# Patient Record
Sex: Female | Born: 1945 | Race: White | Hispanic: No | Smoking: Current every day smoker
Health system: Southern US, Community
[De-identification: ages and names within clinical notes are randomized; demographics above are authoritative.]

## PROBLEM LIST (undated history)

## (undated) DIAGNOSIS — M1710 Unilateral primary osteoarthritis, unspecified knee: Secondary | ICD-10-CM

## (undated) DIAGNOSIS — R51 Headache: Secondary | ICD-10-CM

## (undated) DIAGNOSIS — M179 Osteoarthritis of knee, unspecified: Secondary | ICD-10-CM

## (undated) DIAGNOSIS — C50919 Malignant neoplasm of unspecified site of unspecified female breast: Secondary | ICD-10-CM

## (undated) DIAGNOSIS — J189 Pneumonia, unspecified organism: Secondary | ICD-10-CM

## (undated) DIAGNOSIS — J449 Chronic obstructive pulmonary disease, unspecified: Secondary | ICD-10-CM

## (undated) DIAGNOSIS — L259 Unspecified contact dermatitis, unspecified cause: Secondary | ICD-10-CM

## (undated) DIAGNOSIS — R519 Headache, unspecified: Secondary | ICD-10-CM

## (undated) DIAGNOSIS — J439 Emphysema, unspecified: Secondary | ICD-10-CM

## (undated) DIAGNOSIS — Z9981 Dependence on supplemental oxygen: Secondary | ICD-10-CM

## (undated) HISTORY — PX: KNEE SURGERY: SHX244

## (undated) HISTORY — DX: Unspecified contact dermatitis, unspecified cause: L25.9

## (undated) HISTORY — PX: BREAST SURGERY: SHX581

## (undated) HISTORY — PX: COLONOSCOPY: SHX174

## (undated) HISTORY — DX: Malignant neoplasm of unspecified site of unspecified female breast: C50.919

## (undated) HISTORY — DX: Emphysema, unspecified: J43.9

## (undated) HISTORY — PX: EYE SURGERY: SHX253

## (undated) HISTORY — DX: Chronic obstructive pulmonary disease, unspecified: J44.9

## (undated) HISTORY — PX: TONSILLECTOMY: SUR1361

---

## 2016-09-27 DIAGNOSIS — J449 Chronic obstructive pulmonary disease, unspecified: Secondary | ICD-10-CM | POA: Diagnosis not present

## 2016-10-11 DIAGNOSIS — G4700 Insomnia, unspecified: Secondary | ICD-10-CM | POA: Diagnosis not present

## 2016-10-11 DIAGNOSIS — R600 Localized edema: Secondary | ICD-10-CM | POA: Diagnosis not present

## 2016-10-11 DIAGNOSIS — R202 Paresthesia of skin: Secondary | ICD-10-CM | POA: Diagnosis not present

## 2016-10-11 DIAGNOSIS — J449 Chronic obstructive pulmonary disease, unspecified: Secondary | ICD-10-CM | POA: Diagnosis not present

## 2016-10-11 DIAGNOSIS — L989 Disorder of the skin and subcutaneous tissue, unspecified: Secondary | ICD-10-CM | POA: Diagnosis not present

## 2016-10-13 ENCOUNTER — Other Ambulatory Visit (HOSPITAL_COMMUNITY): Payer: Self-pay | Admitting: Family Medicine

## 2016-10-13 DIAGNOSIS — IMO0002 Reserved for concepts with insufficient information to code with codable children: Secondary | ICD-10-CM

## 2016-10-13 DIAGNOSIS — R229 Localized swelling, mass and lump, unspecified: Principal | ICD-10-CM

## 2016-10-25 ENCOUNTER — Encounter (HOSPITAL_COMMUNITY): Payer: Self-pay

## 2016-10-25 ENCOUNTER — Ambulatory Visit (HOSPITAL_COMMUNITY)
Admission: RE | Admit: 2016-10-25 | Discharge: 2016-10-25 | Disposition: A | Discharge status: Home / Self Care | Payer: Medicare Other | Source: Ambulatory Visit | Attending: Family Medicine | Admitting: Family Medicine

## 2016-10-25 ENCOUNTER — Ambulatory Visit: Payer: Self-pay | Admitting: Family Medicine

## 2016-10-25 DIAGNOSIS — N6323 Unspecified lump in the left breast, lower outer quadrant: Secondary | ICD-10-CM | POA: Diagnosis not present

## 2016-10-25 DIAGNOSIS — R229 Localized swelling, mass and lump, unspecified: Secondary | ICD-10-CM

## 2016-10-25 DIAGNOSIS — N6310 Unspecified lump in the right breast, unspecified quadrant: Secondary | ICD-10-CM | POA: Diagnosis present

## 2016-10-25 DIAGNOSIS — R921 Mammographic calcification found on diagnostic imaging of breast: Secondary | ICD-10-CM | POA: Insufficient documentation

## 2016-10-25 DIAGNOSIS — R922 Inconclusive mammogram: Secondary | ICD-10-CM | POA: Diagnosis not present

## 2016-10-25 DIAGNOSIS — R928 Other abnormal and inconclusive findings on diagnostic imaging of breast: Secondary | ICD-10-CM | POA: Diagnosis present

## 2016-10-25 DIAGNOSIS — IMO0002 Reserved for concepts with insufficient information to code with codable children: Secondary | ICD-10-CM

## 2016-10-25 DIAGNOSIS — N6320 Unspecified lump in the left breast, unspecified quadrant: Secondary | ICD-10-CM | POA: Diagnosis present

## 2016-10-28 DIAGNOSIS — J449 Chronic obstructive pulmonary disease, unspecified: Secondary | ICD-10-CM | POA: Diagnosis not present

## 2016-11-01 ENCOUNTER — Encounter (HOSPITAL_COMMUNITY): Payer: Self-pay

## 2016-11-04 ENCOUNTER — Encounter: Payer: Self-pay | Admitting: Physician Assistant

## 2016-11-04 ENCOUNTER — Ambulatory Visit (INDEPENDENT_AMBULATORY_CARE_PROVIDER_SITE_OTHER): Payer: Medicare Other | Admitting: Physician Assistant

## 2016-11-04 VITALS — BP 133/83 | HR 76 | Temp 97.2°F | Ht 65.5 in | Wt 165.0 lb

## 2016-11-04 DIAGNOSIS — E7800 Pure hypercholesterolemia, unspecified: Secondary | ICD-10-CM | POA: Diagnosis not present

## 2016-11-04 DIAGNOSIS — Z720 Tobacco use: Secondary | ICD-10-CM

## 2016-11-04 DIAGNOSIS — R0902 Hypoxemia: Secondary | ICD-10-CM

## 2016-11-04 DIAGNOSIS — L2084 Intrinsic (allergic) eczema: Secondary | ICD-10-CM | POA: Diagnosis not present

## 2016-11-04 DIAGNOSIS — J449 Chronic obstructive pulmonary disease, unspecified: Secondary | ICD-10-CM | POA: Diagnosis not present

## 2016-11-04 NOTE — Patient Instructions (Signed)
In a few days you may receive a survey in the mail or online from [employer] regarding your visit with us today. Please take a moment to fill this out. Your feedback is very important to our whole office. It can help us better understand your needs as well as improve your experience and satisfaction. Thank you for taking your time to complete it. We care about you.  Elexius Minar, PA-C  

## 2016-11-08 ENCOUNTER — Encounter: Payer: Self-pay | Admitting: Physician Assistant

## 2016-11-08 DIAGNOSIS — J449 Chronic obstructive pulmonary disease, unspecified: Secondary | ICD-10-CM | POA: Insufficient documentation

## 2016-11-08 DIAGNOSIS — R0902 Hypoxemia: Secondary | ICD-10-CM | POA: Insufficient documentation

## 2016-11-08 DIAGNOSIS — E7800 Pure hypercholesterolemia, unspecified: Secondary | ICD-10-CM | POA: Insufficient documentation

## 2016-11-08 DIAGNOSIS — Z720 Tobacco use: Secondary | ICD-10-CM | POA: Insufficient documentation

## 2016-11-08 NOTE — Progress Notes (Signed)
BP 133/83   Pulse 76   Temp (!) 97.2 F (36.2 C) (Oral)   Ht 5' 5.5" (1.664 m)   Wt 165 lb (74.8 kg)   SpO2 93%   BMI 27.04 kg/m    Subjective:    Patient ID: Joanne Kramer, female    DOB: 1945/05/21, 71 y.o.   MRN: [id number]  HPI: Joanne Kramer is a 71 y.o. female presenting on 11/04/2016 for New Patient (Initial Visit)   this patient comes in to be established as a new patient. She is permanently moved to the area. Her past medical history is positive for COPD, hyperlipidemia, hypoxia with oxygen use at home, tobacco abuse,eczema. All of her medications are reviewed today. She reports that she had a colo at age 63. At this time she still smokes 1 pack per day. She is uncertain of her last pneumonia vaccine,  We will request records to be sent. She had cataract removal in 2018. She receives oxygen through [facility]. 6 months ago she had to have a right breast diagnostic mammo that was found to be normal. She was supposed to have it repeated in 6 months.  Relevant past medical, surgical, family and social history reviewed and updated as indicated. Allergies and medications reviewed and updated.  Past Medical History:  Diagnosis Date  . Cataract   . COPD (chronic obstructive pulmonary disease) ([place])   . Emphysema of lung [facility])     Past Surgical History:  Procedure Laterality Date  . CESAREAN SECTION    . EYE SURGERY    . KNEE SURGERY Right     Review of Systems  Constitutional: Positive for fatigue. Negative for activity change and fever.  HENT: Negative.   Eyes: Negative.   Respiratory: Positive for cough, shortness of breath and wheezing.   Cardiovascular: Negative.  Negative for chest pain, palpitations and leg swelling.  Gastrointestinal: Negative.  Negative for abdominal pain.  Endocrine: Negative.   Genitourinary: Negative.  Negative for dysuria.  Musculoskeletal: Negative.   Skin: Negative.   Neurological: Negative.     Allergies as of 11/04/2016   No Known  Allergies     Medication List       Accurate as of 11/04/16 11:59 PM. Always use your most recent med list.          ADVAIR DISKUS 250-50 MCG/DOSE Aepb Generic drug:  Fluticasone-Salmeterol Inhale 1 puff into the lungs 2 (two) times daily.   calcium carbonate 750 MG chewable tablet Commonly known as:  TUMS EX Chew 1 tablet by mouth daily.   COMBIVENT RESPIMAT 20-100 MCG/ACT Aers respimat Generic drug:  Ipratropium-Albuterol Inhale 1 puff into the lungs every 6 (six) hours.   Melatonin 5 MG Tabs Take by mouth.   multivitamin tablet Take 1 tablet by mouth daily.   naproxen sodium 220 MG tablet Commonly known as:  ANAPROX Take 220 mg by mouth 2 (two) times daily with a meal.   OVER THE COUNTER MEDICATION Calcium 1200 mg and Vitamin D3 1000 units daily   OXYGEN Inhale 2 L into the lungs at bedtime.   simvastatin 20 MG tablet Commonly known as:  ZOCOR Take 20 mg by mouth daily.   triamcinolone cream 0.1 % Commonly known as:  KENALOG Apply 1 application topically 2 (two) times daily.          Objective:    BP 133/83   Pulse 76   Temp (!) 97.2 F (36.2 C) (Oral)   Ht 5' 5.5" (1.664 m)  Wt 165 lb (74.8 kg)   SpO2 93%   BMI 27.04 kg/m   No Known Allergies  Physical Exam  Constitutional: She is oriented to person, place, and time. She appears well-developed and well-nourished.  HENT:  Head: Normocephalic and atraumatic.  Right Ear: Tympanic membrane, external ear and ear canal normal.  Left Ear: Tympanic membrane, external ear and ear canal normal.  Nose: Nose normal. No rhinorrhea.  Mouth/Throat: Oropharynx is clear and moist and mucous membranes are normal. No oropharyngeal exudate or posterior oropharyngeal erythema.  Eyes: Pupils are equal, round, and reactive to light. Conjunctivae and EOM are normal.  Neck: Normal range of motion. Neck supple.  Cardiovascular: Normal rate, regular rhythm, normal heart sounds and intact distal pulses.     Pulmonary/Chest: Effort normal and breath sounds normal.  Abdominal: Soft. Bowel sounds are normal.  Neurological: She is alert and oriented to person, place, and time. She has normal reflexes.  Skin: Skin is warm and dry. No rash noted.  Psychiatric: She has a normal mood and affect. Her behavior is normal. Judgment and thought content normal.  Nursing note and vitals reviewed.   No results found for this or any previous visit.    Assessment & Plan:   1. Chronic obstructive pulmonary disease, unspecified COPD type (HCC) - Fluticasone-Salmeterol (ADVAIR DISKUS) 250-50 MCG/DOSE AEPB; Inhale 1 puff into the lungs 2 (two) times daily. - Ipratropium-Albuterol (COMBIVENT RESPIMAT) 20-100 MCG/ACT AERS respimat; Inhale 1 puff into the lungs every 6 (six) hours. - OXYGEN; Inhale 2 L into the lungs at bedtime.  2. Pure hypercholesterolemia - simvastatin (ZOCOR) 20 MG tablet; Take 20 mg by mouth daily.  3. Hypoxia  4. Tobacco abuse  5. Intrinsic eczema - triamcinolone cream (KENALOG) 0.1 %; Apply 1 application topically 2 (two) times daily.    Current Outpatient Prescriptions:  .  calcium carbonate (TUMS EX) 750 MG chewable tablet, Chew 1 tablet by mouth daily., Disp: , Rfl:  .  Fluticasone-Salmeterol (ADVAIR DISKUS) 250-50 MCG/DOSE AEPB, Inhale 1 puff into the lungs 2 (two) times daily., Disp: , Rfl:  .  Ipratropium-Albuterol (COMBIVENT RESPIMAT) 20-100 MCG/ACT AERS respimat, Inhale 1 puff into the lungs every 6 (six) hours., Disp: , Rfl:  .  Melatonin 5 MG TABS, Take by mouth., Disp: , Rfl:  .  Multiple Vitamin (MULTIVITAMIN) tablet, Take 1 tablet by mouth daily., Disp: , Rfl:  .  naproxen sodium (ANAPROX) 220 MG tablet, Take 220 mg by mouth 2 (two) times daily with a meal., Disp: , Rfl:  .  OVER THE COUNTER MEDICATION, Calcium 1200 mg and Vitamin D3 1000 units daily, Disp: , Rfl:  .  OXYGEN, Inhale 2 L into the lungs at bedtime., Disp: , Rfl:  .  simvastatin (ZOCOR) 20 MG tablet,  Take 20 mg by mouth daily., Disp: , Rfl:  .  triamcinolone cream (KENALOG) 0.1 %, Apply 1 application topically 2 (two) times daily., Disp: , Rfl:  Continue all other maintenance medications as listed above.  Follow up plan: Return in about 3 months (around 02/03/2017) for recheck.  Educational handout given for [place] PA-C [facility] [address]  [place], [place] [address] [phone]   11/08/2016, 1:47 PM

## 2016-11-23 ENCOUNTER — Encounter: Payer: Self-pay | Admitting: *Deleted

## 2016-11-23 ENCOUNTER — Ambulatory Visit (INDEPENDENT_AMBULATORY_CARE_PROVIDER_SITE_OTHER): Payer: Medicare Other | Admitting: *Deleted

## 2016-11-23 DIAGNOSIS — Z23 Encounter for immunization: Secondary | ICD-10-CM | POA: Diagnosis not present

## 2016-11-23 DIAGNOSIS — Z0000 Encounter for general adult medical examination without abnormal findings: Secondary | ICD-10-CM

## 2016-11-23 NOTE — Progress Notes (Signed)
Subjective:   Joanne Kramer is a 71 y.o. female who presents for an Initial Medicare Annual Wellness Visit.  Joanne Kramer moved to [place] with her significant other from [place] 3 months ago to avoid the harsh winters.  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  She enjoys reading and working in her yard.  She has COPD and has not been able to do any exercise since moving here due to the heat and humidity.  She lives at home with her significant other. She has a daughter who lives in [place] and another daughter who lives in [place].   Review of Systems    All systems negative today        Objective:    Today's Vitals   11/23/16 1734  BP: 120/82  Pulse: 72  Weight: 165 lb (74.8 kg)  Height: 5' 5.5" (1.664 m)  PainSc: 0-No pain   Body mass index is 27.04 kg/m.   Current Medications (verified) Outpatient Encounter Prescriptions as of 11/23/2016  Medication Sig  . calcium carbonate (TUMS EX) 750 MG chewable tablet Chew 1 tablet by mouth daily.  . Fluticasone-Salmeterol (ADVAIR DISKUS) 250-50 MCG/DOSE AEPB Inhale 1 puff into the lungs 2 (two) times daily.  . Ipratropium-Albuterol (COMBIVENT RESPIMAT) 20-100 MCG/ACT AERS respimat Inhale 1 puff into the lungs every 6 (six) hours.  . Melatonin 5 MG TABS Take by mouth.  . Multiple Vitamin (MULTIVITAMIN) tablet Take 1 tablet by mouth daily.  . naproxen sodium (ANAPROX) 220 MG tablet Take 220 mg by mouth 2 (two) times daily with a meal.  . OVER THE COUNTER MEDICATION Calcium 1200 mg and Vitamin D3 1000 units daily  . OXYGEN Inhale 2 L into the lungs at bedtime.  . simvastatin (ZOCOR) 20 MG tablet Take 20 mg by mouth daily.  Marland Kitchen triamcinolone cream (KENALOG) 0.1 % Apply 1 application topically 2 (two) times daily.   No facility-administered encounter medications on file as of 11/23/2016.     Allergies (verified) Patient has no known allergies.   History: Past Medical History:  Diagnosis Date  .  Cataract   . COPD (chronic obstructive pulmonary disease) ([place])   . Emphysema of lung [facility])    Past Surgical History:  Procedure Laterality Date  . CESAREAN SECTION    . EYE SURGERY    . KNEE SURGERY Right    Family History  Problem Relation Age of Onset  . Adopted: Yes   Social History   Occupational History  . Not on file.   Social History Main Topics  . Smoking status: Current Some Day Smoker    Types: Cigarettes  . Smokeless tobacco: Never Used  . Alcohol use No  . Drug use: No  . Sexual activity: Not Currently      Activities of Daily Living In your present state of health, do you have any difficulty performing the following activities: 11/23/2016  Hearing? N  Vision? N  Difficulty concentrating or making decisions? N  Walking or climbing stairs? Y  Comment climbing stairs causes knee pain  Dressing or bathing? N  Doing errands, shopping? N    Immunizations and Health Maintenance Immunization History  Administered Date(s) Administered  . Influenza, High Dose Seasonal PF 11/23/2016   Health Maintenance Due  Topic Date Due  . Hepatitis C Screening  10/29/1945  . TETANUS/TDAP  05/08/1966  . COLONOSCOPY  05/07/1997  . DEXA SCAN  05/07/2012  . PNA vac Low Risk Adult (1 of  2 - PCV13) 05/07/2012  . INFLUENZA VACCINE  09/28/2016    Patient Care Team: Theodoro Clock as PCP - General (Physician Assistant)       Assessment:   This is a routine wellness examination for Joanne Kramer.   Hearing/Vision screen  Had bilateral cataracts removed this year plans to go to [facility] when eye exam is due No hearing deficits noted  Dietary issues and exercise activities discussed: Current Exercise Habits: The patient does not participate in regular exercise at present  Goals    . Exercise 3x per week (30 min per time)          Join [employer] or [employer] recreation department and work out at least 3 days per week for 30 minutes        Depression Screen PHQ 2/9 Scores 11/23/2016 11/04/2016  PHQ - 2 Score 0 0    Fall Risk Fall Risk  11/23/2016 11/04/2016  Falls in the past year? No No    Cognitive Function: MMSE - Mini Mental State Exam 11/23/2016  Orientation to time 5  Orientation to Place 5  Registration 3  Attention/ Calculation 5  Recall 3  Language- name 2 objects 2  Language- repeat 1  Language- follow 3 step command 3  Language- read & follow direction 1  Write a sentence 1  Copy design 1  Total score 30        Screening Tests Health Maintenance  Topic Date Due  . Hepatitis C Screening  04/16/1945  . TETANUS/TDAP  05/08/1966  . COLONOSCOPY  05/07/1997  . DEXA SCAN  05/07/2012  . PNA vac Low Risk Adult (1 of 2 - PCV13) 05/07/2012  . INFLUENZA VACCINE  09/28/2016  . MAMMOGRAM  10/26/2018   High Dose Influenza vaccine given today  Patient states she has requested for all of her medical records from [place] to be sent to our office.  Advised that once they are received they will be reviewed by Particia Nearing PA and sent to be scanning into her electronic medical record.    Plan:      Visit [place] [employer] or [employer] to find exercise groups - [employer] has a group called [place]   Work on increasing exercise to at least 3 times per week to 30 minutes per session - walking, riding stationary bike or recumbent stepper, or group exercise classes are good options  Eat a diet consisting mostly of lean proteins, vegetables, fruits, and whole grains.     I have personally reviewed and noted the following in the patient's chart:   . Medical and social history . Use of alcohol, tobacco or illicit drugs  . Current medications and supplements . Functional ability and status . Nutritional status . Physical activity . Advanced directives . List of other physicians . Hospitalizations, surgeries, and ER visits in previous 12 months . Vitals . Screenings to include cognitive,  depression, and falls . Referrals and appointments  In addition, I have reviewed and discussed with patient certain preventive protocols, quality metrics, and best practice recommendations. A written personalized care plan for preventive services as well as general preventive health recommendations were provided to patient.     WYATT, AMY M, RN   11/23/2016    I have reviewed and agree with the above AWV documentation.   Terald Sleeper PA-C [facility] [address]  [place], [place] [address] [phone]

## 2016-11-23 NOTE — Patient Instructions (Signed)
  Ms. Hulsey , Thank you for taking time to come for your Medicare Wellness Visit. I appreciate your ongoing commitment to your health goals. Please review the following plan we discussed and let me know if I can assist you in the future.   These are the goals we discussed:   Visit [place] [employer] or [employer] to find exercise groups - [employer] has a group called [employer]   Work on increasing exercise to at least 3 times per week to 30 minutes per session - walking, riding stationary bike or recumbent stepper are good options  I encourage you to eat a diet consisting mostly of lean proteins, vegetables, fruits, and whole grains.    Thank you for coming in for your annual wellness visit today!  Nolberto Hanlon, RN

## 2016-11-27 DIAGNOSIS — J449 Chronic obstructive pulmonary disease, unspecified: Secondary | ICD-10-CM | POA: Diagnosis not present

## 2016-12-28 DIAGNOSIS — J449 Chronic obstructive pulmonary disease, unspecified: Secondary | ICD-10-CM | POA: Diagnosis not present

## 2017-01-27 DIAGNOSIS — J449 Chronic obstructive pulmonary disease, unspecified: Secondary | ICD-10-CM | POA: Diagnosis not present

## 2017-02-06 ENCOUNTER — Ambulatory Visit: Payer: Medicare Other | Admitting: Physician Assistant

## 2017-02-15 ENCOUNTER — Ambulatory Visit (INDEPENDENT_AMBULATORY_CARE_PROVIDER_SITE_OTHER): Payer: Medicare Other | Admitting: Physician Assistant

## 2017-02-15 ENCOUNTER — Encounter: Payer: Self-pay | Admitting: Physician Assistant

## 2017-02-15 VITALS — BP 125/77 | HR 86 | Temp 97.4°F | Ht 65.5 in | Wt 166.2 lb

## 2017-02-15 DIAGNOSIS — R519 Headache, unspecified: Secondary | ICD-10-CM

## 2017-02-15 DIAGNOSIS — G5732 Lesion of lateral popliteal nerve, left lower limb: Secondary | ICD-10-CM | POA: Diagnosis not present

## 2017-02-15 DIAGNOSIS — J449 Chronic obstructive pulmonary disease, unspecified: Secondary | ICD-10-CM

## 2017-02-15 DIAGNOSIS — E7800 Pure hypercholesterolemia, unspecified: Secondary | ICD-10-CM | POA: Diagnosis not present

## 2017-02-15 DIAGNOSIS — M1711 Unilateral primary osteoarthritis, right knee: Secondary | ICD-10-CM | POA: Diagnosis not present

## 2017-02-15 DIAGNOSIS — M1712 Unilateral primary osteoarthritis, left knee: Secondary | ICD-10-CM

## 2017-02-15 DIAGNOSIS — R51 Headache: Secondary | ICD-10-CM

## 2017-02-15 DIAGNOSIS — Z0000 Encounter for general adult medical examination without abnormal findings: Secondary | ICD-10-CM

## 2017-02-15 DIAGNOSIS — M24541 Contracture, right hand: Secondary | ICD-10-CM

## 2017-02-15 NOTE — Patient Instructions (Signed)
Peroneal Tendinopathy Peroneal tendinopathy is irritation of the tendons that pass behind your ankle (peroneal tendons). These tendons attach muscles in your foot to a bone on the side of your foot and underneath the arch of your foot. This condition can cause your peroneal tendons to get bigger and swell. What are the causes? This condition may be caused by:  Putting stress on your ankle over and over again (overuse injury).  A sudden injury that puts stress on your tendons, such as an ankle sprain.  What increases the risk? This condition is more likely to develop in:  People who have high arches.  Athletes who play sports that involve putting stress on the ankle over and over again. These sports include: ? Running. ? Dancing. ? Soccer. ? Basketball.  What are the signs or symptoms? Symptoms of this condition can start suddenly or develop gradually. Symptoms include:  Pain in the back of the ankle, on the side of the foot, or in the arch of the foot.  Pain that gets worse with activity and better with rest.  Swelling.  Warmth.  Weakness in your foot or ankle.  How is this diagnosed? This condition may be diagnosed based on:  Your symptoms.  Your medical history.  A physical exam.  Imaging tests, such as: ? An X-ray or CT scan to check for bone injury. ? MRI or ultrasound to check for muscle or tendon injury.  During your physical exam, your health care provider may move your foot and ankle and test the strength of your leg muscles. How is this treated? This condition may be treated by:  Keeping your body weight off your ankle for several days.  Returning gradually to full activity gradually.  Putting ice on your ankle to reduce swelling.  Taking an anti-inflammatory pain medicine (NSAID).  Having medicine injected into your tendon to reduce swelling.  Wearing a removable boot or brace for ankle support.  Doing range-of-motion exercises and strengthening  exercises (physical therapy) when pain and swelling improve.  If the condition does not improve with treatment, or if a tendon or muscle is damaged, surgery may be needed. Follow these instructions at home: If you have a boot or brace:  Wear it as told by your health care provider. Remove it only as told by your health care provider.  Loosen it if your toes tingle, become numb, or turn cold and blue.  Do not let it get wet if it is not waterproof.  Keep it clean. Managing pain, stiffness, and swelling  If directed, apply ice to the injured area: ? Put ice in a plastic bag. ? Place a towel between your skin and the bag. ? Leave the ice on for 20 minutes, 2-3 times a day.  Take over-the-counter and prescription medicines only as told by your health care provider.  Raise (elevate) your ankle above the level of your heart when resting if you have swelling. Activity  Do not use your ankle to support (bear) your full body weight until your health care provider says that you can.  Do not do activities that make pain or swelling worse.  Return to your normal activities as told by your health care provider. General instructions  Keep all follow-up visits as told by your health care provider. This is important. How is this prevented?  Wear supportive footwear that is appropriate for your athletic activity.  Avoid athletic activities that cause swelling or pain in your ankle or foot.  See   your health care provider if you have pain or swelling that does not improve after a few days of rest.  Stop training if you develop pain or swelling.  If you start a new athletic activity, start gradually to build up your strength, endurance, and flexibility. Contact a health care provider if:  Your symptoms get worse.  Your symptoms do not improve in 2-4 weeks.  You develop new, unexplained symptoms. This information is not intended to replace advice given to you by your health care  provider. Make sure you discuss any questions you have with your health care provider. Document Released: 02/14/2005 Document Revised: 10/20/2015 Document Reviewed: 01/03/2015 Elsevier Interactive Patient Education  2018 Elsevier Inc.  

## 2017-02-15 NOTE — Progress Notes (Signed)
 BP 125/77   Pulse 86   Temp (!) 97.4 F (36.3 C) (Oral)   Ht 5' 5.5" (1.664 m)   Wt 166 lb 3.2 oz (75.4 kg)   BMI 27.24 kg/m    Subjective:    Patient ID: Joanne Kramer, female    DOB: 03/24/1945, 71 y.o.   MRN: [id number]  HPI: Joanne Kramer is a 71 y.o. female presenting on 02/15/2017 for Follow-up (3 month )  Comes in for a 3 month check.  She has complaints of her bilateral knees with known DJD.  She was told she needed replacement. Right is worse than left.  This document lab she also has more pain in the lateral portions of both lower legs.  She is hurting the most on the lateral portion of the left leg.  She does have a modified gait due to her knees being so painful.  The right knee does curve in significantly.  At times she even has pain in the IT band on the left side.  We have discussed orthopedic referral for her.  She also has COPD and has not had pulmonology set up since she has moved to our state.  We will plan to set this up.  She may be needing surgical clearance in the near future.  Relevant past medical, surgical, family and social history reviewed and updated as indicated. Allergies and medications reviewed and updated.  Past Medical History:  Diagnosis Date  . Cataract   . COPD (chronic obstructive pulmonary disease) (HCC)   . Emphysema of lung (HCC)     Past Surgical History:  Procedure Laterality Date  . CESAREAN SECTION    . EYE SURGERY    . KNEE SURGERY Right     Review of Systems  Constitutional: Negative.  Negative for activity change, fatigue and fever.  HENT: Negative.   Eyes: Negative.   Respiratory: Positive for cough, shortness of breath and wheezing.   Cardiovascular: Negative.  Negative for chest pain.  Gastrointestinal: Negative.  Negative for abdominal pain.  Endocrine: Negative.   Genitourinary: Negative.  Negative for dysuria.  Musculoskeletal: Positive for arthralgias, gait problem, joint swelling and myalgias.  Skin: Negative.      Allergies as of 02/15/2017   No Known Allergies     Medication List        Accurate as of 02/15/17  3:35 PM. Always use your most recent med list.          ADVAIR DISKUS 250-50 MCG/DOSE Aepb Generic drug:  Fluticasone-Salmeterol Inhale 1 puff into the lungs 2 (two) times daily.   calcium carbonate 750 MG chewable tablet Commonly known as:  TUMS EX Chew 1 tablet by mouth daily.   COMBIVENT RESPIMAT 20-100 MCG/ACT Aers respimat Generic drug:  Ipratropium-Albuterol Inhale 1 puff into the lungs every 6 (six) hours.   Melatonin 5 MG Tabs Take by mouth.   multivitamin tablet Take 1 tablet by mouth daily.   naproxen sodium 220 MG tablet Commonly known as:  ALEVE Take 220 mg by mouth 2 (two) times daily with a meal.   OVER THE COUNTER MEDICATION Calcium 1200 mg and Vitamin D3 1000 units daily   OXYGEN Inhale 2 L into the lungs at bedtime.   simvastatin 20 MG tablet Commonly known as:  ZOCOR Take 20 mg by mouth daily.   triamcinolone cream 0.1 % Commonly known as:  KENALOG Apply 1 application topically 2 (two) times daily.          Objective:      BP 125/77   Pulse 86   Temp (!) 97.4 F (36.3 C) (Oral)   Ht 5' 5.5" (1.664 m)   Wt 166 lb 3.2 oz (75.4 kg)   BMI 27.24 kg/m   No Known Allergies  Physical Exam  Constitutional: She is oriented to person, place, and time. She appears well-developed and well-nourished.  HENT:  Head: Normocephalic and atraumatic.  Right Ear: Tympanic membrane, external ear and ear canal normal.  Left Ear: Tympanic membrane, external ear and ear canal normal.  Nose: Nose normal. No rhinorrhea.  Mouth/Throat: Oropharynx is clear and moist and mucous membranes are normal. No oropharyngeal exudate or posterior oropharyngeal erythema.  Eyes: Conjunctivae and EOM are normal. Pupils are equal, round, and reactive to light.  Neck: Normal range of motion. Neck supple.  Cardiovascular: Normal rate, regular rhythm, normal heart sounds  and intact distal pulses.  Pulmonary/Chest: Effort normal and breath sounds normal.  Abdominal: Soft. Bowel sounds are normal.  Musculoskeletal:       Right knee: She exhibits decreased range of motion, swelling and deformity. Tenderness found.       Left knee: She exhibits decreased range of motion and deformity. Tenderness found.       Legs: Neurological: She is alert and oriented to person, place, and time. She has normal reflexes.  Skin: Skin is warm and dry. No rash noted.  Psychiatric: She has a normal mood and affect. Her behavior is normal. Judgment and thought content normal.    No results found for this or any previous visit.    Assessment & Plan:   1. Primary osteoarthritis of right knee  2. 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 Nonintractable episodic headache, unspecified headache type  5. Contracture of joint of finger of right hand  6. Chronic obstructive pulmonary disease, unspecified COPD type (HCC)  7. Well adult exam - CBC with Differential/Platelet - CMP14+EGFR - Lipid panel - TSH    Current Outpatient Medications:  .  calcium carbonate (TUMS EX) 750 MG chewable tablet, Chew 1 tablet by mouth daily., Disp: , Rfl:  .  Fluticasone-Salmeterol (ADVAIR DISKUS) 250-50 MCG/DOSE AEPB, Inhale 1 puff into the lungs 2 (two) times daily., Disp: , Rfl:  .  Ipratropium-Albuterol (COMBIVENT RESPIMAT) 20-100 MCG/ACT AERS respimat, Inhale 1 puff into the lungs every 6 (six) hours., Disp: , Rfl:  .  Melatonin 5 MG TABS, Take by mouth., Disp: , Rfl:  .  Multiple Vitamin (MULTIVITAMIN) tablet, Take 1 tablet by mouth daily., Disp: , Rfl:  .  naproxen sodium (ANAPROX) 220 MG tablet, Take 220 mg by mouth 2 (two) times daily with a meal., Disp: , Rfl:  .  OVER THE COUNTER MEDICATION, Calcium 1200 mg and Vitamin D3 1000 units daily, Disp: , Rfl:  .  OXYGEN, Inhale 2 L into the lungs at bedtime., Disp: , Rfl:  .  simvastatin (ZOCOR)  20 MG tablet, Take 20 mg by mouth daily., Disp: , Rfl:  .  triamcinolone cream (KENALOG) 0.1 %, Apply 1 application topically 2 (two) times daily., Disp: , Rfl:  Continue all other maintenance medications as listed above.  Follow up plan: Return in about 6 months (around 08/16/2017) for recheck.  Educational handout given for survey  Angel S. Jones PA-C [facility] [address]  [place], [place] [address] [phone]   02/15/2017, 3:35 PM   

## 2017-02-16 LAB — CMP14+EGFR
ALT: 13 IU/L (ref 0–32)
AST: 16 IU/L (ref 0–40)
Albumin/Globulin Ratio: 1.7 (ref 1.2–2.2)
Albumin: 4.3 g/dL (ref 3.6–4.8)
Alkaline Phosphatase: 76 IU/L (ref 39–117)
BUN/Creatinine Ratio: 18 (ref 12–28)
BUN: 13 mg/dL (ref 8–27)
Bilirubin Total: 0.3 mg/dL (ref 0.0–1.2)
CALCIUM: 9.3 mg/dL (ref 8.7–10.3)
CO2: 26 mmol/L (ref 20–29)
Chloride: 100 mmol/L (ref 96–106)
Creatinine, Ser: 0.71 mg/dL (ref 0.57–1.00)
GFR calc Af Amer: 100 mL/min/{1.73_m2} (ref >59)
GFR, EST NON AFRICAN AMERICAN: 87 mL/min/{1.73_m2} (ref >59)
GLUCOSE: 81 mg/dL (ref 65–99)
Globulin, Total: 2.5 g/dL (ref 1.5–4.5)
Potassium: 4.3 mmol/L (ref 3.5–5.2)
Sodium: 142 mmol/L (ref 134–144)
Total Protein: 6.8 g/dL (ref 6.0–8.5)

## 2017-02-16 LAB — CBC WITH DIFFERENTIAL/PLATELET
Basophils Absolute: 0 10*3/uL (ref 0.0–0.2)
Basos: 1 %
EOS (ABSOLUTE): 0.2 10*3/uL (ref 0.0–0.4)
EOS: 5 %
HEMATOCRIT: 43.2 % (ref 34.0–46.6)
Hemoglobin: 13.5 g/dL (ref 11.1–15.9)
IMMATURE GRANULOCYTES: 0 %
Immature Grans (Abs): 0 10*3/uL (ref 0.0–0.1)
Lymphocytes Absolute: 1.9 10*3/uL (ref 0.7–3.1)
Lymphs: 44 %
MCH: 30 pg (ref 26.6–33.0)
MCHC: 31.3 g/dL — ABNORMAL LOW (ref 31.5–35.7)
MCV: 96 fL (ref 79–97)
MONOS ABS: 0.4 10*3/uL (ref 0.1–0.9)
Monocytes: 9 %
NEUTROS PCT: 41 %
Neutrophils Absolute: 1.8 10*3/uL (ref 1.4–7.0)
Platelets: 175 10*3/uL (ref 150–379)
RBC: 4.5 x10E6/uL (ref 3.77–5.28)
RDW: 15.1 % (ref 12.3–15.4)
WBC: 4.4 10*3/uL (ref 3.4–10.8)

## 2017-02-16 LAB — TSH: TSH: 0.686 u[IU]/mL (ref 0.450–4.500)

## 2017-02-16 LAB — LIPID PANEL
CHOL/HDL RATIO: 2 ratio (ref 0.0–4.4)
Cholesterol, Total: 130 mg/dL (ref 100–199)
HDL: 65 mg/dL (ref >39)
LDL CALC: 48 mg/dL (ref 0–99)
TRIGLYCERIDES: 85 mg/dL (ref 0–149)
VLDL Cholesterol Cal: 17 mg/dL (ref 5–40)

## 2017-02-27 DIAGNOSIS — J449 Chronic obstructive pulmonary disease, unspecified: Secondary | ICD-10-CM | POA: Diagnosis not present

## 2017-03-10 ENCOUNTER — Other Ambulatory Visit: Payer: Self-pay | Admitting: Physician Assistant

## 2017-03-10 ENCOUNTER — Encounter (INDEPENDENT_AMBULATORY_CARE_PROVIDER_SITE_OTHER): Payer: Self-pay | Admitting: Orthopaedic Surgery

## 2017-03-10 ENCOUNTER — Ambulatory Visit (INDEPENDENT_AMBULATORY_CARE_PROVIDER_SITE_OTHER): Payer: Self-pay

## 2017-03-10 ENCOUNTER — Ambulatory Visit (INDEPENDENT_AMBULATORY_CARE_PROVIDER_SITE_OTHER): Payer: Medicare Other | Admitting: Orthopaedic Surgery

## 2017-03-10 VITALS — BP 124/80 | HR 76 | Ht 65.0 in | Wt 165.0 lb

## 2017-03-10 DIAGNOSIS — M1712 Unilateral primary osteoarthritis, left knee: Secondary | ICD-10-CM

## 2017-03-10 DIAGNOSIS — M1711 Unilateral primary osteoarthritis, right knee: Secondary | ICD-10-CM | POA: Diagnosis not present

## 2017-03-10 DIAGNOSIS — J449 Chronic obstructive pulmonary disease, unspecified: Secondary | ICD-10-CM

## 2017-03-10 NOTE — Progress Notes (Unsigned)
Left message for pt to contact office

## 2017-03-10 NOTE — Progress Notes (Signed)
Office Visit Note   Patient: Joanne Kramer           Date of Birth: 1945-06-17           MRN: [id number] Visit Date: 03/10/2017              Requested by: Terald Sleeper, PA-C [address] [place], [place] [address] PCP: Terald Sleeper, PA-C   Assessment & Plan: Visit Diagnoses:  1. Unilateral primary osteoarthritis, right knee   2. Unilateral primary osteoarthritis, left knee     Plan:  #1: Plan would be to proceed with a right total knee arthroplasty once clearances are obtained. #2: She will need clearance is not only by Particia Nearing PA-C as well as a pulmonologist. Once these are obtained and certainly proceed with scheduling her for total knee arthroplasty.  Follow-Up Instructions: Return if symptoms worsen or fail to improve.   Face-to-face time spent with patient was greater than 40 minutes.  Greater than 50% of the time was spent in counseling and coordination of care.  Orders:  Orders Placed This Encounter  Procedures  . XR Knee Complete 4 Views Left  . XR Knee Complete 4 Views Right   No orders of the defined types were placed in this encounter.     Procedures: No procedures performed   Clinical Data: No additional findings.   Subjective: Chief Complaint  Patient presents with  . New Patient (Initial Visit)    BIL LAT KNEE PAIN, R KNEE PAIN IS WORSE, OPERATED ON IN 1967    HPI  Joanne Kramer is a very pleasant 72 year old white female who presents today with bilateral knee pain right greater than left. She states in the 1966 she had had open procedure on her right knee. She has been quite active in her past and actually had been skiing and doing other activities. However she has not skied in the past 10-20 years.  She tries to be active but unfortunately she is now noting marked valgus deformity of the right knee with weightbearing. She has continued swelling in the knees right much greater than left. It is now getting to the point where she is having pain  with every step as well as nighttime pain. She has had Dosepak's for her pulmonary problems and states that there is really no benefit to her knee when she takes those. Seen today for evaluation.  Review of Systems  Constitutional: Positive for fever.  HENT: Negative.   Eyes: Negative.   Respiratory: Positive for cough and shortness of breath.   Cardiovascular: Negative.   Gastrointestinal: Negative.   Genitourinary: Negative.   Musculoskeletal: Positive for back pain, myalgias and neck pain.  Skin: Negative.   Neurological: Positive for weakness.  Psychiatric/Behavioral: Negative.      Objective: Vital Signs: BP 124/80 (BP Location: Left Arm, Patient Position: Sitting, Cuff Size: Normal)   Pulse 76   Ht 5\' 5"  (1.651 m)   Wt 165 lb (74.8 kg)   BMI 27.46 kg/m   Physical Exam  Constitutional: She is oriented to person, place, and time. She appears well-developed and well-nourished.  HENT:  Head: Normocephalic and atraumatic.  Eyes: EOM are normal. Pupils are equal, round, and reactive to light.  Pulmonary/Chest: Effort normal.  Neurological: She is alert and oriented to person, place, and time.  Skin: Skin is warm and dry.  Psychiatric: She has a normal mood and affect. Her behavior is normal. Judgment and thought content normal.    Ortho  Exam  Her right knee today reveals a about the 12 of valgus deformity. She does have a moderate effusion. No warmth. I am able to correct her to a near neutral. She does a crepitance with range of motion. She lacks fusion at degrees shy of full extension and I can get her to about 95.  The left knee reveals a mild effusion. No warmth. She has about 75 of valgus positioning. Crepitance with range of motion. Range of motion near full extension to 105.   Specialty Comments:  No specialty comments available.  Imaging: Xr Knee Complete 4 Views Left  Result Date: 03/10/2017 4 view x-ray of the left knee reveals still maintained joint  spaces. There is some periarticular spurring more medially than laterally. Sclerosing of the medial tibial plateau noted.  Xr Knee Complete 4 Views Right  Result Date: 03/10/2017 4 view x-ray of the right knee reveals marked valgus deformity. She essentially bone-on-bone with some erosion and deterioration of the lateral femoral condyle. March sclerosing the lateral femoral condyle and lateral tibial plateau. Trigger spurring more on the lateral aspect of the femur and tibia. Medially there is some erosion of the medial femoral condyle left. There is some translation of the distal femur medially on the tibial plateau.    PMFS History: Patient Active Problem List   Diagnosis Date Noted  . Primary osteoarthritis of right knee 02/15/2017  . Primary osteoarthritis of left knee 02/15/2017  . 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 Nonintractable episodic headache 02/15/2017  . Chronic obstructive pulmonary disease ([place]) 11/08/2016  . Pure hypercholesterolemia 11/08/2016  . Hypoxia 11/08/2016  . Tobacco abuse 11/08/2016   Past Medical History:  Diagnosis Date  . Cataract   . COPD (chronic obstructive pulmonary disease) ([place])   . Emphysema of lung ([place])     Family History  Adopted: Yes    Past Surgical History:  Procedure Laterality Date  . CESAREAN SECTION    . EYE SURGERY    . KNEE SURGERY Right    Social History   Occupational History  . Not on file  Tobacco Use  . Smoking status: Current Some Day Smoker    Types: Cigarettes  . Smokeless tobacco: Never Used  Substance and Sexual Activity  . Alcohol use: No  . Drug use: No  . Sexual activity: Not Currently

## 2017-03-22 DIAGNOSIS — J9611 Chronic respiratory failure with hypoxia: Secondary | ICD-10-CM | POA: Diagnosis not present

## 2017-03-22 DIAGNOSIS — M25561 Pain in right knee: Secondary | ICD-10-CM | POA: Diagnosis not present

## 2017-03-22 DIAGNOSIS — J449 Chronic obstructive pulmonary disease, unspecified: Secondary | ICD-10-CM | POA: Diagnosis not present

## 2017-03-23 ENCOUNTER — Ambulatory Visit: Payer: Medicare Other | Admitting: Internal Medicine

## 2017-03-30 DIAGNOSIS — J449 Chronic obstructive pulmonary disease, unspecified: Secondary | ICD-10-CM | POA: Diagnosis not present

## 2017-04-27 DIAGNOSIS — J449 Chronic obstructive pulmonary disease, unspecified: Secondary | ICD-10-CM | POA: Diagnosis not present

## 2017-05-19 DIAGNOSIS — J449 Chronic obstructive pulmonary disease, unspecified: Secondary | ICD-10-CM | POA: Diagnosis not present

## 2017-05-19 DIAGNOSIS — M13869 Other specified arthritis, unspecified knee: Secondary | ICD-10-CM | POA: Diagnosis not present

## 2017-05-22 ENCOUNTER — Ambulatory Visit (INDEPENDENT_AMBULATORY_CARE_PROVIDER_SITE_OTHER): Payer: Medicare Other

## 2017-05-22 ENCOUNTER — Ambulatory Visit (INDEPENDENT_AMBULATORY_CARE_PROVIDER_SITE_OTHER): Payer: Medicare Other | Admitting: Physician Assistant

## 2017-05-22 ENCOUNTER — Encounter: Payer: Self-pay | Admitting: Physician Assistant

## 2017-05-22 VITALS — BP 123/75 | HR 77 | Temp 97.3°F | Ht 65.0 in | Wt 180.8 lb

## 2017-05-22 DIAGNOSIS — J449 Chronic obstructive pulmonary disease, unspecified: Secondary | ICD-10-CM | POA: Diagnosis not present

## 2017-05-22 DIAGNOSIS — Z01818 Encounter for other preprocedural examination: Secondary | ICD-10-CM

## 2017-05-22 DIAGNOSIS — J984 Other disorders of lung: Secondary | ICD-10-CM | POA: Diagnosis not present

## 2017-05-22 NOTE — Patient Instructions (Signed)
In a few days you may receive a survey in the mail or online from [employer] regarding your visit with us today. Please take a moment to fill this out. Your feedback is very important to our whole office. It can help us better understand your needs as well as improve your experience and satisfaction. Thank you for taking your time to complete it. We care about you.  Lida Berkery, PA-C  

## 2017-05-22 NOTE — Progress Notes (Signed)
BP 123/75   Pulse 77   Temp (!) 97.3 F (36.3 C) (Oral)   Ht '5\' 5"'  (1.651 m)   Wt 180 lb 12.8 oz (82 kg)   SpO2 96%   BMI 30.09 kg/m    Subjective:    Patient ID: Joanne Kramer, female    DOB: December 31, 1945, 72 y.o.   MRN: [id number]  HPI: Joanne Kramer is a 72 y.o. female presenting on 05/22/2017 for Surgical Clearance (Seen Pulmonologist on Friday )  Patient comes in today for a preop evaluation for surgery.  She is looking at a total knee replacement with Dr. Durward Fortes.  She was seen by pulmonology last week.  This was a follow-up.  She reports that she is doing better.  She was has been able to quit smoking.  She is still using her inhaled medications with good control.  She denies any chest pain.  She is able to climb a flight of stairs without problem.  She is able to walk 10 minutes without stopping.  Past Medical History:  Diagnosis Date  . Cataract   . COPD (chronic obstructive pulmonary disease) ([place])   . Emphysema of lung ([place])    Relevant past medical, surgical, family and social history reviewed and updated as indicated. Interim medical history since our last visit reviewed. Allergies and medications reviewed and updated. DATA REVIEWED: CHART IN EPIC  Family History reviewed for pertinent findings.  Review of Systems  Constitutional: Negative.  Negative for activity change, fatigue and fever.  HENT: Negative.   Eyes: Negative.   Respiratory: Positive for cough and wheezing. Negative for shortness of breath.   Cardiovascular: Negative.  Negative for chest pain.  Gastrointestinal: Negative.  Negative for abdominal pain.  Endocrine: Negative.   Genitourinary: Negative.  Negative for dysuria.  Musculoskeletal: Negative.   Skin: Negative.   Neurological: Negative.     Allergies as of 05/22/2017   No Known Allergies     Medication List        Accurate as of 05/22/17  3:32 PM. Always use your most recent med list.          ADVAIR DISKUS 250-50 MCG/DOSE  Aepb Generic drug:  Fluticasone-Salmeterol Inhale 1 puff into the lungs 2 (two) times daily.   calcium carbonate 750 MG chewable tablet Commonly known as:  TUMS EX Chew 1 tablet by mouth daily.   COMBIVENT RESPIMAT 20-100 MCG/ACT Aers respimat Generic drug:  Ipratropium-Albuterol Inhale 1 puff into the lungs every 6 (six) hours.   Melatonin 5 MG Tabs Take by mouth.   multivitamin tablet Take 1 tablet by mouth daily.   naproxen sodium 220 MG tablet Commonly known as:  ALEVE Take 220 mg by mouth 2 (two) times daily with a meal.   OVER THE COUNTER MEDICATION Calcium 1200 mg and Vitamin D3 1000 units daily   OXYGEN Inhale 2 L into the lungs at bedtime.   simvastatin 20 MG tablet Commonly known as:  ZOCOR Take 20 mg by mouth daily.   triamcinolone cream 0.1 % Commonly known as:  KENALOG Apply 1 application topically 2 (two) times daily.      Recent Results (from the past 2160 hour(s))  CBC with Differential/Platelet     Status: Abnormal   Collection Time: 05/22/17  4:03 PM  Result Value Ref Range   WBC 5.7 3.4 - 10.8 x10E3/uL   RBC 4.35 3.77 - 5.28 x10E6/uL   Hemoglobin 12.9 11.1 - 15.9 g/dL   Hematocrit 41.6 34.0 - 46.6 %  MCV 96 79 - 97 fL   MCH 29.7 26.6 - 33.0 pg   MCHC 31.0 (L) 31.5 - 35.7 g/dL   RDW 14.8 12.3 - 15.4 %   Platelets 207 150 - 379 x10E3/uL   Neutrophils 38 Not Estab. %   Lymphs 49 Not Estab. %   Monocytes 8 Not Estab. %   Eos 4 Not Estab. %   Basos 1 Not Estab. %   Neutrophils Absolute 2.2 1.4 - 7.0 x10E3/uL   Lymphocytes Absolute 2.8 0.7 - 3.1 x10E3/uL   Monocytes Absolute 0.5 0.1 - 0.9 x10E3/uL   EOS (ABSOLUTE) 0.2 0.0 - 0.4 x10E3/uL   Basophils Absolute 0.1 0.0 - 0.2 x10E3/uL   Immature Granulocytes 0 Not Estab. %   Immature Grans (Abs) 0.0 0.0 - 0.1 x10E3/uL  CMP14+EGFR     Status: None   Collection Time: 05/22/17  4:03 PM  Result Value Ref Range   Glucose 75 65 - 99 mg/dL   BUN 16 8 - 27 mg/dL   Creatinine, Ser 0.67 0.57 - 1.00  mg/dL   GFR calc non Af Amer 89 >59 mL/min/1.73   GFR calc Af Amer 103 >59 mL/min/1.73   BUN/Creatinine Ratio 24 12 - 28   Sodium 143 134 - 144 mmol/L   Potassium 4.3 3.5 - 5.2 mmol/L   Chloride 102 96 - 106 mmol/L   CO2 28 20 - 29 mmol/L   Calcium 9.2 8.7 - 10.3 mg/dL   Total Protein 6.6 6.0 - 8.5 g/dL   Albumin 4.1 3.5 - 4.8 g/dL   Globulin, Total 2.5 1.5 - 4.5 g/dL   Albumin/Globulin Ratio 1.6 1.2 - 2.2   Bilirubin Total 0.2 0.0 - 1.2 mg/dL   Alkaline Phosphatase 82 39 - 117 IU/L   AST 12 0 - 40 IU/L   ALT 8 0 - 32 IU/L  TSH     Status: None   Collection Time: 05/22/17  4:03 PM  Result Value Ref Range   TSH 0.738 0.450 - 4.500 uIU/mL       Objective:    BP 123/75   Pulse 77   Temp (!) 97.3 F (36.3 C) (Oral)   Ht '5\' 5"'  (1.651 m)   Wt 180 lb 12.8 oz (82 kg)   SpO2 96%   BMI 30.09 kg/m   No Known Allergies  Wt Readings from Last 3 Encounters:  05/22/17 180 lb 12.8 oz (82 kg)  03/10/17 165 lb (74.8 kg)  02/15/17 166 lb 3.2 oz (75.4 kg)    Physical Exam  Constitutional: She is oriented to person, place, and time. She appears well-developed and well-nourished.  HENT:  Head: Normocephalic and atraumatic.  Right Ear: Tympanic membrane, external ear and ear canal normal.  Left Ear: Tympanic membrane, external ear and ear canal normal.  Nose: Nose normal. No rhinorrhea.  Mouth/Throat: Oropharynx is clear and moist and mucous membranes are normal. No oropharyngeal exudate or posterior oropharyngeal erythema.  Eyes: Pupils are equal, round, and reactive to light. Conjunctivae and EOM are normal.  Neck: Normal range of motion. Neck supple.  Cardiovascular: Normal rate, regular rhythm, normal heart sounds and intact distal pulses.  Pulmonary/Chest: Effort normal and breath sounds normal.  Abdominal: Soft. Bowel sounds are normal.  Neurological: She is alert and oriented to person, place, and time. She has normal reflexes.  Skin: Skin is warm and dry. No rash noted.    Psychiatric: She has a normal mood and affect. Her behavior is normal. Judgment and  thought content normal.        Assessment & Plan:   1. Chronic obstructive pulmonary disease, unspecified COPD type ([place]) Cleared by Dr. Georgiann Cocker, pulmonology,last week  2. Pre-op evaluation - DG Chest 2 View; Future - EKG 12-Lead    RSR changes, plan cardio eval for final okay. - CBC with Differential/Platelet - CMP14+EGFR - TSH   Continue all other maintenance medications as listed above.  Follow up plan: No follow-ups on file.  Educational handout given for [place] PA-C [facility] [address]  [place], [place] [address] [phone]   05/22/2017, 3:32 PM

## 2017-05-23 LAB — CBC WITH DIFFERENTIAL/PLATELET
Basophils Absolute: 0.1 10*3/uL (ref 0.0–0.2)
Basos: 1 %
EOS (ABSOLUTE): 0.2 10*3/uL (ref 0.0–0.4)
EOS: 4 %
Hematocrit: 41.6 % (ref 34.0–46.6)
Hemoglobin: 12.9 g/dL (ref 11.1–15.9)
IMMATURE GRANS (ABS): 0 10*3/uL (ref 0.0–0.1)
IMMATURE GRANULOCYTES: 0 %
LYMPHS: 49 %
Lymphocytes Absolute: 2.8 10*3/uL (ref 0.7–3.1)
MCH: 29.7 pg (ref 26.6–33.0)
MCHC: 31 g/dL — ABNORMAL LOW (ref 31.5–35.7)
MCV: 96 fL (ref 79–97)
MONOS ABS: 0.5 10*3/uL (ref 0.1–0.9)
Monocytes: 8 %
NEUTROS PCT: 38 %
Neutrophils Absolute: 2.2 10*3/uL (ref 1.4–7.0)
PLATELETS: 207 10*3/uL (ref 150–379)
RBC: 4.35 x10E6/uL (ref 3.77–5.28)
RDW: 14.8 % (ref 12.3–15.4)
WBC: 5.7 10*3/uL (ref 3.4–10.8)

## 2017-05-23 LAB — CMP14+EGFR
ALT: 8 IU/L (ref 0–32)
AST: 12 IU/L (ref 0–40)
Albumin/Globulin Ratio: 1.6 (ref 1.2–2.2)
Albumin: 4.1 g/dL (ref 3.5–4.8)
Alkaline Phosphatase: 82 IU/L (ref 39–117)
BUN/Creatinine Ratio: 24 (ref 12–28)
BUN: 16 mg/dL (ref 8–27)
Bilirubin Total: 0.2 mg/dL (ref 0.0–1.2)
CALCIUM: 9.2 mg/dL (ref 8.7–10.3)
CO2: 28 mmol/L (ref 20–29)
Chloride: 102 mmol/L (ref 96–106)
Creatinine, Ser: 0.67 mg/dL (ref 0.57–1.00)
GFR calc Af Amer: 103 mL/min/{1.73_m2} (ref >59)
GFR, EST NON AFRICAN AMERICAN: 89 mL/min/{1.73_m2} (ref >59)
Globulin, Total: 2.5 g/dL (ref 1.5–4.5)
Glucose: 75 mg/dL (ref 65–99)
Potassium: 4.3 mmol/L (ref 3.5–5.2)
Sodium: 143 mmol/L (ref 134–144)
Total Protein: 6.6 g/dL (ref 6.0–8.5)

## 2017-05-23 LAB — TSH: TSH: 0.738 u[IU]/mL (ref 0.450–4.500)

## 2017-05-28 DIAGNOSIS — J449 Chronic obstructive pulmonary disease, unspecified: Secondary | ICD-10-CM | POA: Diagnosis not present

## 2017-06-13 ENCOUNTER — Other Ambulatory Visit: Payer: Self-pay

## 2017-06-13 ENCOUNTER — Telehealth: Payer: Self-pay | Admitting: Cardiovascular Disease

## 2017-06-13 ENCOUNTER — Ambulatory Visit: Payer: Medicare Other | Admitting: Cardiovascular Disease

## 2017-06-13 ENCOUNTER — Encounter: Payer: Self-pay | Admitting: Cardiovascular Disease

## 2017-06-13 ENCOUNTER — Encounter: Payer: Self-pay | Admitting: *Deleted

## 2017-06-13 VITALS — BP 123/75 | HR 99 | Ht 66.0 in | Wt 186.0 lb

## 2017-06-13 DIAGNOSIS — R9431 Abnormal electrocardiogram [ECG] [EKG]: Secondary | ICD-10-CM | POA: Diagnosis not present

## 2017-06-13 DIAGNOSIS — R0609 Other forms of dyspnea: Secondary | ICD-10-CM | POA: Diagnosis not present

## 2017-06-13 DIAGNOSIS — Z01818 Encounter for other preprocedural examination: Secondary | ICD-10-CM

## 2017-06-13 DIAGNOSIS — J449 Chronic obstructive pulmonary disease, unspecified: Secondary | ICD-10-CM | POA: Diagnosis not present

## 2017-06-13 NOTE — Telephone Encounter (Signed)
Pre-cert Verification for the following procedure   Lexiscan scheduled for 06/16/2017 at [facility]

## 2017-06-13 NOTE — Progress Notes (Signed)
CARDIOLOGY CONSULT NOTE  Patient ID: Joanne Kramer MRN: [id number] DOB/AGE: 05-17-1945 72 y.o.  Admit date: (Not on file) Primary Physician: Joanne Sleeper, PA-C Referring Physician: Terald Sleeper, PA-C  Reason for Consultation: Preoperative risk stratification  HPI: Joanne Kramer is a 72 y.o. female who is being seen today for the evaluation of preoperative risk stratification at the request of Joanne Sleeper, PA-C.   She saw her PCP on 05/22/17.  I reviewed documentation in the electronic medical record.  It appears she is being scheduled to undergo right total knee replacement on 06/27/17.  I reviewed a chest x-ray performed on 05/23/17 which demonstrated atherosclerotic calcifications in the aortic arch, hyperexpanded lungs suggestive of COPD, and mild degenerative changes in the thoracic spine.  I personally reviewed the ECG performed on 05/22/17 which demonstrated sinus rhythm with low voltage, right axis deviation, and an incomplete right bundle branch block.  She has chronic exertional dyspnea due to COPD.  She denies exertional chest pain.  Her breathing becomes worse when there is more humidity.  She has tried to quit smoking altogether and did so on 05/05/17.  Prior to that she smoked anywhere from 1-2 packs/day for at least 71 years.  She denies a history of myocardial infarction.  I reviewed lipids performed on 02/15/17: Total cholesterol 130, triglycerides 85, HDL 65, LDL 48.  Fam Hx: She is adopted and thus it is unclear what her family history is.  Social history: She is recently from [place].  She moved to [place] from [place].  She has a daughter in [place] and another daughter in [place].  She has a boyfriend.   No Known Allergies  Current Outpatient Medications  Medication Sig Dispense Refill  . Calcium Carb-Cholecalciferol (CALCIUM 600+D3 PO) Take 1 tablet by mouth daily.    . calcium carbonate (TUMS EX) 750 MG chewable tablet Chew  1-2 tablets by mouth 3 (three) times daily as needed (for heartburn/indigestion.).     Marland Kitchen Fluticasone-Salmeterol (ADVAIR DISKUS) 250-50 MCG/DOSE AEPB Inhale 1 puff into the lungs 2 (two) times daily.    . Ipratropium-Albuterol (COMBIVENT RESPIMAT) 20-100 MCG/ACT AERS respimat Inhale 1 puff into the lungs every 4 (four) hours as needed ((typically 4x's daily)).     . Melatonin 5 MG TABS Take 5 mg by mouth at bedtime as needed (for sleep.).     Marland Kitchen Multiple Vitamin (MULTIVITAMIN WITH MINERALS) TABS tablet Take 1 tablet by mouth daily.    . naproxen sodium (ANAPROX) 220 MG tablet Take 440 mg by mouth 2 (two) times daily as needed (for pain.).     Marland Kitchen OXYGEN Inhale 2 L into the lungs at bedtime.    . simvastatin (ZOCOR) 20 MG tablet Take 20 mg by mouth daily with supper.     . triamcinolone cream (KENALOG) 0.1 % Apply 1 application topically 2 (two) times daily as needed (for skin irritation/contact dermatitis).      No current facility-administered medications for this visit.     Past Medical History:  Diagnosis Date  . Cataract   . COPD (chronic obstructive pulmonary disease) ([place])   . Emphysema of lung [facility])     Past Surgical History:  Procedure Laterality Date  . CESAREAN SECTION    . EYE SURGERY    . KNEE SURGERY Right     Social History   Socioeconomic History  . Marital status: Single    Spouse name: Not on file  . Number of  children: Not on file  . Years of education: Not on file  . Highest education level: Not on file  Occupational History  . Not on file  Social Needs  . Financial resource strain: Not on file  . Food insecurity:    Worry: Not on file    Inability: Not on file  . Transportation needs:    Medical: Not on file    Non-medical: Not on file  Tobacco Use  . Smoking status: Former Smoker    Types: Cigarettes    Last attempt to quit: 05/07/2017    Years since quitting: 0.1  . Smokeless tobacco: Never Used  Substance and Sexual Activity  . Alcohol use: No  .  Drug use: No  . Sexual activity: Not Currently  Lifestyle  . Physical activity:    Days per week: Not on file    Minutes per session: Not on file  . Stress: Not on file  Relationships  . Social connections:    Talks on phone: Not on file    Gets together: Not on file    Attends religious service: Not on file    Active member of club or organization: Not on file    Attends meetings of clubs or organizations: Not on file    Relationship status: Not on file  . Intimate partner violence:    Fear of current or ex partner: Not on file    Emotionally abused: Not on file    Physically abused: Not on file    Forced sexual activity: Not on file  Other Topics Concern  . Not on file  Social History Narrative  . Not on file      Current Meds  Medication Sig  . Calcium Carb-Cholecalciferol (CALCIUM 600+D3 PO) Take 1 tablet by mouth daily.  . calcium carbonate (TUMS EX) 750 MG chewable tablet Chew 1-2 tablets by mouth 3 (three) times daily as needed (for heartburn/indigestion.).   Marland Kitchen Fluticasone-Salmeterol (ADVAIR DISKUS) 250-50 MCG/DOSE AEPB Inhale 1 puff into the lungs 2 (two) times daily.  . Ipratropium-Albuterol (COMBIVENT RESPIMAT) 20-100 MCG/ACT AERS respimat Inhale 1 puff into the lungs every 4 (four) hours as needed ((typically 4x's daily)).   . Melatonin 5 MG TABS Take 5 mg by mouth at bedtime as needed (for sleep.).   Marland Kitchen Multiple Vitamin (MULTIVITAMIN WITH MINERALS) TABS tablet Take 1 tablet by mouth daily.  . naproxen sodium (ANAPROX) 220 MG tablet Take 440 mg by mouth 2 (two) times daily as needed (for pain.).   Marland Kitchen OXYGEN Inhale 2 L into the lungs at bedtime.  . simvastatin (ZOCOR) 20 MG tablet Take 20 mg by mouth daily with supper.   . triamcinolone cream (KENALOG) 0.1 % Apply 1 application topically 2 (two) times daily as needed (for skin irritation/contact dermatitis).       Review of systems complete and found to be negative unless listed above in HPI    Physical  exam Blood pressure 123/75, pulse 99, height 5\' 6"  (1.676 m), weight 186 lb (84.4 kg), SpO2 90 %. General: NAD Neck: No JVD, no thyromegaly or thyroid nodule.  Lungs: Clear to auscultation bilaterally with normal respiratory effort. CV: Nondisplaced PMI. Regular rate and rhythm, normal S1/S2, no S3/S4, no murmur.  No peripheral edema.  No carotid bruit.   Abdomen: Soft, nontender, no distention.  Skin: Intact without lesions or rashes.  Neurologic: Alert and oriented x 3.  Psych: Normal affect. Extremities: No clubbing or cyanosis.  HEENT: Normal.   ECG:  Most recent ECG reviewed.   Labs: Lab Results  Component Value Date/Time   K 4.3 05/22/2017 04:03 PM   BUN 16 05/22/2017 04:03 PM   CREATININE 0.67 05/22/2017 04:03 PM   ALT 8 05/22/2017 04:03 PM   TSH 0.738 05/22/2017 04:03 PM   HGB 12.9 05/22/2017 04:03 PM     Lipids: Lab Results  Component Value Date/Time   LDLCALC 48 02/15/2017 12:49 PM   CHOL 130 02/15/2017 12:49 PM   TRIG 85 02/15/2017 12:49 PM   HDL 65 02/15/2017 12:49 PM        ASSESSMENT AND PLAN:   1.  Preoperative risk stratification/abnormal ECG with exertional dyspnea: Her abnormal ECG could be explained by COPD with right axis deviation and incomplete right bundle branch block.  She was adopted so it is unclear what her family history is.  She does have a long history of tobacco abuse.  In order to more accurately provide risk stratification, I will obtain a Lexiscan Myoview stress test to evaluate for ischemic heart disease.    Disposition: Follow up to be determined  Signed: Kate Sable, M.D., F.A.C.C.  06/13/2017, 1:58 PM

## 2017-06-13 NOTE — Patient Instructions (Signed)
Your physician recommends that you schedule a follow-up appointment in: TO [place]  Your physician recommends that you continue on your current medications as directed. Please refer to the Current Medication list given to you today.  Your physician has requested that you have a lexiscan myoview. For further information please visit [email or web address]. Please follow instruction sheet, as given.  Thank you for choosing [facility]!!

## 2017-06-14 ENCOUNTER — Telehealth (INDEPENDENT_AMBULATORY_CARE_PROVIDER_SITE_OTHER): Payer: Self-pay | Admitting: Orthopaedic Surgery

## 2017-06-14 NOTE — Telephone Encounter (Signed)
Should not have an affect on her blood work.  But she can ask the cardiologist on the day of her test to confirm it.Marland KitchenMarland KitchenThanks

## 2017-06-14 NOTE — Telephone Encounter (Signed)
Patient is scheduled with Aaron Edelman for H&P on 06/21/17 and R-TKA is scheduled for 06/27/17.    Patient is calling because she is going for a Lexiscan on 4/19 (this Friday at 7am).  She went to see her cardiologist yesterday for cardiac clearance. Patient states that she is scheduled on the same day for preadmission testing. She is worried that the "radioactive tracers" from having the scan can or will affect the blood work being done later in the day.  She called Pre-admission testing but said they could not give her an answer and told her to call Dr. Durward Fortes.  Please advise   Patient's call back # is [phone]    Please see his Assessment and Plan (from 06/13/17 visit with Dr. Jacinta Shoe)  1.  Preoperative risk stratification/abnormal ECG with exertional dyspnea: Her abnormal ECG could be explained by COPD with right axis deviation and incomplete right bundle branch block.  She was adopted so it is unclear what her family history is.  She does have a long history of tobacco abuse.  In order to more accurately provide risk stratification, I will obtain a Lexiscan Myoview stress test to evaluate for ischemic heart disease.

## 2017-06-15 ENCOUNTER — Encounter (HOSPITAL_COMMUNITY): Payer: Medicare Other

## 2017-06-15 NOTE — Telephone Encounter (Signed)
Notified pt it should not affect her blood work.

## 2017-06-15 NOTE — Pre-Procedure Instructions (Signed)
Joanne Kramer  06/15/2017      [id number] [place] [place] - [address] [place] [address] Phone: [phone] Fax: [phone]  [facility] - [place], [place] [address] Phone: [phone] Fax: [phone]    Your procedure is scheduled on 06/27/2017.  Report to [facility] Admitting at [facility].  Call this number if you have problems the morning of surgery:  [phone]   Remember:  Do not eat food or drink liquids after midnight.   Continue all medications as directed by your physician except follow these medication instructions before surgery below   Take these medicines the morning of surgery with A SIP OF WATER: Fluticasone-Salmeterol (Advair diskus) Ipratropium-Albuterol (combivent Respimat) - if needed  7 days prior to surgery STOP taking any Aspirin (unless otherwise instructed by your surgeon), Aleve, Naproxen, Ibuprofen, Motrin, Advil, Goody's, BC's, all herbal medications, fish oil, and all vitamins    Do not wear jewelry, make-up or nail polish.  Do not wear lotions, powders, or perfumes, or deodorant.  Do not shave 48 hours prior to surgery.    Do not bring valuables to the hospital.  [facility] is not responsible for any belongings or valuables.  Hearing aids, eyeglasses, contacts, dentures or bridgework may not be worn into surgery.  Leave your suitcase in the car.  After surgery it may be brought to your room.  For patients admitted to the hospital, discharge time will be determined by your treatment team.  Patients discharged the day of surgery will not be allowed to drive home.   Name and phone number of your driver:    Special instructions:   [facility]- Preparing For Surgery  Before surgery, you can play an important role. Because skin is not sterile, your skin needs to be as free of germs as possible. You can reduce the  number of germs on your skin by washing with CHG (chlorahexidine gluconate) Soap before surgery.  CHG is an antiseptic cleaner which kills germs and bonds with the skin to continue killing germs even after washing.  Please do not use if you have an allergy to CHG or antibacterial soaps. If your skin becomes reddened/irritated stop using the CHG.  Do not shave (including legs and underarms) for at least 48 hours prior to first CHG shower. It is OK to shave your face.  Please follow these instructions carefully.   1. Shower the NIGHT BEFORE SURGERY and the MORNING OF SURGERY with CHG.   2. If you chose to wash your hair, wash your hair first as usual with your normal shampoo.  3. After you shampoo, rinse your hair and body thoroughly to remove the shampoo.  4. Use CHG as you would any other liquid soap. You can apply CHG directly to the skin and wash gently with a scrungie or a clean washcloth.   5. Apply the CHG Soap to your body ONLY FROM THE NECK DOWN.  Do not use on open wounds or open sores. Avoid contact with your eyes, ears, mouth and genitals (private parts). Wash Face and genitals (private parts)  with your normal soap.  6. Wash thoroughly, paying special attention to the area where your surgery will be performed.  7. Thoroughly rinse your body with warm water from the neck down.  8. DO NOT shower/wash with your normal soap after using and rinsing off the CHG  Soap.  9. Pat yourself dry with a CLEAN TOWEL.  10. Wear CLEAN PAJAMAS to bed the night before surgery, wear comfortable clothes the morning of surgery  11. Place CLEAN SHEETS on your bed the night of your first shower and DO NOT SLEEP WITH PETS.    Day of Surgery: Shower as stated above. Do not apply any deodorants/lotions. Please wear clean clothes to the hospital/surgery center.      Please read over the following fact sheets that you were given.

## 2017-06-16 ENCOUNTER — Encounter (HOSPITAL_BASED_OUTPATIENT_CLINIC_OR_DEPARTMENT_OTHER)
Admission: RE | Admit: 2017-06-16 | Discharge: 2017-06-16 | Disposition: A | Discharge status: Home / Self Care | Payer: Medicare Other | Source: Ambulatory Visit | Attending: Cardiovascular Disease | Admitting: Cardiovascular Disease

## 2017-06-16 ENCOUNTER — Encounter (HOSPITAL_COMMUNITY): Payer: Self-pay

## 2017-06-16 ENCOUNTER — Encounter (HOSPITAL_COMMUNITY)
Admission: RE | Admit: 2017-06-16 | Discharge: 2017-06-16 | Disposition: A | Discharge status: Home / Self Care | Payer: Medicare Other | Source: Ambulatory Visit | Attending: Orthopaedic Surgery | Admitting: Orthopaedic Surgery

## 2017-06-16 ENCOUNTER — Other Ambulatory Visit: Payer: Self-pay

## 2017-06-16 ENCOUNTER — Encounter (HOSPITAL_COMMUNITY)
Admission: RE | Admit: 2017-06-16 | Discharge: 2017-06-16 | Disposition: A | Discharge status: Home / Self Care | Payer: Medicare Other | Source: Ambulatory Visit | Attending: Cardiovascular Disease | Admitting: Cardiovascular Disease

## 2017-06-16 DIAGNOSIS — Z01812 Encounter for preprocedural laboratory examination: Secondary | ICD-10-CM | POA: Diagnosis not present

## 2017-06-16 DIAGNOSIS — Z79899 Other long term (current) drug therapy: Secondary | ICD-10-CM | POA: Insufficient documentation

## 2017-06-16 DIAGNOSIS — R0609 Other forms of dyspnea: Secondary | ICD-10-CM | POA: Diagnosis not present

## 2017-06-16 DIAGNOSIS — Z01810 Encounter for preprocedural cardiovascular examination: Secondary | ICD-10-CM | POA: Insufficient documentation

## 2017-06-16 DIAGNOSIS — Z87891 Personal history of nicotine dependence: Secondary | ICD-10-CM | POA: Diagnosis not present

## 2017-06-16 DIAGNOSIS — J449 Chronic obstructive pulmonary disease, unspecified: Secondary | ICD-10-CM | POA: Insufficient documentation

## 2017-06-16 HISTORY — DX: Headache, unspecified: R51.9

## 2017-06-16 HISTORY — DX: Headache: R51

## 2017-06-16 HISTORY — DX: Dependence on supplemental oxygen: Z99.81

## 2017-06-16 HISTORY — DX: Osteoarthritis of knee, unspecified: M17.9

## 2017-06-16 HISTORY — DX: Unilateral primary osteoarthritis, unspecified knee: M17.10

## 2017-06-16 HISTORY — DX: Pneumonia, unspecified organism: J18.9

## 2017-06-16 LAB — COMPREHENSIVE METABOLIC PANEL
ALBUMIN: 3.8 g/dL (ref 3.5–5.0)
ALK PHOS: 79 U/L (ref 38–126)
ALT: 11 U/L — AB (ref 14–54)
ANION GAP: 9 (ref 5–15)
AST: 13 U/L — ABNORMAL LOW (ref 15–41)
BILIRUBIN TOTAL: 0.6 mg/dL (ref 0.3–1.2)
BUN: 9 mg/dL (ref 6–20)
CALCIUM: 9.2 mg/dL (ref 8.9–10.3)
CO2: 28 mmol/L (ref 22–32)
CREATININE: 0.55 mg/dL (ref 0.44–1.00)
Chloride: 103 mmol/L (ref 101–111)
GFR calc non Af Amer: >60 mL/min (ref >60)
GLUCOSE: 78 mg/dL (ref 65–99)
Potassium: 4 mmol/L (ref 3.5–5.1)
SODIUM: 140 mmol/L (ref 135–145)
TOTAL PROTEIN: 7.1 g/dL (ref 6.5–8.1)

## 2017-06-16 LAB — SURGICAL PCR SCREEN
MRSA, PCR: NEGATIVE
Staphylococcus aureus: POSITIVE — AB

## 2017-06-16 LAB — URINALYSIS, ROUTINE W REFLEX MICROSCOPIC
Bacteria, UA: NONE SEEN
Bilirubin Urine: NEGATIVE
Glucose, UA: NEGATIVE mg/dL
HGB URINE DIPSTICK: NEGATIVE
Ketones, ur: NEGATIVE mg/dL
Nitrite: NEGATIVE
PROTEIN: NEGATIVE mg/dL
Specific Gravity, Urine: 1.006 (ref 1.005–1.030)
pH: 7 (ref 5.0–8.0)

## 2017-06-16 LAB — NM MYOCAR MULTI W/SPECT W/WALL MOTION / EF
CHL CUP NUCLEAR SDS: 7
CHL CUP NUCLEAR SSS: 8
CHL CUP RESTING HR STRESS: 82 {beats}/min
CSEPPHR: 103 {beats}/min
LV dias vol: 111 mL (ref 46–106)
LV sys vol: 45 mL
RATE: 0.49
SRS: 1
TID: 1.28

## 2017-06-16 LAB — CBC WITH DIFFERENTIAL/PLATELET
Basophils Absolute: 0 10*3/uL (ref 0.0–0.1)
Basophils Relative: 0 %
Eosinophils Absolute: 0.1 10*3/uL (ref 0.0–0.7)
Eosinophils Relative: 2 %
HEMATOCRIT: 42.1 % (ref 36.0–46.0)
HEMOGLOBIN: 13.1 g/dL (ref 12.0–15.0)
LYMPHS ABS: 2.2 10*3/uL (ref 0.7–4.0)
Lymphocytes Relative: 23 %
MCH: 30 pg (ref 26.0–34.0)
MCHC: 31.1 g/dL (ref 30.0–36.0)
MCV: 96.3 fL (ref 78.0–100.0)
MONOS PCT: 8 %
Monocytes Absolute: 0.7 10*3/uL (ref 0.1–1.0)
NEUTROS ABS: 6.2 10*3/uL (ref 1.7–7.7)
NEUTROS PCT: 67 %
Platelets: 163 10*3/uL (ref 150–400)
RBC: 4.37 MIL/uL (ref 3.87–5.11)
RDW: 14.5 % (ref 11.5–15.5)
WBC: 9.3 10*3/uL (ref 4.0–10.5)

## 2017-06-16 LAB — TYPE AND SCREEN
ABO/RH(D): O POS
Antibody Screen: NEGATIVE

## 2017-06-16 LAB — APTT: aPTT: 28 seconds (ref 24–36)

## 2017-06-16 LAB — PROTIME-INR
INR: 1.08
Prothrombin Time: 13.9 seconds (ref 11.4–15.2)

## 2017-06-16 LAB — ABO/RH: ABO/RH(D): O POS

## 2017-06-16 MED ORDER — SODIUM CHLORIDE 0.9% FLUSH
INTRAVENOUS | Status: AC
  Filled 2017-06-16: qty 160

## 2017-06-16 MED ORDER — SODIUM CHLORIDE 0.9% FLUSH
INTRAVENOUS | Status: AC
  Administered 2017-06-16: 10 mL via INTRAVENOUS
  Filled 2017-06-16: qty 10

## 2017-06-16 MED ORDER — TECHNETIUM TC 99M TETROFOSMIN IV KIT
30.0000 | PACK | Freq: Once | INTRAVENOUS | Status: AC | PRN
Start: 2017-06-16 — End: 2017-06-16
  Administered 2017-06-16: 32.8 via INTRAVENOUS

## 2017-06-16 MED ORDER — REGADENOSON 0.4 MG/5ML IV SOLN
INTRAVENOUS | Status: AC
  Administered 2017-06-16: 0.4 mg via INTRAVENOUS
  Filled 2017-06-16: qty 5

## 2017-06-16 MED ORDER — TECHNETIUM TC 99M TETROFOSMIN IV KIT
10.0000 | PACK | Freq: Once | INTRAVENOUS | Status: AC | PRN
  Administered 2017-06-16: 11 via INTRAVENOUS

## 2017-06-16 NOTE — Progress Notes (Signed)
Pt denies any acute cardiopulmonary issues. Pt under the care of Dr. Bronson Ing, Cardiology.  Pt denies having a cardiac cath and echo. Pt chart forwarded to anesthesia (awaiting results of stress test done today for cardiac clearance).

## 2017-06-16 NOTE — Pre-Procedure Instructions (Addendum)
Joanne Kramer  06/16/2017      [id number] [place] [place] - [address] [place] [address] Phone: [phone] Fax: [phone]  [facility] - [place], [place] [address] Phone: [phone] Fax: [phone]    Your procedure is scheduled on Tuesday, June 27, 2017  Report to [facility] Admitting at 5:30 A.M.  Call this number if you have problems the morning of surgery:  [phone]   Remember:  Do not eat food or drink liquids after midnight Monday, June 26, 2017   Continue all medications as directed by your physician except follow these medication instructions before surgery below   Take these medicines the morning of surgery with A SIP OF WATER: Fluticasone-Salmeterol (Advair diskus) Ipratropium-Albuterol (combivent Respimat) - if needed (Bring inhaler in with you on day of surgery) 7 days prior to surgery STOP taking any Aspirin (unless otherwise instructed by your surgeon), Aleve, Naproxen (Anaprox), Ibuprofen, Motrin, Advil, Goody's, BC's,  Melatonin, all herbal medications, fish oil, and all vitamins   Do not wear jewelry, make-up or nail polish.  Do not wear lotions, powders, or perfumes, or deodorant.  Do not shave 48 hours prior to surgery.    Do not bring valuables to the hospital.  [facility] is not responsible for any belongings or valuables.  Hearing aids, eyeglasses, contacts, dentures or bridgework may not be worn into surgery.  Leave your suitcase in the car.  After surgery it may be brought to your room.  For patients admitted to the hospital, discharge time will be determined by your treatment team.  Special instructions:   [facility]- Preparing For Surgery  Before surgery, you can play an important role. Because skin is not sterile, your skin needs to be as free of germs as possible. You can reduce the number of germs on  your skin by washing with CHG (chlorahexidine gluconate) Soap before surgery.  CHG is an antiseptic cleaner which kills germs and bonds with the skin to continue killing germs even after washing.  Please do not use if you have an allergy to CHG or antibacterial soaps. If your skin becomes reddened/irritated stop using the CHG.  Do not shave (including legs and underarms) for at least 48 hours prior to first CHG shower. It is OK to shave your face.  Please follow these instructions carefully.   1. Shower the NIGHT BEFORE SURGERY and the MORNING OF SURGERY with CHG.   2. If you chose to wash your hair, wash your hair first as usual with your normal shampoo.  3. After you shampoo, rinse your hair and body thoroughly to remove the shampoo.  4. Use CHG as you would any other liquid soap. You can apply CHG directly to the skin and wash gently with a scrungie or a clean washcloth.   5. Apply the CHG Soap to your body ONLY FROM THE NECK DOWN.  Do not use on open wounds or open sores. Avoid contact with your eyes, ears, mouth and genitals (private parts). Wash Face and genitals (private parts)  with your normal soap.  6. Wash thoroughly, paying special attention to the area where your surgery will be performed.  7. Thoroughly rinse your body with warm water from the neck down.  8. DO NOT shower/wash with your normal soap after using and rinsing off the CHG Soap.  9. Pat yourself dry with a  CLEAN TOWEL.  10. Wear CLEAN PAJAMAS to bed the night before surgery, wear comfortable clothes the morning of surgery  11. Place CLEAN SHEETS on your bed the night of your first shower and DO NOT SLEEP WITH PETS.    Day of Surgery: Shower as stated above. Do not apply any deodorants/lotions. Please wear clean clothes to the hospital/surgery center.      Please read over the following fact sheets that you were given.

## 2017-06-17 LAB — URINE CULTURE: Culture: NO GROWTH

## 2017-06-19 ENCOUNTER — Encounter: Payer: Self-pay | Admitting: Family Medicine

## 2017-06-19 ENCOUNTER — Ambulatory Visit (INDEPENDENT_AMBULATORY_CARE_PROVIDER_SITE_OTHER): Payer: Medicare Other | Admitting: Family Medicine

## 2017-06-19 VITALS — BP 121/78 | HR 104 | Temp 97.3°F | Ht 66.0 in | Wt 181.2 lb

## 2017-06-19 DIAGNOSIS — J441 Chronic obstructive pulmonary disease with (acute) exacerbation: Secondary | ICD-10-CM

## 2017-06-19 DIAGNOSIS — J0100 Acute maxillary sinusitis, unspecified: Secondary | ICD-10-CM | POA: Diagnosis not present

## 2017-06-19 MED ORDER — PREDNISONE 20 MG PO TABS: 40.0000 mg | ORAL_TABLET | Freq: Every day | ORAL | 0 refills | Status: DC

## 2017-06-19 MED ORDER — AMOXICILLIN-POT CLAVULANATE 875-125 MG PO TABS: 1.0000 | ORAL_TABLET | Freq: Two times a day (BID) | ORAL | 0 refills | Status: DC

## 2017-06-19 NOTE — Progress Notes (Signed)
Anesthesia Chart Review:   Case:  [id number] Date/Time:  06/27/17 0700   Procedure:  RIGHT TOTAL KNEE ARTHROPLASTY (Right )   Anesthesia type:  Choice   Pre-op diagnosis:  OSTEOARTHRITIS RIGHT KNEE   Location:  [facility] OR ROOM 07 / [facility] OR   Surgeon:  Garald Balding, MD      DISCUSSION: Pt is a 72 year old female.  Had normal nuclear stress test 06/16/17. Uses O2 for COPD. Has pulmonary clearance for surgery at "higher than average risk because of her COPD"   VS: BP 102/67   Pulse 89   Temp 36.8 C (Oral)   Resp 18   Wt 181 lb 4 oz (82.2 kg)   SpO2 93%   BMI 29.25 kg/m   PROVIDERS: PCP is Terald Sleeper, PA-C  Pulmonologist is Sinda Du, MD who cleared pt for surgery (at higher risk due to COPD) at last office visit 05/19/17  Saw cardiologist Kate Sable, MD for pre-op eval 06/13/17. Stress test ordered, results below.    LABS: Labs reviewed: Acceptable for surgery. (all labs ordered are listed, but only abnormal results are displayed)  Labs Reviewed  SURGICAL PCR SCREEN - Abnormal; Notable for the following components:      Result Value   Staphylococcus aureus POSITIVE (*)    All other components within normal limits  COMPREHENSIVE METABOLIC PANEL - Abnormal; Notable for the following components:   AST 13 (*)    ALT 11 (*)    All other components within normal limits  URINALYSIS, ROUTINE W REFLEX MICROSCOPIC - Abnormal; Notable for the following components:   Leukocytes, UA TRACE (*)    Squamous Epithelial / LPF 0-5 (*)    All other components within normal limits  URINE CULTURE  APTT  CBC WITH DIFFERENTIAL/PLATELET  PROTIME-INR  TYPE AND SCREEN  ABO/RH     IMAGES:  CXR 05/23/17:  1. No acute abnormality. 2. The lungs are hyperexpanded, suggesting COPD. 3. Aortic atherosclerosis.   EKG 05/22/17: Sinus  Rhythm. 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 Low voltage with rightward P-axis and rotation -possible pulmonary  disease.    CV:  Nuclear stress test 06/16/17:   There was no ST segment deviation noted during stress.  The study is normal. There are no perfusion defects.  This is a low risk study.  The left ventricular ejection fraction is normal (55-65%).    Past Medical History:  Diagnosis Date  . Cataract   . COPD (chronic obstructive pulmonary disease) ([place])   . Emphysema of lung ([place])   . Headache   . OA (osteoarthritis) of knee    right  . Pneumonia   . Requires supplemental oxygen     Past Surgical History:  Procedure Laterality Date  . BREAST SURGERY     left breast aspiration  . CESAREAN SECTION    . COLONOSCOPY    . EYE SURGERY    . KNEE SURGERY Right   . TONSILLECTOMY      MEDICATIONS: . Calcium Carb-Cholecalciferol (CALCIUM 600+D3 PO)  . calcium carbonate (TUMS EX) 750 MG chewable tablet  . Fluticasone-Salmeterol (ADVAIR DISKUS) 250-50 MCG/DOSE AEPB  . Ipratropium-Albuterol (COMBIVENT RESPIMAT) 20-100 MCG/ACT AERS respimat  . Melatonin 5 MG TABS  . Multiple Vitamin (MULTIVITAMIN WITH MINERALS) TABS tablet  . naproxen sodium (ANAPROX) 220 MG tablet  . OXYGEN  . simvastatin (ZOCOR) 20 MG tablet  . triamcinolone cream (KENALOG) 0.1 %   No current facility-administered medications for  this encounter.     If no changes, I anticipate pt can proceed with surgery as scheduled.   Willeen Cass, FNP-BC [facility]/Anesthesiology Phone: [phone] 06/22/2017 10:53 AM

## 2017-06-19 NOTE — Progress Notes (Signed)
   HPI  Patient presents today with cough and shortness of breath.  Patient explains she has had 6 to 7 days of cough productive of yellow sputum, shortness of breath, headache in the frontal and maxillary sinus area, and nasal congestion.  She is tolerating food and fluids like usual. She states that she wears oxygen at home, 2 L by nasal cannula, only at night. Has not felt the need for any in the daytime.   PMH: Smoking status noted ROS: Per HPI  Objective: BP 121/78   Pulse (!) 104   Temp (!) 97.3 F (36.3 C) (Oral)   Ht 5\' 6"  (1.676 m)   Wt 181 lb 3.2 oz (82.2 kg)   SpO2 90%   BMI 29.25 kg/m  Gen: NAD, alert, cooperative with exam HEENT: NCAT, tenderness to palpation of bilateral maxillary and frontal sinuses, left maxillary is the worst, oropharynx moist and clear CV: RRR, good S1/S2, no murmur Resp: CTABL, no wheezes, non-labored Abd: SNTND, BS present, no guarding or organomegaly Ext: No edema, warm Neuro: Alert and oriented, No gross deficits  Assessment and plan:  #COPD exacerbation, acute maxillary sinusitis Treat with prednisone plus Augmentin Dicussed if not back to baseline let her surgeon know, she will still be on antibiotics so would recommend informing them regardless.     Meds ordered this encounter  Medications  . predniSONE (DELTASONE) 20 MG tablet    Sig: Take 2 tablets (40 mg total) by mouth daily with breakfast.    Dispense:  10 tablet    Refill:  0  . amoxicillin-clavulanate (AUGMENTIN) 875-125 MG tablet    Sig: Take 1 tablet by mouth 2 (two) times daily.    Dispense:  20 tablet    Refill:  0    Laroy Apple, MD [facility] 06/19/2017, 3:12 PM

## 2017-06-19 NOTE — Patient Instructions (Signed)
Great to see you!   Sinusitis, Adult Sinusitis is soreness and inflammation of your sinuses. Sinuses are hollow spaces in the bones around your face. They are located:  Around your eyes.  In the middle of your forehead.  Behind your nose.  In your cheekbones.  Your sinuses and nasal passages are lined with a stringy fluid (mucus). Mucus normally drains out of your sinuses. When your nasal tissues get inflamed or swollen, the mucus can get trapped or blocked so air cannot flow through your sinuses. This lets bacteria, viruses, and funguses grow, and that leads to infection. Follow these instructions at home: Medicines  Take, use, or apply over-the-counter and prescription medicines only as told by your doctor. These may include nasal sprays.  If you were prescribed an antibiotic medicine, take it as told by your doctor. Do not stop taking the antibiotic even if you start to feel better. Hydrate and Humidify  Drink enough water to keep your pee (urine) clear or pale yellow.  Use a cool mist humidifier to keep the humidity level in your home above 50%.  Breathe in steam for 10-15 minutes, 3-4 times a day or as told by your doctor. You can do this in the bathroom while a hot shower is running.  Try not to spend time in cool or dry air. Rest  Rest as much as possible.  Sleep with your head raised (elevated).  Make sure to get enough sleep each night. General instructions  Put a warm, moist washcloth on your face 3-4 times a day or as told by your doctor. This will help with discomfort.  Wash your hands often with soap and water. If there is no soap and water, use hand sanitizer.  Do not smoke. Avoid being around people who are smoking (secondhand smoke).  Keep all follow-up visits as told by your doctor. This is important. Contact a doctor if:  You have a fever.  Your symptoms get worse.  Your symptoms do not get better within 10 days. Get help right away if:  You  have a very bad headache.  You cannot stop throwing up (vomiting).  You have pain or swelling around your face or eyes.  You have trouble seeing.  You feel confused.  Your neck is stiff.  You have trouble breathing. This information is not intended to replace advice given to you by your health care provider. Make sure you discuss any questions you have with your health care provider. Document Released: 08/03/2007 Document Revised: 10/11/2015 Document Reviewed: 12/10/2014 Elsevier Interactive Patient Education  2018 Elsevier Inc.  

## 2017-06-20 ENCOUNTER — Other Ambulatory Visit (INDEPENDENT_AMBULATORY_CARE_PROVIDER_SITE_OTHER): Payer: Self-pay

## 2017-06-20 ENCOUNTER — Encounter (HOSPITAL_COMMUNITY): Payer: Medicare Other

## 2017-06-20 NOTE — H&P (Addendum)
TOTAL KNEE ADMISSION H&P  Patient is being admitted for right total knee arthroplasty.  Subjective:  Chief Complaint:right knee pain.  HPI: Joanne Kramer, 72 y.o. female, has a history of pain and functional disability in the right knee due to arthritis and has failed non-surgical conservative treatments for greater than 12 weeks to includeNSAID's and/or analgesics, weight reduction as appropriate and activity modification.  Onset of symptoms was gradual, starting >10 years ago with gradually worsening course since that time. The patient noted prior procedures on the knee to include  arthroscopy and menisectomy on the right knee(s).  Patient currently rates pain in the right knee(s) at 5 out of 10 with activity. Patient has night pain, worsening of pain with activity and weight bearing and crepitus.  Patient has evidence of subchondral cysts, subchondral sclerosis, periarticular osteophytes, joint subluxation and joint space narrowing by imaging studies. There is no active infection.  Patient Active Problem List   Diagnosis Date Noted  . Primary osteoarthritis of right knee 02/15/2017  . Primary osteoarthritis of left knee 02/15/2017  . 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 Nonintractable episodic headache 02/15/2017  . Chronic obstructive pulmonary disease ([place]) 11/08/2016  . Pure hypercholesterolemia 11/08/2016  . Hypoxia 11/08/2016  . Tobacco abuse 11/08/2016   Past Medical History:  Diagnosis Date  . Cataract   . COPD (chronic obstructive pulmonary disease) ([place])   . Emphysema of lung ([place])   . Headache   . OA (osteoarthritis) of knee    right  . Pneumonia   . Requires supplemental oxygen     Past Surgical History:  Procedure Laterality Date  . BREAST SURGERY     left breast aspiration  . CESAREAN SECTION    . COLONOSCOPY    . EYE SURGERY    . KNEE SURGERY Right   . TONSILLECTOMY      No current facility-administered medications for this encounter.     Current Outpatient Medications  Medication Sig Dispense Refill Last Dose  . Calcium Carb-Cholecalciferol (CALCIUM 600+D3 PO) Take 1 tablet by mouth daily.   Not Taking  . calcium carbonate (TUMS EX) 750 MG chewable tablet Chew 1-2 tablets by mouth 3 (three) times daily as needed (for heartburn/indigestion.).    Not Taking  . Fluticasone-Salmeterol (ADVAIR DISKUS) 250-50 MCG/DOSE AEPB Inhale 1 puff into the lungs 2 (two) times daily.   Taking  . Ipratropium-Albuterol (COMBIVENT RESPIMAT) 20-100 MCG/ACT AERS respimat Inhale 1 puff into the lungs every 4 (four) hours as needed ((typically 4x's daily)).    Taking  . Melatonin 5 MG TABS Take 5 mg by mouth at bedtime as needed (for sleep.).    Not Taking  . Multiple Vitamin (MULTIVITAMIN WITH MINERALS) TABS tablet Take 1 tablet by mouth daily.   Not Taking  . naproxen sodium (ANAPROX) 220 MG tablet Take 440 mg by mouth 2 (two) times daily as needed (for pain.).    Not Taking  . OXYGEN Inhale 2 L into the lungs at bedtime.   Taking  . simvastatin (ZOCOR) 20 MG tablet Take 20 mg by mouth daily with supper.    Taking  . triamcinolone cream (KENALOG) 0.1 % Apply 1 application topically 2 (two) times daily as needed (for skin irritation/contact dermatitis).    Taking  . amoxicillin-clavulanate (AUGMENTIN) 875-125 MG tablet Take 1 tablet by mouth 2 (two) times daily. 20 tablet 0 Taking  . predniSONE (DELTASONE) 20 MG tablet Take 2 tablets (40 mg total) by mouth daily with breakfast.  10 tablet 0 Taking   No Known Allergies  Social History   Tobacco Use  . Smoking status: Former Smoker    Packs/day: 1.00    Years: 55.00    Pack years: 55.00    Types: Cigarettes    Last attempt to quit: 05/07/2017    Years since quitting: 0.1  . Smokeless tobacco: Never Used  Substance Use Topics  . Alcohol use: No    Family History  Adopted: Yes     ROS  Review of Systems  Constitutional: Positive for fatigue.  HENT: Negative for trouble swallowing.    Eyes: Negative for pain.  Respiratory: Positive for cough and shortness of breath.   Cardiovascular: Negative for leg swelling.  Gastrointestinal: Negative for constipation.  Endocrine: Negative for cold intolerance.  Genitourinary: Negative for difficulty urinating.  Musculoskeletal: Positive for joint swelling.  Skin: Positive for rash.  Allergic/Immunologic: Negative for food allergies.  Neurological: Positive for weakness.  Hematological: Bruises/bleeds easily.  Psychiatric/Behavioral: Positive for sleep disturbance.   Objective:  Physical Exam  Constitutional: She is oriented to person, place, and time. She appears well-developed and well-nourished.  HENT:  Head: Normocephalic and atraumatic.  Mouth/Throat: Oropharynx is clear and moist.  Eyes: Pupils are equal, round, and reactive to light. Conjunctivae and EOM are normal.  Neck: Neck supple. No thyromegaly present.  Cardiovascular: Normal rate, regular rhythm, normal heart sounds and intact distal pulses.  No murmur heard. Respiratory: Effort normal. She has wheezes.  Decreased breath sounds bilaterally with occasional wheezing  GI: Soft. Bowel sounds are normal. There is no tenderness.  Neurological: She is alert and oriented to person, place, and time.  Skin: Skin is warm and dry.  Psychiatric: She has a normal mood and affect. Her behavior is normal. Judgment and thought content normal.  Musculoskeletal: Right knee reveals approximately 12 degrees of valgus deformity.  Mild to moderate effusion.  Crepitance with range of motion.  Range of motion 5 degrees to 105 degrees.  Vital signs in last 24 hours: Temp:  [98.6 F (37 C)] 98.6 F (37 C) (04/24 1038) Pulse Rate:  [94] 94 (04/24 1038) Resp:  [20] 20 (04/24 1038) BP: (104)/(64) 104/64 (04/24 1038) Weight:  [180 lb (81.6 kg)] 180 lb (81.6 kg) (04/24 1038)  Labs:   Estimated body mass index is 29.95 kg/m as calculated from the following:   Height as of 06/21/17:  5\' 5"  (1.651 m).   Weight as of 06/21/17: 180 lb (81.6 kg).   Imaging Review Plain radiographs demonstrate severe degenerative joint disease of the right knee(s). The overall alignment issignificant valgus. The bone quality appears to be good for age and reported activity level.   Preoperative templating of the joint replacement has been completed, documented, and submitted to the Operating Room personnel in order to optimize intra-operative equipment management.   Anticipated LOS equal to or greater than 2 midnights due to - Age 95 and older with one or more of the following:  - Obesity  - Expected need for hospital services (PT, OT, Nursing) required for safe  discharge  - Active co-morbidities: Respiratory Failure/COPD OR     - Patient is a high risk of re-admission due to: COPD     Assessment/Plan:  End stage arthritis, right knee   The patient history, physical examination, clinical judgment of the provider and imaging studies are consistent with end stage degenerative joint disease of the right knee(s) and total knee arthroplasty is deemed medically necessary. The treatment options including  medical management, injection therapy arthroscopy and arthroplasty were discussed at length. The risks and benefits of total knee arthroplasty were presented and reviewed. The risks due to aseptic loosening, infection, stiffness, patella tracking problems, thromboembolic complications and other imponderables were discussed. The patient acknowledged the explanation, agreed to proceed with the plan and consent was signed. Patient is being admitted for inpatient treatment for surgery, pain control, PT, OT, prophylactic antibiotics, VTE prophylaxis, progressive ambulation and ADL's and discharge planning. The patient is planning to be discharged home with home health services  Mike Craze. [place], [place] [phone]  06/26/2017 9:28 PM

## 2017-06-21 ENCOUNTER — Inpatient Hospital Stay (INDEPENDENT_AMBULATORY_CARE_PROVIDER_SITE_OTHER): Payer: Medicare Other | Admitting: Orthopedic Surgery

## 2017-06-21 ENCOUNTER — Encounter (INDEPENDENT_AMBULATORY_CARE_PROVIDER_SITE_OTHER): Payer: Self-pay | Admitting: Orthopedic Surgery

## 2017-06-21 ENCOUNTER — Ambulatory Visit (INDEPENDENT_AMBULATORY_CARE_PROVIDER_SITE_OTHER): Payer: Medicare Other | Admitting: Orthopedic Surgery

## 2017-06-21 VITALS — BP 104/64 | HR 94 | Temp 98.6°F | Resp 20 | Ht 65.0 in | Wt 180.0 lb

## 2017-06-21 DIAGNOSIS — M1711 Unilateral primary osteoarthritis, right knee: Secondary | ICD-10-CM | POA: Diagnosis not present

## 2017-06-21 NOTE — Progress Notes (Deleted)
   Office Visit Note   Patient: Joanne Kramer           Date of Birth: 21-Feb-1946           MRN: [id number] Visit Date: 06/21/2017              Requested by: Terald Sleeper, PA-C [place], [place] [address] PCP: Terald Sleeper, PA-C   Assessment & Plan: Visit Diagnoses: No diagnosis found.  Plan: ***  Follow-Up Instructions: No follow-ups on file.   Orders:  No orders of the defined types were placed in this encounter.  No orders of the defined types were placed in this encounter.     Procedures: No procedures performed   Clinical Data: No additional findings.   Subjective: Chief Complaint  Patient presents with  . Right Knee - Pain  . Pre-op Exam    06/27/17 Right total knee replacment    HPI  Review of Systems  Constitutional: Positive for fatigue.  HENT: Negative for trouble swallowing.   Eyes: Negative for pain.  Respiratory: Positive for cough and shortness of breath.   Cardiovascular: Negative for leg swelling.  Gastrointestinal: Negative for constipation.  Endocrine: Negative for cold intolerance.  Genitourinary: Negative for difficulty urinating.  Musculoskeletal: Positive for joint swelling.  Skin: Positive for rash.  Allergic/Immunologic: Negative for food allergies.  Neurological: Positive for weakness.  Hematological: Bruises/bleeds easily.  Psychiatric/Behavioral: Positive for sleep disturbance.     Objective: Vital Signs: BP 104/64 (BP Location: Right Arm, Patient Position: Sitting, Cuff Size: Normal)   Pulse 94   Temp 98.6 F (37 C)   Resp 20   Ht 5\' 5"  (1.651 m)   Wt 180 lb (81.6 kg)   BMI 29.95 kg/m   Physical Exam  Ortho Exam  Specialty Comments:  No specialty comments available.  Imaging: No results found.   PMFS History: Patient Active Problem List   Diagnosis Date Noted  . Primary osteoarthritis of right knee 02/15/2017  . Primary osteoarthritis of left knee 02/15/2017  . Disorder of left common  peroneal nerve 02/15/2017  . Nonintractable episodic headache 02/15/2017  . Chronic obstructive pulmonary disease ([place]) 11/08/2016  . Pure hypercholesterolemia 11/08/2016  . Hypoxia 11/08/2016  . Tobacco abuse 11/08/2016   Past Medical History:  Diagnosis Date  . Cataract   . COPD (chronic obstructive pulmonary disease) ([place])   . Emphysema of lung ([place])   . Headache   . OA (osteoarthritis) of knee    right  . Pneumonia   . Requires supplemental oxygen     Family History  Adopted: Yes    Past Surgical History:  Procedure Laterality Date  . BREAST SURGERY     left breast aspiration  . CESAREAN SECTION    . COLONOSCOPY    . EYE SURGERY    . KNEE SURGERY Right   . TONSILLECTOMY     Social History   Occupational History  . Not on file  Tobacco Use  . Smoking status: Former Smoker    Packs/day: 1.00    Years: 55.00    Pack years: 55.00    Types: Cigarettes    Last attempt to quit: 05/07/2017    Years since quitting: 0.1  . Smokeless tobacco: Never Used  Substance and Sexual Activity  . Alcohol use: No  . Drug use: No  . Sexual activity: Not Currently

## 2017-06-21 NOTE — Progress Notes (Signed)
Chief Complaint:right knee pain.  HPI: Joanne Kramer, 72 y.o. female, has a history of pain and functional disability in the right knee due to arthritis and has failed non-surgical conservative treatments for greater than 12 weeks to includeNSAID's and/or analgesics, weight reduction as appropriate and activity modification.  Onset of symptoms was gradual, starting >10 years ago with gradually worsening course since that time. The patient noted prior procedures on the knee to include  arthroscopy and menisectomy on the right knee(s).  Patient currently rates pain in the right knee(s) at 5 out of 10 with activity. Patient has night pain, worsening of pain with activity and weight bearing and crepitus.  Patient has evidence of subchondral cysts, subchondral sclerosis, periarticular osteophytes, joint subluxation and joint space narrowing by imaging studies. There is no active infection.  Patient Active Problem List   Diagnosis Date Noted  . Primary osteoarthritis of right knee 02/15/2017  . Primary osteoarthritis of left knee 02/15/2017  . 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 Nonintractable episodic headache 02/15/2017  . Chronic obstructive pulmonary disease ([place]) 11/08/2016  . Pure hypercholesterolemia 11/08/2016  . Hypoxia 11/08/2016  . Tobacco abuse 11/08/2016   Past Medical History:  Diagnosis Date  . Cataract   . COPD (chronic obstructive pulmonary disease) ([place])   . Emphysema of lung ([place])   . Headache   . OA (osteoarthritis) of knee    right  . Pneumonia   . Requires supplemental oxygen     Past Surgical History:  Procedure Laterality Date  . BREAST SURGERY     left breast aspiration  . CESAREAN SECTION    . COLONOSCOPY    . EYE SURGERY    . KNEE SURGERY Right   . TONSILLECTOMY      No current facility-administered medications for this encounter.    Current Outpatient Medications  Medication Sig Dispense Refill Last Dose  . Calcium Carb-Cholecalciferol  (CALCIUM 600+D3 PO) Take 1 tablet by mouth daily.   Not Taking  . calcium carbonate (TUMS EX) 750 MG chewable tablet Chew 1-2 tablets by mouth 3 (three) times daily as needed (for heartburn/indigestion.).    Not Taking  . Fluticasone-Salmeterol (ADVAIR DISKUS) 250-50 MCG/DOSE AEPB Inhale 1 puff into the lungs 2 (two) times daily.   Taking  . Ipratropium-Albuterol (COMBIVENT RESPIMAT) 20-100 MCG/ACT AERS respimat Inhale 1 puff into the lungs every 4 (four) hours as needed ((typically 4x's daily)).    Taking  . Melatonin 5 MG TABS Take 5 mg by mouth at bedtime as needed (for sleep.).    Not Taking  . Multiple Vitamin (MULTIVITAMIN WITH MINERALS) TABS tablet Take 1 tablet by mouth daily.   Not Taking  . naproxen sodium (ANAPROX) 220 MG tablet Take 440 mg by mouth 2 (two) times daily as needed (for pain.).    Not Taking  . OXYGEN Inhale 2 L into the lungs at bedtime.   Taking  . simvastatin (ZOCOR) 20 MG tablet Take 20 mg by mouth daily with supper.    Taking  . triamcinolone cream (KENALOG) 0.1 % Apply 1 application topically 2 (two) times daily as needed (for skin irritation/contact dermatitis).    Taking  . amoxicillin-clavulanate (AUGMENTIN) 875-125 MG tablet Take 1 tablet by mouth 2 (two) times daily. 20 tablet 0 Taking  . predniSONE (DELTASONE) 20 MG tablet Take 2 tablets (40 mg total) by mouth daily with breakfast. 10 tablet 0 Taking   No Known Allergies  Social History   Tobacco Use  .  Smoking status: Former Smoker    Packs/day: 1.00    Years: 55.00    Pack years: 55.00    Types: Cigarettes    Last attempt to quit: 05/07/2017    Years since quitting: 0.1  . Smokeless tobacco: Never Used  Substance Use Topics  . Alcohol use: No    Family History  Adopted: Yes     ROS  Review of Systems  Constitutional: Positive for fatigue.  HENT: Negative for trouble swallowing.   Eyes: Negative for pain.  Respiratory: Positive for cough and shortness of breath.   Cardiovascular: Negative for  leg swelling.  Gastrointestinal: Negative for constipation.  Endocrine: Negative for cold intolerance.  Genitourinary: Negative for difficulty urinating.  Musculoskeletal: Positive for joint swelling.  Skin: Positive for rash.  Allergic/Immunologic: Negative for food allergies.  Neurological: Positive for weakness.  Hematological: Bruises/bleeds easily.  Psychiatric/Behavioral: Positive for sleep disturbance.   Objective:  Physical Exam  Constitutional: She is oriented to person, place, and time. She appears well-developed and well-nourished.  HENT:  Head: Normocephalic and atraumatic.  Mouth/Throat: Oropharynx is clear and moist.  Eyes: Pupils are equal, round, and reactive to light. Conjunctivae and EOM are normal.  Neck: Neck supple. No thyromegaly present.  Cardiovascular: Normal rate, regular rhythm, normal heart sounds and intact distal pulses.  No murmur heard. Respiratory: Effort normal. She has wheezes.  Decreased breath sounds bilaterally with occasional wheezing  GI: Soft. Bowel sounds are normal. There is no tenderness.  Neurological: She is alert and oriented to person, place, and time.  Skin: Skin is warm and dry.  Psychiatric: She has a normal mood and affect. Her behavior is normal. Judgment and thought content normal.  Musculoskeletal: Right knee reveals approximately 12 degrees of valgus deformity.  Mild to moderate effusion.  Crepitance with range of motion.  Range of motion 5 degrees to 105 degrees.  Vital signs in last 24 hours: Temp:  [98.6 F (37 C)] 98.6 F (37 C) (04/24 1038) Pulse Rate:  [94] 94 (04/24 1038) Resp:  [20] 20 (04/24 1038) BP: (104)/(64) 104/64 (04/24 1038) Weight:  [180 lb (81.6 kg)] 180 lb (81.6 kg) (04/24 1038)  Labs:   Estimated body mass index is 29.95 kg/m as calculated from the following:   Height as of 06/21/17: 5\' 5"  (1.651 m).   Weight as of 06/21/17: 180 lb (81.6 kg).   Imaging Review Plain radiographs demonstrate severe  degenerative joint disease of the right knee(s). The overall alignment issignificant valgus. The bone quality appears to be good for age and reported activity level.   Preoperative templating of the joint replacement has been completed, documented, and submitted to the Operating Room personnel in order to optimize intra-operative equipment management.   Anticipated LOS equal to or greater than 2 midnights due to - Age 5 and older with one or more of the following:  - Obesity  - Expected need for hospital services (PT, OT, Nursing) required for safe  discharge  - Active co-morbidities: Respiratory Failure/COPD OR     - Patient is a high risk of re-admission due to: COPD     Assessment/Plan:  End stage arthritis, right knee   The patient history, physical examination, clinical judgment of the provider and imaging studies are consistent with end stage degenerative joint disease of the right knee(s) and total knee arthroplasty is deemed medically necessary. The treatment options including medical management, injection therapy arthroscopy and arthroplasty were discussed at length. The risks and benefits of total knee  arthroplasty were presented and reviewed. The risks due to aseptic loosening, infection, stiffness, patella tracking problems, thromboembolic complications and other imponderables were discussed. The patient acknowledged the explanation, agreed to proceed with the plan and consent was signed. Patient is being admitted for inpatient treatment for surgery, pain control, PT, OT, prophylactic antibiotics, VTE prophylaxis, progressive ambulation and ADL's and discharge planning. The patient is planning to be discharged home with home health services  Face-to-face time spent with patient was greater than 40 minutes.  Greater than 50% of the time was spent in counseling and coordination of care.

## 2017-06-26 MED ORDER — TRANEXAMIC ACID 1000 MG/10ML IV SOLN
2000.0000 mg | INTRAVENOUS | Status: AC
  Administered 2017-06-27: 2000 mg via TOPICAL
  Filled 2017-06-26: qty 20

## 2017-06-26 MED ORDER — ACETAMINOPHEN 10 MG/ML IV SOLN
1000.0000 mg | INTRAVENOUS | Status: AC
  Administered 2017-06-27: 1000 mg via INTRAVENOUS
  Filled 2017-06-26: qty 100

## 2017-06-26 MED ORDER — CEFAZOLIN SODIUM-DEXTROSE 2-4 GM/100ML-% IV SOLN
2.0000 g | INTRAVENOUS | Status: AC
  Administered 2017-06-27: 2 g via INTRAVENOUS
  Filled 2017-06-26: qty 100

## 2017-06-26 NOTE — Anesthesia Preprocedure Evaluation (Addendum)
Anesthesia Evaluation  Patient identified by MRN, date of birth, ID band Patient awake    Airway Mallampati: II  TM Distance: >3 FB     Dental   Pulmonary pneumonia, COPD, former smoker,    breath sounds clear to auscultation       Cardiovascular negative cardio ROS   Rhythm:Regular Rate:Normal     Neuro/Psych  Headaches,    GI/Hepatic Neg liver ROS,   Endo/Other    Renal/GU negative Renal ROS     Musculoskeletal   Abdominal   Peds  Hematology   Anesthesia Other Findings   Reproductive/Obstetrics                            Anesthesia Physical Anesthesia Plan  ASA: III  Anesthesia Plan: Spinal   Post-op Pain Management:  Regional for Post-op pain   Induction: Intravenous  PONV Risk Score and Plan: 2 and Treatment may vary due to age or medical condition, Ondansetron, Dexamethasone and Midazolam  Airway Management Planned: Nasal Cannula and Simple Face Mask  Additional Equipment:   Intra-op Plan:   Post-operative Plan:   Informed Consent: I have reviewed the patients History and Physical, chart, labs and discussed the procedure including the risks, benefits and alternatives for the proposed anesthesia with the patient or authorized representative who has indicated his/her understanding and acceptance.   Dental advisory given  Plan Discussed with: CRNA and Anesthesiologist  Anesthesia Plan Comments:        Anesthesia Quick Evaluation

## 2017-06-27 ENCOUNTER — Encounter (HOSPITAL_COMMUNITY)
Admission: AD | Disposition: A | Discharge status: Home / Self Care | Payer: Self-pay | Source: Home / Self Care | Attending: Orthopaedic Surgery

## 2017-06-27 ENCOUNTER — Encounter (HOSPITAL_COMMUNITY): Payer: Self-pay | Admitting: General Practice

## 2017-06-27 ENCOUNTER — Inpatient Hospital Stay (HOSPITAL_COMMUNITY)
Admission: AD | Admit: 2017-06-27 | Discharge: 2017-06-30 | DRG: 470 | Disposition: A | Discharge status: Home / Self Care | Payer: Medicare Other | Attending: Orthopaedic Surgery | Admitting: Orthopaedic Surgery

## 2017-06-27 ENCOUNTER — Other Ambulatory Visit: Payer: Self-pay

## 2017-06-27 ENCOUNTER — Ambulatory Visit (HOSPITAL_COMMUNITY): Payer: Medicare Other | Admitting: Anesthesiology

## 2017-06-27 ENCOUNTER — Ambulatory Visit (HOSPITAL_COMMUNITY): Payer: Medicare Other | Admitting: Vascular Surgery

## 2017-06-27 DIAGNOSIS — M25761 Osteophyte, right knee: Secondary | ICD-10-CM | POA: Diagnosis present

## 2017-06-27 DIAGNOSIS — Z7952 Long term (current) use of systemic steroids: Secondary | ICD-10-CM | POA: Diagnosis not present

## 2017-06-27 DIAGNOSIS — M25461 Effusion, right knee: Secondary | ICD-10-CM | POA: Diagnosis not present

## 2017-06-27 DIAGNOSIS — M659 Synovitis and tenosynovitis, unspecified: Secondary | ICD-10-CM | POA: Diagnosis present

## 2017-06-27 DIAGNOSIS — Z7951 Long term (current) use of inhaled steroids: Secondary | ICD-10-CM

## 2017-06-27 DIAGNOSIS — Z9981 Dependence on supplemental oxygen: Secondary | ICD-10-CM | POA: Diagnosis not present

## 2017-06-27 DIAGNOSIS — E7800 Pure hypercholesterolemia, unspecified: Secondary | ICD-10-CM | POA: Diagnosis not present

## 2017-06-27 DIAGNOSIS — Z87891 Personal history of nicotine dependence: Secondary | ICD-10-CM | POA: Diagnosis not present

## 2017-06-27 DIAGNOSIS — J439 Emphysema, unspecified: Secondary | ICD-10-CM | POA: Diagnosis present

## 2017-06-27 DIAGNOSIS — M25561 Pain in right knee: Secondary | ICD-10-CM | POA: Diagnosis present

## 2017-06-27 DIAGNOSIS — Z96651 Presence of right artificial knee joint: Secondary | ICD-10-CM | POA: Diagnosis not present

## 2017-06-27 DIAGNOSIS — D62 Acute posthemorrhagic anemia: Secondary | ICD-10-CM | POA: Diagnosis not present

## 2017-06-27 DIAGNOSIS — M21061 Valgus deformity, not elsewhere classified, right knee: Secondary | ICD-10-CM | POA: Diagnosis present

## 2017-06-27 DIAGNOSIS — M1711 Unilateral primary osteoarthritis, right knee: Secondary | ICD-10-CM | POA: Diagnosis not present

## 2017-06-27 DIAGNOSIS — Z79899 Other long term (current) drug therapy: Secondary | ICD-10-CM

## 2017-06-27 DIAGNOSIS — M170 Bilateral primary osteoarthritis of knee: Secondary | ICD-10-CM | POA: Diagnosis not present

## 2017-06-27 DIAGNOSIS — J449 Chronic obstructive pulmonary disease, unspecified: Secondary | ICD-10-CM | POA: Diagnosis not present

## 2017-06-27 DIAGNOSIS — G8918 Other acute postprocedural pain: Secondary | ICD-10-CM | POA: Diagnosis not present

## 2017-06-27 HISTORY — PX: TOTAL KNEE ARTHROPLASTY: SHX125

## 2017-06-27 SURGERY — ARTHROPLASTY, KNEE, TOTAL
Anesthesia: Spinal | Laterality: Right

## 2017-06-27 MED ORDER — SODIUM CHLORIDE 0.9 % IR SOLN
Status: DC | PRN
  Administered 2017-06-27: 3000 mL

## 2017-06-27 MED ORDER — FENTANYL CITRATE (PF) 100 MCG/2ML IJ SOLN
25.0000 ug | INTRAMUSCULAR | Status: DC | PRN
  Administered 2017-06-27 (×2): 50 ug via INTRAVENOUS

## 2017-06-27 MED ORDER — PREDNISONE 20 MG PO TABS
40.0000 mg | ORAL_TABLET | Freq: Every day | ORAL | Status: DC
  Administered 2017-06-28 – 2017-06-30 (×3): 40 mg via ORAL
  Filled 2017-06-27 (×4): qty 2

## 2017-06-27 MED ORDER — CALCIUM CARBONATE ANTACID 500 MG PO CHEW: 1.0000 | CHEWABLE_TABLET | Freq: Three times a day (TID) | ORAL | Status: DC | PRN

## 2017-06-27 MED ORDER — SIMVASTATIN 20 MG PO TABS
20.0000 mg | ORAL_TABLET | Freq: Every day | ORAL | Status: DC
  Administered 2017-06-28 – 2017-06-29 (×2): 20 mg via ORAL
  Filled 2017-06-27 (×2): qty 1

## 2017-06-27 MED ORDER — METHOCARBAMOL 1000 MG/10ML IJ SOLN
500.0000 mg | Freq: Four times a day (QID) | INTRAMUSCULAR | Status: DC | PRN
Start: 2017-06-27 — End: 2017-06-30
  Filled 2017-06-27: qty 5

## 2017-06-27 MED ORDER — METOCLOPRAMIDE HCL 5 MG PO TABS: 5.0000 mg | ORAL_TABLET | Freq: Three times a day (TID) | ORAL | Status: DC | PRN

## 2017-06-27 MED ORDER — KETOROLAC TROMETHAMINE 15 MG/ML IJ SOLN
7.5000 mg | Freq: Four times a day (QID) | INTRAMUSCULAR | Status: AC
  Administered 2017-06-27 – 2017-06-28 (×4): 7.5 mg via INTRAVENOUS
  Filled 2017-06-27 (×4): qty 1

## 2017-06-27 MED ORDER — SODIUM CHLORIDE 0.9 % IV SOLN: INTRAVENOUS | Status: DC

## 2017-06-27 MED ORDER — BISACODYL 10 MG RE SUPP: 10.0000 mg | Freq: Every day | RECTAL | Status: DC | PRN

## 2017-06-27 MED ORDER — DIPHENHYDRAMINE HCL 12.5 MG/5ML PO ELIX: 12.5000 mg | ORAL_SOLUTION | ORAL | Status: DC | PRN

## 2017-06-27 MED ORDER — FLUTICASONE FUROATE-VILANTEROL 200-25 MCG/INH IN AEPB
1.0000 | INHALATION_SPRAY | Freq: Every day | RESPIRATORY_TRACT | Status: DC
  Administered 2017-06-28 – 2017-06-30 (×3): 1 via RESPIRATORY_TRACT
  Filled 2017-06-27: qty 28

## 2017-06-27 MED ORDER — OXYCODONE HCL 5 MG PO TABS
10.0000 mg | ORAL_TABLET | ORAL | Status: DC | PRN
  Administered 2017-06-28 – 2017-06-30 (×5): 15 mg via ORAL
  Filled 2017-06-27 (×5): qty 3

## 2017-06-27 MED ORDER — ACETAMINOPHEN 10 MG/ML IV SOLN
1000.0000 mg | Freq: Four times a day (QID) | INTRAVENOUS | Status: AC
  Administered 2017-06-27 – 2017-06-28 (×4): 1000 mg via INTRAVENOUS
  Filled 2017-06-27 (×4): qty 100

## 2017-06-27 MED ORDER — ALUM & MAG HYDROXIDE-SIMETH 200-200-20 MG/5ML PO SUSP: 30.0000 mL | ORAL | Status: DC | PRN

## 2017-06-27 MED ORDER — SODIUM CHLORIDE 0.9 % IV SOLN
250.0000 mg | Freq: Every day | INTRAVENOUS | Status: AC
  Administered 2017-06-27: 250 mg via INTRAVENOUS
  Filled 2017-06-27: qty 2

## 2017-06-27 MED ORDER — IPRATROPIUM-ALBUTEROL 0.5-2.5 (3) MG/3ML IN SOLN
3.0000 mL | RESPIRATORY_TRACT | Status: DC | PRN
  Administered 2017-06-28 – 2017-06-30 (×5): 3 mL via RESPIRATORY_TRACT
  Filled 2017-06-27 (×5): qty 3

## 2017-06-27 MED ORDER — DOCUSATE SODIUM 100 MG PO CAPS
100.0000 mg | ORAL_CAPSULE | Freq: Two times a day (BID) | ORAL | Status: DC
  Administered 2017-06-27 – 2017-06-30 (×5): 100 mg via ORAL
  Filled 2017-06-27 (×6): qty 1

## 2017-06-27 MED ORDER — LIDOCAINE 2% (20 MG/ML) 5 ML SYRINGE
INTRAMUSCULAR | Status: AC
  Filled 2017-06-27: qty 5

## 2017-06-27 MED ORDER — PHENYLEPHRINE HCL 10 MG/ML IJ SOLN
INTRAMUSCULAR | Status: DC | PRN
  Administered 2017-06-27: 200 ug via INTRAVENOUS
  Administered 2017-06-27: 160 ug via INTRAVENOUS
  Administered 2017-06-27: 40 ug via INTRAVENOUS

## 2017-06-27 MED ORDER — MAGNESIUM HYDROXIDE 400 MG/5ML PO SUSP: 30.0000 mL | Freq: Every day | ORAL | Status: DC | PRN

## 2017-06-27 MED ORDER — HYDROMORPHONE HCL 2 MG/ML IJ SOLN
0.5000 mg | INTRAMUSCULAR | Status: DC | PRN
  Administered 2017-06-28 (×2): 1 mg via INTRAVENOUS
  Filled 2017-06-27 (×2): qty 1

## 2017-06-27 MED ORDER — PHENOL 1.4 % MT LIQD: 1.0000 | OROMUCOSAL | Status: DC | PRN

## 2017-06-27 MED ORDER — TRIAMCINOLONE ACETONIDE 0.1 % EX CREA
1.0000 "application " | TOPICAL_CREAM | Freq: Two times a day (BID) | CUTANEOUS | Status: DC | PRN
  Filled 2017-06-27: qty 15

## 2017-06-27 MED ORDER — FENTANYL CITRATE (PF) 250 MCG/5ML IJ SOLN
INTRAMUSCULAR | Status: AC
  Filled 2017-06-27: qty 5

## 2017-06-27 MED ORDER — CHLORHEXIDINE GLUCONATE 4 % EX LIQD: 60.0000 mL | Freq: Once | CUTANEOUS | Status: DC

## 2017-06-27 MED ORDER — FENTANYL CITRATE (PF) 100 MCG/2ML IJ SOLN
INTRAMUSCULAR | Status: DC | PRN
  Administered 2017-06-27 (×2): 50 ug via INTRAVENOUS
  Administered 2017-06-27 (×6): 25 ug via INTRAVENOUS

## 2017-06-27 MED ORDER — 0.9 % SODIUM CHLORIDE (POUR BTL) OPTIME
TOPICAL | Status: DC | PRN
  Administered 2017-06-27: 1000 mL

## 2017-06-27 MED ORDER — HYDROMORPHONE HCL 1 MG/ML IJ SOLN: 0.5000 mg | INTRAMUSCULAR | Status: DC | PRN

## 2017-06-27 MED ORDER — ONDANSETRON HCL 4 MG PO TABS: 4.0000 mg | ORAL_TABLET | Freq: Four times a day (QID) | ORAL | Status: DC | PRN

## 2017-06-27 MED ORDER — METHOCARBAMOL 500 MG PO TABS
500.0000 mg | ORAL_TABLET | Freq: Four times a day (QID) | ORAL | Status: DC | PRN
  Administered 2017-06-28 – 2017-06-30 (×3): 500 mg via ORAL
  Filled 2017-06-27 (×3): qty 1

## 2017-06-27 MED ORDER — OXYCODONE HCL 5 MG PO TABS
5.0000 mg | ORAL_TABLET | ORAL | Status: DC | PRN
  Administered 2017-06-28 – 2017-06-30 (×3): 10 mg via ORAL
  Filled 2017-06-27 (×3): qty 2

## 2017-06-27 MED ORDER — BUPIVACAINE-EPINEPHRINE (PF) 0.25% -1:200000 IJ SOLN
INTRAMUSCULAR | Status: AC
  Filled 2017-06-27: qty 30

## 2017-06-27 MED ORDER — BUPIVACAINE IN DEXTROSE 0.75-8.25 % IT SOLN
INTRATHECAL | Status: DC | PRN
  Administered 2017-06-27: 1.6 mL via INTRATHECAL

## 2017-06-27 MED ORDER — ONDANSETRON HCL 4 MG/2ML IJ SOLN: 4.0000 mg | Freq: Four times a day (QID) | INTRAMUSCULAR | Status: DC | PRN

## 2017-06-27 MED ORDER — MENTHOL 3 MG MT LOZG: 1.0000 | LOZENGE | OROMUCOSAL | Status: DC | PRN

## 2017-06-27 MED ORDER — RIVAROXABAN 10 MG PO TABS
10.0000 mg | ORAL_TABLET | Freq: Every day | ORAL | Status: DC
  Administered 2017-06-28 – 2017-06-30 (×3): 10 mg via ORAL
  Filled 2017-06-27 (×3): qty 1

## 2017-06-27 MED ORDER — BUPIVACAINE-EPINEPHRINE 0.25% -1:200000 IJ SOLN
INTRAMUSCULAR | Status: DC | PRN
  Administered 2017-06-27: 30 mL

## 2017-06-27 MED ORDER — PROPOFOL 10 MG/ML IV BOLUS
INTRAVENOUS | Status: AC
  Filled 2017-06-27: qty 20

## 2017-06-27 MED ORDER — METOCLOPRAMIDE HCL 5 MG/ML IJ SOLN: 5.0000 mg | Freq: Three times a day (TID) | INTRAMUSCULAR | Status: DC | PRN

## 2017-06-27 MED ORDER — MIDAZOLAM HCL 2 MG/2ML IJ SOLN
INTRAMUSCULAR | Status: AC
  Filled 2017-06-27: qty 2

## 2017-06-27 MED ORDER — LACTATED RINGERS IV SOLN
INTRAVENOUS | Status: DC | PRN
  Administered 2017-06-27 (×3): via INTRAVENOUS

## 2017-06-27 MED ORDER — PROPOFOL 500 MG/50ML IV EMUL
INTRAVENOUS | Status: DC | PRN
  Administered 2017-06-27: 30 ug/kg/min via INTRAVENOUS

## 2017-06-27 MED ORDER — HYDROMORPHONE HCL 2 MG/ML IJ SOLN: 0.2500 mg | INTRAMUSCULAR | Status: DC | PRN

## 2017-06-27 MED ORDER — MAGNESIUM CITRATE PO SOLN: 1.0000 | Freq: Once | ORAL | Status: DC | PRN

## 2017-06-27 MED ORDER — FENTANYL CITRATE (PF) 100 MCG/2ML IJ SOLN
INTRAMUSCULAR | Status: AC
  Administered 2017-06-27: 50 ug via INTRAVENOUS
  Filled 2017-06-27: qty 2

## 2017-06-27 MED ORDER — SODIUM CHLORIDE 0.9 % IV SOLN
75.0000 mL/h | INTRAVENOUS | Status: DC
  Administered 2017-06-27 – 2017-06-28 (×2): 75 mL/h via INTRAVENOUS

## 2017-06-27 MED ORDER — MIDAZOLAM HCL 5 MG/5ML IJ SOLN
INTRAMUSCULAR | Status: DC | PRN
  Administered 2017-06-27 (×2): 1 mg via INTRAVENOUS

## 2017-06-27 MED ORDER — CEFAZOLIN SODIUM-DEXTROSE 2-4 GM/100ML-% IV SOLN
2.0000 g | Freq: Four times a day (QID) | INTRAVENOUS | Status: AC
  Administered 2017-06-27 (×2): 2 g via INTRAVENOUS
  Filled 2017-06-27 (×2): qty 100

## 2017-06-27 MED ORDER — IPRATROPIUM-ALBUTEROL 20-100 MCG/ACT IN AERS
1.0000 | INHALATION_SPRAY | RESPIRATORY_TRACT | Status: DC | PRN
  Filled 2017-06-27: qty 4

## 2017-06-27 SURGICAL SUPPLY — 63 items
BAG DECANTER FOR FLEXI CONT (MISCELLANEOUS) ×3 IMPLANT
BANDAGE ESMARK 6X9 LF (GAUZE/BANDAGES/DRESSINGS) ×1 IMPLANT
BLADE SAGITTAL 25.0X1.19X90 (BLADE) ×2 IMPLANT
BLADE SAGITTAL 25.0X1.19X90MM (BLADE) ×1
BNDG ESMARK 6X9 LF (GAUZE/BANDAGES/DRESSINGS) ×3
BOWL SMART MIX CTS (DISPOSABLE) ×3 IMPLANT
CAP KNEE TOTAL 3 SIGMA ×3 IMPLANT
CATH FOLEY 2WAY 5CC 16FR (CATHETERS) ×2
CATH URTH STD 16FR FL 2W DRN (CATHETERS) ×1 IMPLANT
CEMENT HV SMART SET (Cement) ×6 IMPLANT
COVER SURGICAL LIGHT HANDLE (MISCELLANEOUS) ×3 IMPLANT
CUFF TOURNIQUET SINGLE 34IN LL (TOURNIQUET CUFF) ×3 IMPLANT
CUFF TOURNIQUET SINGLE 44IN (TOURNIQUET CUFF) IMPLANT
DECANTER SPIKE VIAL GLASS SM (MISCELLANEOUS) ×3 IMPLANT
DRAPE EXTREMITY T 121X128X90 (DRAPE) ×3 IMPLANT
DRAPE HALF SHEET 40X57 (DRAPES) ×6 IMPLANT
DRSG ADAPTIC 3X8 NADH LF (GAUZE/BANDAGES/DRESSINGS) ×3 IMPLANT
DRSG PAD ABDOMINAL 8X10 ST (GAUZE/BANDAGES/DRESSINGS) ×6 IMPLANT
DURAPREP 26ML APPLICATOR (WOUND CARE) ×15 IMPLANT
ELECT CAUTERY BLADE 6.4 (BLADE) ×3 IMPLANT
ELECT REM PT RETURN 9FT ADLT (ELECTROSURGICAL) ×3
ELECTRODE REM PT RTRN 9FT ADLT (ELECTROSURGICAL) ×1 IMPLANT
EVACUATOR 1/8 PVC DRAIN (DRAIN) IMPLANT
FACESHIELD WRAPAROUND (MASK) ×6 IMPLANT
GAUZE SPONGE 4X4 12PLY STRL (GAUZE/BANDAGES/DRESSINGS) ×3 IMPLANT
GLOVE BIOGEL PI IND STRL 8 (GLOVE) ×1 IMPLANT
GLOVE BIOGEL PI IND STRL 8.5 (GLOVE) ×1 IMPLANT
GLOVE BIOGEL PI INDICATOR 8 (GLOVE) ×2
GLOVE BIOGEL PI INDICATOR 8.5 (GLOVE) ×2
GLOVE ECLIPSE 6.5 STRL STRAW (GLOVE) ×6 IMPLANT
GLOVE ECLIPSE 8.0 STRL XLNG CF (GLOVE) ×9 IMPLANT
GLOVE ECLIPSE 8.5 STRL (GLOVE) ×9 IMPLANT
GOWN STRL REUS W/ TWL LRG LVL3 (GOWN DISPOSABLE) ×2 IMPLANT
GOWN STRL REUS W/TWL 2XL LVL3 (GOWN DISPOSABLE) ×3 IMPLANT
GOWN STRL REUS W/TWL LRG LVL3 (GOWN DISPOSABLE) ×4
HANDPIECE INTERPULSE COAX TIP (DISPOSABLE) ×2
KIT BASIN OR (CUSTOM PROCEDURE TRAY) ×3 IMPLANT
KIT TURNOVER KIT B (KITS) ×3 IMPLANT
MANIFOLD NEPTUNE II (INSTRUMENTS) ×3 IMPLANT
NEEDLE 22X1 1/2 (OR ONLY) (NEEDLE) ×6 IMPLANT
NS IRRIG 1000ML POUR BTL (IV SOLUTION) ×3 IMPLANT
PACK TOTAL JOINT (CUSTOM PROCEDURE TRAY) ×3 IMPLANT
PAD ABD 8X10 STRL (GAUZE/BANDAGES/DRESSINGS) ×6 IMPLANT
PAD ARMBOARD 7.5X6 YLW CONV (MISCELLANEOUS) ×6 IMPLANT
PAD CAST 4YDX4 CTTN HI CHSV (CAST SUPPLIES) ×1 IMPLANT
PADDING CAST COTTON 4X4 STRL (CAST SUPPLIES) ×2
PADDING CAST COTTON 6X4 STRL (CAST SUPPLIES) ×3 IMPLANT
SET HNDPC FAN SPRY TIP SCT (DISPOSABLE) ×1 IMPLANT
STAPLER VISISTAT 35W (STAPLE) ×3 IMPLANT
SUCTION FRAZIER HANDLE 10FR (MISCELLANEOUS) ×2
SUCTION TUBE FRAZIER 10FR DISP (MISCELLANEOUS) ×1 IMPLANT
SURGIFLO W/THROMBIN 8M KIT (HEMOSTASIS) IMPLANT
SUT BONE WAX W31G (SUTURE) ×3 IMPLANT
SUT ETHIBOND NAB CT1 #1 30IN (SUTURE) ×6 IMPLANT
SUT MNCRL AB 3-0 PS2 18 (SUTURE) ×3 IMPLANT
SUT VIC AB 0 CT1 27 (SUTURE) ×2
SUT VIC AB 0 CT1 27XBRD ANBCTR (SUTURE) ×1 IMPLANT
SYR CONTROL 10ML LL (SYRINGE) IMPLANT
TOWEL OR 17X24 6PK STRL BLUE (TOWEL DISPOSABLE) ×3 IMPLANT
TOWEL OR 17X26 10 PK STRL BLUE (TOWEL DISPOSABLE) ×3 IMPLANT
TRAY FOLEY BAG SILVER LF 16FR (SET/KITS/TRAYS/PACK) ×3 IMPLANT
WRAP KNEE MAXI GEL POST OP (GAUZE/BANDAGES/DRESSINGS) ×3 IMPLANT
YANKAUER SUCT BULB TIP NO VENT (SUCTIONS) ×3 IMPLANT

## 2017-06-27 NOTE — Progress Notes (Signed)
RN received pt and she was hard to arouse O2 was 84 on 4L of [place], came around to stated name and DOB and where she was and what was happening but is out of it can not state pain level just moaning and groaning. RN called rapid Nurse to come look at pt. Placed her on Non rebreather mask 10L now sating 100%

## 2017-06-27 NOTE — Progress Notes (Signed)
Dr. Koleen Nimrod anesthesiologist came to unit and saw patient, stated to not give pt any narcotics, and to call ortho MD and get and NSAID order. RN completed verbal orders given.

## 2017-06-27 NOTE — Progress Notes (Signed)
RN called regarding pts stas 84-86% s/p Right total knee arthroplasty on 3L [place] and hard to arouse. I requested they place pt on a NRB and I was on my to see pt. Dr. Durward Fortes had also made aware. Sats 100% on NRB, pt alert and oriented x3, pt sleeping but easy to arouse, follow commands, answers all questions appropriately with minimal delay in response. Pt did question why we were all "staring at her."  Weaned pt back to 3L [place], sats maintained 98-100% pt wears 3L [place] at night. Spouse reports she is always SOB with any activity.  Advised RN Prentiss Bells to call for any concerns.

## 2017-06-27 NOTE — Progress Notes (Signed)
PATIENT ID:      Joanne Kramer  MRN:     [id number] DOB/AGE:    1945/11/01 / 72 y.o.       OPERATIVE REPORT    DATE OF PROCEDURE:  06/27/2017       PREOPERATIVE DIAGNOSIS:END STAGE   OSTEOARTHRITIS RIGHT KNEE                                                       Estimated body mass index is 29.95 kg/m as calculated from the following:   Height as of this encounter: 5\' 5"  (1.651 m).   Weight as of this encounter: 180 lb (81.6 kg).     POSTOPERATIVE DIAGNOSIS: END STAGE  OSTEOARTHRITIS RIGHT KNEE                                                                     Estimated body mass index is 29.95 kg/m as calculated from the following:   Height as of this encounter: 5\' 5"  (1.651 m).   Weight as of this encounter: 180 lb (81.6 kg).     PROCEDURE:  Procedure(s): RIGHT TOTAL KNEE ARTHROPLASTY      SURGEON:  Joni Fears, MD    ASSISTANT:   Biagio Borg, PA-C   (Present and scrubbed throughout the case, critical for assistance with exposure, retraction, instrumentation, and closure.)          ANESTHESIA: regional, spinal and IV sedation     DRAINS: none :      TOURNIQUET TIME:  Total Tourniquet Time Documented: Thigh (Right) - 66 minutes Total: Thigh (Right) - 66 minutes     COMPLICATIONS:  None   CONDITION:  stable  PROCEDURE IN DETAIL: [place] 06/27/2017, 9:21 AM  Patient ID: Joanne Kramer, female   DOB: 03-Jul-1945, 72 y.o.   MRN: [id number]

## 2017-06-27 NOTE — Anesthesia Procedure Notes (Signed)
Procedure Name: MAC Date/Time: 06/27/2017 7:32 AM Performed by: Lance Coon, CRNA Pre-anesthesia Checklist: Patient identified, Emergency Drugs available, Suction available, Patient being monitored and Timeout performed Patient Re-evaluated:Patient Re-evaluated prior to induction Oxygen Delivery Method: Simple face mask

## 2017-06-27 NOTE — Progress Notes (Signed)
Rn called Dr. Carlota Raspberry Anesthesiology and informed her what was going on with the patient and she stated that she was on [place] and would come up and see that patient as soon as she could, but to call Dr. Candida Peeling when RN called Dr. Sabra Heck and explained to him the situation and that Dr. Carlota Raspberry asked me to ask him to come see the patient. He stated that "he couldn't he didn't have time and had other patients." RN said okay thanks and hung up. Awaiting Dr. Carlota Raspberry to come see patient.

## 2017-06-27 NOTE — Progress Notes (Signed)
The recent History & Physical has been reviewed. I have personally examined the patient today. There is no interval change to the documented History & Physical. The patient would like to proceed with the procedure.  Garald Balding 06/27/2017,  7:10 AM  Patient ID: Joanne Kramer, female   DOB: April 26, 1945, 72 y.o.   MRN: [id number]

## 2017-06-27 NOTE — Anesthesia Postprocedure Evaluation (Signed)
Anesthesia Post Note  Patient: Joanne Kramer  Procedure(s) Performed: RIGHT TOTAL KNEE ARTHROPLASTY (Right )     Patient location during evaluation: PACU Anesthesia Type: Spinal Level of consciousness: awake Pain management: pain level controlled Respiratory status: spontaneous breathing Cardiovascular status: stable Anesthetic complications: no    Last Vitals:  Vitals:   06/27/17 1136 06/27/17 1140  BP: 125/72   Pulse: 60   Resp: 14   Temp:    SpO2: 100% 100%    Last Pain:  Vitals:   06/27/17 1121  TempSrc: Oral  PainSc:                  [name]

## 2017-06-27 NOTE — Anesthesia Procedure Notes (Signed)
Anesthesia Regional Block: Adductor canal block   Pre-Anesthetic Checklist: ,, timeout performed, Correct Patient, Correct Site, Correct Laterality, Correct Procedure, Correct Position, site marked, Risks and benefits discussed,  Surgical consent,  Pre-op evaluation,  At surgeon's request and post-op pain management  Laterality: Right  Prep: chloraprep       Needles:   Needle Type: Stimulator Needle - 40          Additional Needles:   Procedures: Doppler guided,,,, ultrasound used (permanent image in chart),,,,  Narrative:  Start time: 06/27/2017 6:50 AM End time: 06/27/2017 7:05 AM Injection made incrementally with aspirations every 5 mL.  Performed by: Personally  Anesthesiologist: Belinda Block, MD

## 2017-06-27 NOTE — Progress Notes (Signed)
PT Cancellation Note  Patient Details Name: Joanne Kramer MRN: [id number] DOB: Oct 29, 1945   Cancelled Treatment:    Reason Eval/Treat Not Completed: Medical issues which prohibited therapy. PT eval received, chart reviewed. Given the events of the day, will hold PT eval at this time. Will follow up when pt is medically ready to participate.    Thelma Comp 06/27/2017, 1:04 PM   Rolinda Roan, PT, DPT Acute Rehabilitation Services Pager: [phone]

## 2017-06-27 NOTE — Progress Notes (Signed)
RN called and spoke with Aaron Edelman, [place] and notified him about what was going on. PA stated that I should inform Anesthesiology it may be the spinal and to keep him updated. RN calling Anaesthesia waiting for further instruction

## 2017-06-27 NOTE — Anesthesia Procedure Notes (Signed)
Spinal  Patient location during procedure: OR Start time: 06/27/2017 7:17 AM End time: 06/27/2017 7:21 AM Staffing Anesthesiologist: Lyn Hollingshead, MD Performed: anesthesiologist  Preanesthetic Checklist Completed: patient identified, site marked, surgical consent, pre-op evaluation, timeout performed, IV checked, risks and benefits discussed and monitors and equipment checked Spinal Block Patient position: sitting Prep: site prepped and draped and DuraPrep Patient monitoring: continuous pulse ox and blood pressure Location: L3-4 Injection technique: single-shot Needle Needle type: Pencan  Needle gauge: 24 G Assessment Sensory level: T8

## 2017-06-27 NOTE — Transfer of Care (Signed)
Immediate Anesthesia Transfer of Care Note  Patient: Joanne Kramer  Procedure(s) Performed: RIGHT TOTAL KNEE ARTHROPLASTY (Right )  Patient Location: PACU  Anesthesia Type:Spinal and MAC combined with regional for post-op pain  Level of Consciousness: awake and patient cooperative  Airway & Oxygen Therapy: Patient Spontanous Breathing and Patient connected to face mask oxygen  Post-op Assessment: Report given to RN and Post -op Vital signs reviewed and stable  Post vital signs: Reviewed and stable  Last Vitals:  Vitals Value Taken Time  BP 147/75 06/27/2017  9:46 AM  Temp    Pulse 78 06/27/2017  9:46 AM  Resp 18 06/27/2017  9:46 AM  SpO2 96 % 06/27/2017  9:46 AM  Vitals shown include unvalidated device data.  Last Pain:  Vitals:   06/27/17 0617  TempSrc: Oral  PainSc: 2       Patients Stated Pain Goal: 2 (62/94/76 [id number])  Complications: No apparent anesthesia complications

## 2017-06-27 NOTE — Op Note (Signed)
NAME:  Joanne Kramer, Joanne Kramer                  ACCOUNT NO.:  MEDICAL RECORD NO.:  [id number]  LOCATION:                                 FACILITY:  PHYSICIAN:  Vonna Kotyk. Durward Fortes, M.D.    DATE OF BIRTH:  DATE OF PROCEDURE:  06/27/2017 DATE OF DISCHARGE:                              OPERATIVE REPORT   PREOPERATIVE DIAGNOSIS:  End-stage osteoarthritis, right knee.  POSTOPERATIVE DIAGNOSIS:  End-stage osteoarthritis, right knee.  PROCEDURES:  Right total knee replacement.  SURGEON:  Vonna Kotyk. Durward Fortes, M.D.  ASSISTANT:  Aaron Edelman D. Petrarca, P.A.-C.  ANESTHESIA:  Spinal with adductor canal block and IV sedation.  COMPLICATIONS:  None.  COMPONENTS:  DePuy LCS standard plus femoral component, a #4 rotating keeled tibial tray with a 10-mm polyethylene bridging bearing, a metal back 3 peg rotating patella.  Components were secured with polymethyl methacrylate.  DESCRIPTION OF PROCEDURE:  Ms. Metheney was met in the holding area, identified the right knee as the appropriate operative site and marked it accordingly.  Anesthesia had performed an adductor canal block.  The patient was then transported to room #7.  Anesthesia performed a spinal anesthetic without difficulty.  The patient was carefully placed supine on the operating room table.  Nursing staff inserted a Foley catheter. Urine was clear.  Right lower extremity was then placed in a thigh tourniquet.  The right lower extremity was prepped with chlorhexidine scrub and then DuraPrep x2.  Sterile draping was performed.  Time-out was called.  The right lower extremity was then elevated and Esmarch exsanguinated with a proximal tourniquet at 350 mmHg.  A midline longitudinal incision was made centered about the patella extending from the superior pouch to the tibial tubercle.  Via sharp dissection, incision was carried down to subcutaneous tissue.  Gross bleeders were Bovie coagulated.  First layer of capsule was incised in the  midline.  A medial parapatellar incision was then made with the Bovie.  The joint was entered.  There was a small clear yellow joint effusion.  The patella was everted 180 degrees laterally and the knee flexed to 90 degrees.  There was a moderate amount of beefy red synovitis.  A synovectomy was performed.  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  These were removed.  I measured a standard plus femoral component.  There was a significant valgus deformity with weightbearing. I was able to correct this to neutral.  First bony cut was then made transversely in the proximal tibia with a 7- degree angle of declination.  After each bony cut on the tibia and the femur, I checked my alignment with the external guide.  Subsequent cuts were then made on the femur using the standard plus femoral guide.  I used a 4-degree distal femoral valgus cut.  Flexion and extension gaps were perfectly symmetrical at 10 mm.  MCL and LCL remained intact throughout the procedure.  Laminar spreaders were then inserted along the medial lateral compartments with the knee flexed to 90 degrees.  I removed medial and lateral  menisci.  The patient had a prior partial medial meniscectomy. Remaining rim was removed.  Removed ACL and PCL and osteophytes in the lateral and medial femoral condyle.  There was a large fabella laterally.  This was skeletonized.  There did not appear to be any impingement at that point.  Retractors were then placed around the tibia, was advanced anteriorly and measured a #4 tibial tray.  This was pinned in place.  Center hole was then made followed by the keeled cut.  With the tibial jig in place, I applied a 10 mm polyethylene bridging bearing, followed by the standard plus femoral component.  The entire construct was reduced and through a full  range of motion, it had full extension and no opening with a varus or valgus stress.  The patella was then prepared by removing 10 mm of bone leaving 13 mm of patellar thickness.  The patella jig was inserted, 3 holes made.  The trial patella inserted and then reduced.  Through a full range of motion, they did not dislocate.  The trial components were removed.  The joint was then copiously irrigated with saline solution.  Final components were then impacted with polymethyl methacrylate. Initially applied the #4 tibial tray followed by the 10-mm polyethylene bridging bearing and the standard plus femoral component.  The components were reduced and any extraneous methacrylate was removed from the periphery of the components.  The patella was then impacted with methacrylate and any extraneous methacrylate was removed from its periphery.  At approximately 16 minutes, the methacrylate had matured during which time, we injected the joint with 0.25% Marcaine with epinephrine.  The tourniquet was deflated at 65 minutes with a nice capillary refill to the joint surface.  Gross bleeders were Bovie coagulated.  We applied tranexamic acid topically under compression.  With a nice dry field, the wound was then closed.  I closed the deep capsule with a running #1 Ethibond, superficial capsule with a running 0 Vicryl, subcu with 3-0 Monocryl.  Skin was closed with skin clips.  Sterile bulky dressing was applied followed by the patient's support stocking.  The patient tolerated the procedure without complication, returned to the postanesthesia recovery room in satisfactory condition.     Vonna Kotyk. Durward Fortes, M.D.     PWW/MEDQ  D:  06/27/2017  T:  06/27/2017  Job:  [id number]

## 2017-06-27 NOTE — Progress Notes (Signed)
RN Re-contacted Brain, PA and updated him on what was going on, he stated that he would get a hospitalist on board and we would go from there and to keep him updated. RN then called Dr. Nyoka Cowden back to inform her about what Dr. Sabra Heck said and she said "she would come up"

## 2017-06-28 ENCOUNTER — Encounter (HOSPITAL_COMMUNITY): Payer: Self-pay | Admitting: Orthopaedic Surgery

## 2017-06-28 LAB — CBC
HCT: 35.3 % — ABNORMAL LOW (ref 36.0–46.0)
HEMOGLOBIN: 10.6 g/dL — AB (ref 12.0–15.0)
MCH: 29.4 pg (ref 26.0–34.0)
MCHC: 30 g/dL (ref 30.0–36.0)
MCV: 98.1 fL (ref 78.0–100.0)
PLATELETS: 197 10*3/uL (ref 150–400)
RBC: 3.6 MIL/uL — ABNORMAL LOW (ref 3.87–5.11)
RDW: 14.5 % (ref 11.5–15.5)
WBC: 12.2 10*3/uL — ABNORMAL HIGH (ref 4.0–10.5)

## 2017-06-28 LAB — BASIC METABOLIC PANEL
Anion gap: 4 — ABNORMAL LOW (ref 5–15)
BUN: 9 mg/dL (ref 6–20)
CALCIUM: 8.2 mg/dL — AB (ref 8.9–10.3)
CHLORIDE: 103 mmol/L (ref 101–111)
CO2: 30 mmol/L (ref 22–32)
CREATININE: 0.57 mg/dL (ref 0.44–1.00)
Glucose, Bld: 141 mg/dL — ABNORMAL HIGH (ref 65–99)
Potassium: 4.3 mmol/L (ref 3.5–5.1)
SODIUM: 137 mmol/L (ref 135–145)

## 2017-06-28 MED ORDER — RIVAROXABAN 10 MG PO TABS: 10.0000 mg | ORAL_TABLET | Freq: Every day | ORAL | 0 refills | Status: DC

## 2017-06-28 MED ORDER — OXYCODONE HCL 5 MG PO TABS: 5.0000 mg | ORAL_TABLET | ORAL | 0 refills | Status: DC | PRN

## 2017-06-28 MED ORDER — METHOCARBAMOL 500 MG PO TABS: 500.0000 mg | ORAL_TABLET | Freq: Three times a day (TID) | ORAL | 0 refills | Status: DC | PRN

## 2017-06-28 NOTE — Evaluation (Deleted)
Physical Therapy Evaluation Patient Details Name: Joanne Kramer MRN: [id number] DOB: 08-Sep-1945 Today's Date: 06/28/2017   History of Present Illness  Pt is a 72 y/o female who presents s/p R TKA on 06/27/17. Pt is currently 50% PWB on the operative side. PMH significant for emphysema, COPD, cataracts.  Clinical Impression  Pt admitted with above diagnosis. Pt currently with functional limitations due to the deficits listed below (see PT Problem List). At the time of PT eval pt was able to perform transfers and ambulation with gross min guard assist for balance support and safety. Pt anticipates d/c home tomorrow, however will need to demonstrate improved ambulation distance and safe stair negotiation . Anticipate she will progress well with post-op mobility. Acutely, pt will benefit from skilled PT to increase their independence and safety with mobility to allow discharge to the venue listed below.       Follow Up Recommendations Follow surgeon's recommendation for DC plan and follow-up therapies    Equipment Recommendations  Rolling walker with 5" wheels    Recommendations for Other Services       Precautions / Restrictions Precautions Precautions: Fall;Knee Precaution Comments: Pt was educated on NO pillow, roll, ice pack, etc under knee, and to rest with knee in extension.  Restrictions Weight Bearing Restrictions: Yes RLE Weight Bearing: Partial weight bearing RLE Partial Weight Bearing Percentage or Pounds: 50%      Mobility  Bed Mobility Overal bed mobility: Needs Assistance Bed Mobility: Supine to Sit     Supine to sit: Min assist     General bed mobility comments: Pt able to advance LE's out towards EOB without assist. Therapist provided support on operative leg as she scooted out fully to EOB and placed foot on floor.   Transfers Overall transfer level: Needs assistance Equipment used: Rolling walker (2 wheeled) Transfers: Sit to/from Stand Sit to Stand: Min  guard         General transfer comment: Hands-on guarding for safety as pt powered up to full standing position.   Ambulation/Gait Ambulation/Gait assistance: Min guard Ambulation Distance (Feet): 50 Feet Assistive device: Rolling walker (2 wheeled) Gait Pattern/deviations: Step-through pattern;Decreased stride length;Trunk flexed Gait velocity: Decreased Gait velocity interpretation: <1.8 ft/sec, indicate of risk for recurrent falls General Gait Details: VC's for improved posture, sequencing with the RW, and maintenance of 50% PWB status on the RLE. Pt very painful throughout gait training and required hands-on guarding for safety due to unsteadiness.   Stairs            Wheelchair Mobility    Modified Rankin (Stroke Patients Only)       Balance Overall balance assessment: Needs assistance Sitting-balance support: Feet supported;No upper extremity supported Sitting balance-Leahy Scale: Fair     Standing balance support: During functional activity;Bilateral upper extremity supported Standing balance-Leahy Scale: Poor Standing balance comment: Reliant on UE support during functional mobility                             Pertinent Vitals/Pain Pain Assessment: Faces Faces Pain Scale: Hurts whole lot Pain Location: R knee Pain Descriptors / Indicators: Operative site guarding;Aching;Crying Pain Intervention(s): Limited activity within patient's tolerance;Monitored during session;Repositioned    Home Living Family/patient expects to be discharged to:: Private residence Living Arrangements: Spouse/significant other Available Help at Discharge: Family;Available 24 hours/day Type of Home: House Home Access: Stairs to enter Entrance Stairs-Rails: Can reach both Entrance Stairs-Number of Steps: 2-3 Home Layout: One  level Home Equipment: Grab bars - toilet;Grab bars - tub/shower;Shower seat;Cane - single point      Prior Function Level of Independence:  Independent               Hand Dominance   Dominant Hand: Right    Extremity/Trunk Assessment                Communication   Communication: No difficulties  Cognition Arousal/Alertness: Awake/alert Behavior During Therapy: WFL for tasks assessed/performed Overall Cognitive Status: Within Functional Limits for tasks assessed                                        General Comments      Exercises Total Joint Exercises Ankle Circles/Pumps: 20 reps Quad Sets: 10 reps Short Arc Quad: 10 reps Heel Slides: 10 reps Goniometric ROM: 14-78 AROM in sitting   Assessment/Plan    PT Assessment    PT Problem List         PT Treatment Interventions      PT Goals (Current goals can be found in the Care Plan section)  Acute Rehab PT Goals Patient Stated Goal: Home tomorrow    Frequency 7X/week   Barriers to discharge        Co-evaluation               AM-PAC PT "6 Clicks" Daily Activity  Outcome Measure Difficulty turning over in bed (including adjusting bedclothes, sheets and blankets)?: A Little Difficulty moving from lying on back to sitting on the side of the bed? : A Little Difficulty sitting down on and standing up from a chair with arms (e.g., wheelchair, bedside commode, etc,.)?: A Little Help needed moving to and from a bed to chair (including a wheelchair)?: A Little Help needed walking in hospital room?: A Little Help needed climbing 3-5 steps with a railing? : A Lot 6 Click Score: 17    End of Session Equipment Utilized During Treatment: Gait belt Activity Tolerance: Patient tolerated treatment well Patient left: in chair;with call bell/phone within reach;with family/visitor present Nurse Communication: Mobility status PT Visit Diagnosis: Pain;Unsteadiness on feet (R26.81);Other abnormalities of gait and mobility (R26.89) Pain - Right/Left: Right Pain - part of body: Knee    Time:  -      Charges:         PT G  Codes:        Rolinda Roan, PT, DPT Acute Rehabilitation Services Pager: [place] 06/28/2017, 11:57 AM

## 2017-06-28 NOTE — Discharge Summary (Signed)
Joanne Fears, MD   Biagio Borg, PA-C [address] [place], [place]  [address]                             [phone]  PATIENT ID: Joanne Kramer        MRN:  [id number]          DOB/AGE: 1945/05/13 / 72 y.o.    DISCHARGE SUMMARY  ADMISSION DATE:    06/27/2017 DISCHARGE DATE:   06/28/2017   ADMISSION DIAGNOSIS: OSTEOARTHRITIS RIGHT KNEE    DISCHARGE DIAGNOSIS:  OSTEOARTHRITIS RIGHT KNEE    ADDITIONAL DIAGNOSIS: Active Problems:   Primary osteoarthritis of right knee   Total knee replacement status, right  Past Medical History:  Diagnosis Date  . Cataract   . COPD (chronic obstructive pulmonary disease) ([place])   . Emphysema of lung ([place])   . Headache   . OA (osteoarthritis) of knee    right  . Pneumonia   . Requires supplemental oxygen     PROCEDURE: Procedure(s): RIGHT TOTAL KNEE ARTHROPLASTY Right on 06/27/2017  CONSULTS: none    HISTORY: See H&P in chart  HOSPITAL COURSE:  Joanne Kramer is a 72 y.o. admitted on 06/27/2017 and found to have a diagnosis of [place].  After appropriate laboratory studies were obtained  they were taken to the operating room on 06/27/2017 and underwent  Procedure(s): RIGHT TOTAL KNEE ARTHROPLASTY  Right.   They were given perioperative antibiotics:  Anti-infectives (From admission, onward)   Start     Dose/Rate Route Frequency Ordered Stop   06/27/17 1400  ceFAZolin (ANCEF) IVPB 2g/100 mL premix     2 g 200 mL/hr over 30 Minutes Intravenous Every 6 hours 06/27/17 1052 06/27/17 2122   06/27/17 0600  ceFAZolin (ANCEF) IVPB 2g/100 mL premix     2 g 200 mL/hr over 30 Minutes Intravenous To ShortStay Surgical 06/26/17 1156 06/27/17 0739    .  Tolerated the procedure well.  Placed with a foley intraoperatively.     Toradol was given post op.  POD #1, allowed out of bed to a chair.  PT for ambulation and exercise program.  Foley D/C'd in morning.  IV saline locked.  O2 discontionued.  POD #2, continued PT and  ambulation.  The remainder of the hospital course was dedicated to ambulation and strengthening.   The patient was discharged on 1 Day Post-Op in  Stable condition.  Blood products given:none  DIAGNOSTIC STUDIES: Recent vital signs:  Patient Vitals for the past 24 hrs:  BP Temp Temp src Pulse Resp SpO2  06/28/17 0511 (!) 103/58 98.6 F (37 C) Oral 76 - 94 %  06/28/17 0040 (!) 101/58 98.6 F (37 C) Oral 85 - 92 %  06/27/17 1947 (!) 111/52 98.2 F (36.8 C) Oral 79 - 94 %  06/27/17 1542 (!) 119/53 - - 80 - 94 %  06/27/17 1140 - - - - - 100 %  06/27/17 1136 125/72 - - 60 14 100 %  06/27/17 1131 120/76 - - - - -  06/27/17 1127 (!) 137/104 97.7 F (36.5 C) - (!) 54 - -  06/27/17 1125 (!) 122/109 - - 60 - 100 %  06/27/17 1121 (!) 118/106 97.7 F (36.5 C) Oral 68 - 94 %  06/27/17 1107 (!) 134/117 (!) 97.3 F (36.3 C) Oral 72 - 92 %  06/27/17 1046 (!) 142/83 - - 63 (!) 23 98 %  06/27/17  1045 - 97.9 F (36.6 C) - 61 (!) 21 97 %  06/27/17 1031 (!) 165/79 - - 72 18 96 %  06/27/17 1030 - - - 72 19 97 %  06/27/17 1016 (!) 151/87 - - 65 12 100 %  06/27/17 1015 - - - 62 12 99 %  06/27/17 1006 (!) 142/75 - - 64 17 93 %  06/27/17 1001 (!) 142/112 - - - 19 -  06/27/17 1000 - - - 82 (!) 21 100 %  06/27/17 0946 (!) 147/75 - - 78 18 96 %  06/27/17 0945 - (!) 96.8 F (36 C) - 87 - 95 %       Recent laboratory studies: Recent Labs    06/28/17 0441  WBC 12.2*  HGB 10.6*  HCT 35.3*  PLT 197   Recent Labs    06/28/17 0441  NA 137  K 4.3  CL 103  CO2 30  BUN 9  CREATININE 0.57  GLUCOSE 141*  CALCIUM 8.2*   Lab Results  Component Value Date   INR 1.08 06/16/2017     Recent Radiographic Studies :  Nm Myocar Multi W/spect W/wall Motion / Ef  Result Date: 06/16/2017  There was no ST segment deviation noted during stress.  The study is normal. There are no perfusion defects.  This is a low risk study.  The left ventricular ejection fraction is normal (55-65%).      DISCHARGE INSTRUCTIONS: Discharge Instructions    CPM   Complete by:  As directed    Continuous passive motion machine (CPM):      Use the CPM from 0 to 60 for 6-8 hours per day.      You may increase by 5 per day.  You may break it up into 2 or 3 sessions per day.      Use CPM for 4 weeks or until you are told to stop.   Call MD / Call 911   Complete by:  As directed    If you experience chest pain or shortness of breath, CALL 911 and be transported to the hospital emergency room.  If you develope a fever above 101 F, pus (white drainage) or increased drainage or redness at the wound, or calf pain, call your surgeon's office.   Change dressing   Complete by:  As directed    Leave dressing in  Place-will be changed in the office at follow up   Constipation Prevention   Complete by:  As directed    Drink plenty of fluids.  Prune juice may be helpful.  You may use a stool softener, such as Colace (over the counter) 100 mg twice a day.  Use MiraLax (over the counter) for constipation as needed.   Diet general   Complete by:  As directed    Discharge instructions   Complete by:  As directed    [place] items at home which could result in a fall. This includes throw rugs or furniture in walking pathways ICE to the affected joint every three hours while awake for 30 minutes at a time, for at least the first 3-5 days, and then as needed for pain and swelling.  Continue to use ice for pain and swelling. You may notice swelling that will progress down to the foot and ankle.  This is normal after surgery.  Elevate your leg when you are not up walking on it.   Continue to use the breathing  machine you got in the hospital (incentive spirometer) which will help keep your temperature down.  It is common for your temperature to cycle up and down following surgery, especially at night when you are not up moving around and exerting yourself.  The breathing machine  keeps your lungs expanded and your temperature down.   DIET:  As you were doing prior to hospitalization, we recommend a well-balanced diet.  DRESSING / WOUND CARE / SHOWERING  Keep the surgical dressing until follow up.  The dressing is water proof, so you can shower without any extra covering.  IF THE DRESSING FALLS OFF or the wound gets wet inside, change the dressing with sterile gauze.  Please use good hand washing techniques before changing the dressing.  Do not use any lotions or creams on the incision until instructed by your surgeon.    ACTIVITY  Increase activity slowly as tolerated, but follow the weight bearing instructions below.   No driving for 6 weeks or until further direction given by your physician.  You cannot drive while taking narcotics.  No lifting or carrying greater than 10 lbs. until further directed by your surgeon. Avoid periods of inactivity such as sitting longer than an hour when not asleep. This helps prevent blood clots.  You may return to work once you are authorized by your doctor.     WEIGHT BEARING   Weight bearing as tolerated with assist device (walker, cane, etc) as directed, use it as long as suggested by your surgeon or therapist, typically at least 4-6 weeks.   EXERCISES  Results after joint replacement surgery are often greatly improved when you follow the exercise, range of motion and muscle strengthening exercises prescribed by your doctor. Safety measures are also important to protect the joint from further injury. Any time any of these exercises cause you to have increased pain or swelling, decrease what you are doing until you are comfortable again and then slowly increase them. If you have problems or questions, call your caregiver or physical therapist for advice.   Rehabilitation is important following a joint replacement. After just a few days of immobilization, the muscles of the leg can become weakened and shrink (atrophy).  These  exercises are designed to build up the tone and strength of the thigh and leg muscles and to improve motion. Often times heat used for twenty to thirty minutes before working out will loosen up your tissues and help with improving the range of motion but do not use heat for the first two weeks following surgery (sometimes heat can increase post-operative swelling).   These exercises can be done on a training (exercise) mat, on the floor, on a table or on a bed. Use whatever works the best and is most comfortable for you.    Use music or television while you are exercising so that the exercises are a pleasant break in your day. This will make your life better with the exercises acting as a break in your routine that you can look forward to.   Perform all exercises about fifteen times, three times per day or as directed.  You should exercise both the operative leg and the other leg as well.   Exercises include:  Quad Sets - Tighten up the muscle on the front of the thigh (Quad) and hold for 5-10 seconds.   Straight Leg Raises - With your knee straight (if you were given a brace, keep it on), lift the leg to 60 degrees, hold  for 3 seconds, and slowly lower the leg.  Perform this exercise against resistance later as your leg gets stronger.  Leg Slides: Lying on your back, slowly slide your foot toward your buttocks, bending your knee up off the floor (only go as far as is comfortable). Then slowly slide your foot back down until your leg is flat on the floor again.  Angel Wings: Lying on your back spread your legs to the side as far apart as you can without causing discomfort.  Hamstring Strength:  Lying on your back, push your heel against the floor with your leg straight by tightening up the muscles of your buttocks.  Repeat, but this time bend your knee to a comfortable angle, and push your heel against the floor.  You may put a pillow under the heel to make it more comfortable if necessary.   A  rehabilitation program following joint replacement surgery can speed recovery and prevent re-injury in the future due to weakened muscles. Contact your doctor or a physical therapist for more information on knee rehabilitation.    CONSTIPATION  Constipation is defined medically as fewer than three stools per week and severe constipation as less than one stool per week.  Even if you have a regular bowel pattern at home, your normal regimen is likely to be disrupted due to multiple reasons following surgery.  Combination of anesthesia, postoperative narcotics, change in appetite and fluid intake all can affect your bowels.   YOU MUST use at least one of the following options; they are listed in order of increasing strength to get the job done.  They are all available over the counter, and you may need to use some, POSSIBLY even all of these options:    Drink plenty of fluids (prune juice may be helpful) and high fiber foods Colace 100 mg by mouth twice a day  Senokot for constipation as directed and as needed Dulcolax (bisacodyl), take with full glass of water  Miralax (polyethylene glycol) once or twice a day as needed.  If you have tried all these things and are unable to have a bowel movement in the first 3-4 days after surgery call either your surgeon or your primary doctor.    If you experience loose stools or diarrhea, hold the medications until you stool forms back up.  If your symptoms do not get better within 1 week or if they get worse, check with your doctor.  If you experience "the worst abdominal pain ever" or develop nausea or vomiting, please contact the office immediately for further recommendations for treatment.   ITCHING:  If you experience itching with your medications, try taking only a single pain pill, or even half a pain pill at a time.  You can also use Benadryl over the counter for itching or also to help with sleep.   TED HOSE STOCKINGS:  Use stockings on both legs until  for at least 2 weeks or as directed by physician office. They may be removed at night for sleeping.  MEDICATIONS:  See your medication summary on the "After Visit Summary" that nursing will review with you.  You may have some home medications which will be placed on hold until you complete the course of blood thinner medication.  It is important for you to complete the blood thinner medication as prescribed.  PRECAUTIONS:  If you experience chest pain or shortness of breath - call 911 immediately for transfer to the hospital emergency department.   If you  develop a fever greater that 101 F, purulent drainage from wound, increased redness or drainage from wound, foul odor from the wound/dressing, or calf pain - CONTACT YOUR SURGEON.                                                   FOLLOW-UP APPOINTMENTS:  If you do not already have a post-op appointment, please call the office for an appointment to be seen by your surgeon.  Guidelines for how soon to be seen are listed in your "After Visit Summary", but are typically between 1-4 weeks after surgery.  OTHER INSTRUCTIONS:   Knee Replacement:  Do not place pillow under knee, focus on keeping the knee straight while resting. CPM instructions: 0-90 degrees, 2 hours in the morning, 2 hours in the afternoon, and 2 hours in the evening. Place foam block, curve side up under heel at all times except when in CPM or when walking.  DO NOT modify, tear, cut, or change the foam block in any way.  MAKE SURE YOU:  Understand these instructions.  Get help right away if you are not doing well or get worse.    Thank you for letting us be a part of your medical care team.  It is a privilege we respect greatly.  We hope these instructions will help you stay on track for a fast and full recovery!   Do not put a pillow under the knee. Place it under the heel.   Complete by:  As directed    Driving restrictions   Complete by:  As directed    No driving for6 weeks    Increase activity slowly as tolerated   Complete by:  As directed    Lifting restrictions   Complete by:  As directed    No lifting for 6 weeks   Patient may shower   Complete by:  As directed    You may shower over the brown dressing. Do not remove the dressing-will be changed on first office visit   TED hose   Complete by:  As directed    Use stockings (TED hose) for 4 weeks on both leg(s).  You may remove them at night for sleeping.   Weight bearing as tolerated   Complete by:  As directed    Laterality:  right   Extremity:  Lower      DISCHARGE MEDICATIONS:   Allergies as of 06/28/2017   No Known Allergies     Medication List    STOP taking these medications   amoxicillin-clavulanate 875-125 MG tablet Commonly known as:  AUGMENTIN   naproxen sodium 220 MG tablet Commonly known as:  ALEVE   predniSONE 20 MG tablet Commonly known as:  DELTASONE     TAKE these medications   ADVAIR DISKUS 250-50 MCG/DOSE Aepb Generic drug:  Fluticasone-Salmeterol Inhale 1 puff into the lungs 2 (two) times daily.   CALCIUM 600+D3 PO Take 1 tablet by mouth daily.   calcium carbonate 750 MG chewable tablet Commonly known as:  TUMS EX Chew 1-2 tablets by mouth 3 (three) times daily as needed (for heartburn/indigestion.).   COMBIVENT RESPIMAT 20-100 MCG/ACT Aers respimat Generic drug:  Ipratropium-Albuterol Inhale 1 puff into the lungs every 4 (four) hours as needed ((typically 4x's daily)).   Melatonin 5 MG Tabs Take 5 mg by mouth at  bedtime as needed (for sleep.).   methocarbamol 500 MG tablet Commonly known as:  ROBAXIN Take 1 tablet (500 mg total) by mouth every 8 (eight) hours as needed for muscle spasms.   multivitamin with minerals Tabs tablet Take 1 tablet by mouth daily.   oxyCODONE 5 MG immediate release tablet Commonly known as:  Oxy IR/ROXICODONE Take 1-2 tablets (5-10 mg total) by mouth every 4 (four) hours as needed for moderate pain (pain score 4-6).    OXYGEN Inhale 2 L into the lungs at bedtime.   rivaroxaban 10 MG Tabs tablet Commonly known as:  XARELTO Take 1 tablet (10 mg total) by mouth daily with breakfast.   simvastatin 20 MG tablet Commonly known as:  ZOCOR Take 20 mg by mouth daily with supper.   triamcinolone cream 0.1 % Commonly known as:  KENALOG Apply 1 application topically 2 (two) times daily as needed (for skin irritation/contact dermatitis).            Durable Medical Equipment  (From admission, onward)        Start     Ordered   06/27/17 1053  DME Walker rolling  Once    Question:  Patient needs a walker to treat with the following condition  Answer:  S/P total knee arthroplasty, right   06/27/17 1052   06/27/17 1053  DME 3 n 1  Once     06/27/17 1052   06/27/17 1053  DME Bedside commode  Once    Question:  Patient needs a bedside commode to treat with the following condition  Answer:  S/P TKR (total knee replacement) using cement, right   06/27/17 1052       Discharge Care Instructions  (From admission, onward)        Start     Ordered   06/28/17 0000  Weight bearing as tolerated    Question Answer Comment  Laterality right   Extremity Lower      06/28/17 0755   06/28/17 0000  Change dressing    Comments:  Leave dressing in  Place-will be changed in the office at follow up   06/28/17 0755      FOLLOW UP VISIT:   Follow-up Information    Garald Balding, MD Follow up on 07/10/2017.   Specialty:  Orthopedic Surgery Contact information: [address] [place] [address] [phone]           DISPOSITION:   Home  CONDITION:  Stable   Mike Craze. [place], [place] [phone]  06/28/2017 7:59 AM

## 2017-06-28 NOTE — Care Management Note (Signed)
Case Management Note  Patient Details  Name: Joanne Kramer MRN: [id number] Date of Birth: 06-Nov-1945  Subjective/Objective:      Right TKA              Action/Plan: NCM spoke to pt and husband at bedside. Offered choice for HH/list provided. Pt requested [facility] for HH. [employer] delivered RW to room and will deliver 3n1 bedside commode and CPM to home when dc.   Expected Discharge Date:  06/29/17               Expected Discharge Plan:  [place]  In-House Referral:  NA  Discharge planning Services  CM Consult  Post Acute Care Choice:  Home Health Choice offered to:  Patient  DME Arranged:  Walker rolling, CPM, 3-N-1 DME Agency:  [employer]  HH Arranged:  PT HH Agency:  [facility]  Status of Service:  Completed, signed off  If discussed at [place] of Stay Meetings, dates discussed:    Additional Comments:  Erenest Rasher, RN 06/28/2017, 10:53 AM

## 2017-06-28 NOTE — Progress Notes (Signed)
PATIENT ID: Joanne Kramer        MRN:  [id number]          DOB/AGE: [birth year] / 72 y.o.    Joni Fears, MD   Biagio Borg, PA-C [address] [place], [place]  [address]                             [phone]   PROGRESS NOTE  Subjective:  negative for Chest Pain  negative for Shortness of Breath  negative for Nausea/Vomiting   negative for Calf Pain    Tolerating Diet: yes         Patient reports pain as mild.     Awake, alert this am-difficult time yesterday after surgery related to pain meds-groggy.Fine this am  Objective: Vital signs in last 24 hours:    Patient Vitals for the past 24 hrs:  BP Temp Temp src Pulse Resp SpO2  06/28/17 0511 (!) 103/58 98.6 F (37 C) Oral 76 - 94 %  06/28/17 0040 (!) 101/58 98.6 F (37 C) Oral 85 - 92 %  06/27/17 1947 (!) 111/52 98.2 F (36.8 C) Oral 79 - 94 %  06/27/17 1542 (!) 119/53 - - 80 - 94 %  06/27/17 1140 - - - - - 100 %  06/27/17 1136 125/72 - - 60 14 100 %  06/27/17 1131 120/76 - - - - -  06/27/17 1127 (!) 137/104 97.7 F (36.5 C) - (!) 54 - -  06/27/17 1125 (!) 122/109 - - 60 - 100 %  06/27/17 1121 (!) 118/106 97.7 F (36.5 C) Oral 68 - 94 %  06/27/17 1107 (!) 134/117 (!) 97.3 F (36.3 C) Oral 72 - 92 %  06/27/17 1046 (!) 142/83 - - 63 (!) 23 98 %  06/27/17 1045 - 97.9 F (36.6 C) - 61 (!) 21 97 %  06/27/17 1031 (!) 165/79 - - 72 18 96 %  06/27/17 1030 - - - 72 19 97 %  06/27/17 1016 (!) 151/87 - - 65 12 100 %  06/27/17 1015 - - - 62 12 99 %  06/27/17 1006 (!) 142/75 - - 64 17 93 %  06/27/17 1001 (!) 142/112 - - - 19 -  06/27/17 1000 - - - 82 (!) 21 100 %  06/27/17 0946 (!) 147/75 - - 78 18 96 %  06/27/17 0945 - (!) 96.8 F (36 C) - 87 - 95 %      Intake/Output from previous day:   04/30 0701 - 05/01 0700 In: 2513.8 [I.V.:2313.8] Out: 450 [Urine:400]   Intake/Output this shift:   No intake/output data recorded.   Intake/Output      04/30 0701 - 05/01 0700 05/01 0701 - 05/02 0700   I.V. (mL/kg)  2313.8 (28.4)    IV Piggyback 200    Total Intake(mL/kg) 2513.8 (30.8)    Urine (mL/kg/hr) 400 (0.2)    Blood 50    Total Output 450    Net +2063.8            LABORATORY DATA: Recent Labs    06/28/17 0441  WBC 12.2*  HGB 10.6*  HCT 35.3*  PLT 197   Recent Labs    06/28/17 0441  NA 137  K 4.3  CL 103  CO2 30  BUN 9  CREATININE 0.57  GLUCOSE 141*  CALCIUM 8.2*   Lab Results  Component Value Date   INR 1.08 06/16/2017  Recent Radiographic Studies :  Nm Myocar Multi W/spect W/wall Motion / Ef  Result Date: 06/16/2017  There was no ST segment deviation noted during stress.  The study is normal. There are no perfusion defects.  This is a low risk study.  The left ventricular ejection fraction is normal (55-65%).      Examination:  General appearance: alert, cooperative and no distress  Wound Exam: clean, dry, intact   Drainage:  None: wound tissue dry  Motor Exam: EHL, FHL, Anterior Tibial and Posterior Tibial Intact  Sensory Exam: Superficial Peroneal, Deep Peroneal and Tibial normal  Vascular Exam: Normal  Assessment:    1 Day Post-Op  Procedure(s) (LRB): RIGHT TOTAL KNEE ARTHROPLASTY (Right)  ADDITIONAL DIAGNOSIS:  Active Problems:   Primary osteoarthritis of right knee   Total knee replacement status, right  Acute Blood Loss Anemia-asymptomatic   Plan: Physical Therapy as ordered Partial Weight Bearing @ 50% (PWB)  DVT Prophylaxis:  Xarelto, Foot Pumps and TED hose  DISCHARGE PLAN: Home  DISCHARGE NEEDS: HHPT, CPM, Walker and 3-in-1 comode seat OOB with PT, D/C foley,saline lock IV-hope for D/C tomorrow       Biagio Borg, PA-C [facility]  06/28/2017 7:35 AM  Patient ID: Joanne Kramer, female   DOB: Feb 19, 1946, 72 y.o.   MRN: [id number]

## 2017-06-28 NOTE — Evaluation (Addendum)
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 Pt is currently 50% PWB on the operative side. PMH significant for emphysema, COPD, cataracts.  Clinical Impression  Pt admitted with above diagnosis. Pt currently with functional limitations due to the deficits listed below (see PT Problem List). At the time of PT eval pt was able to perform transfers and ambulation with gross min guard assist for balance support and safety. Pt anticipates d/c home tomorrow, however will need to demonstrate improved ambulation distance and safe stair negotiation . Anticipate she will progress well with post-op mobility. Acutely, pt will benefit from skilled PT to increase their independence and safety with mobility to allow discharge to the venue listed below.       Follow Up Recommendations Follow surgeon's recommendation for DC plan and follow-up therapies    Equipment Recommendations  Rolling walker with 5" wheels    Recommendations for Other Services       Precautions / Restrictions Precautions Precautions: Fall;Knee Precaution Comments: Pt was educated on NO pillow, roll, ice pack, etc under knee, and to rest with knee in extension.  Restrictions Weight Bearing Restrictions: Yes RLE Weight Bearing: Partial weight bearing RLE Partial Weight Bearing Percentage or Pounds: 50%      Mobility  Bed Mobility Overal bed mobility: Needs Assistance Bed Mobility: Supine to Sit     Supine to sit: Min assist     General bed mobility comments: Pt able to advance LE's out towards EOB without assist. Therapist provided support on operative leg as she scooted out fully to EOB and placed foot on floor.   Transfers Overall transfer level: Needs assistance Equipment used: Rolling walker (2 wheeled) Transfers: Sit to/from Stand Sit to Stand: Min  guard         General transfer comment: Hands-on guarding for safety as pt powered up to full standing position.   Ambulation/Gait Ambulation/Gait assistance: Min guard Ambulation Distance (Feet): 50 Feet Assistive device: Rolling walker (2 wheeled) Gait Pattern/deviations: Step-through pattern;Decreased stride length;Trunk flexed Gait velocity: Decreased Gait velocity interpretation: <1.8 ft/sec, indicate of risk for recurrent falls General Gait Details: VC's for improved posture, sequencing with the RW, and maintenance of 50% PWB status on the RLE. Pt very painful throughout gait training and required hands-on guarding for safety due to unsteadiness.   Stairs            Wheelchair Mobility    Modified Rankin (Stroke Patients Only)       Balance Overall balance assessment: Needs assistance Sitting-balance support: Feet supported;No upper extremity supported Sitting balance-Leahy Scale: Fair     Standing balance support: During functional activity;Bilateral upper extremity supported Standing balance-Leahy Scale: Poor Standing balance comment: Reliant on UE support during functional mobility                             Pertinent Vitals/Pain Pain Assessment: Faces Faces Pain Scale: Hurts whole lot Pain Location: R knee Pain Descriptors / Indicators: Operative site guarding;Aching;Crying Pain Intervention(s): Limited activity within patient's tolerance;Monitored during session;Repositioned    Home Living Family/patient expects to be discharged to:: Private residence Living Arrangements: Spouse/significant other Available Help at Discharge: Family;Available 24 hours/day Type of Home: House Home Access: Stairs to enter Entrance Stairs-Rails: Can reach both Entrance Stairs-Number of Steps: 3-5 Home Layout: One  level Home Equipment: Grab bars - toilet;Grab bars - tub/shower;Shower seat;Cane - single point      Prior Function Level of Independence:  Independent               Hand Dominance   Dominant Hand: Right    Extremity/Trunk Assessment                Communication   Communication: No difficulties  Cognition Arousal/Alertness: Awake/alert Behavior During Therapy: WFL for tasks assessed/performed Overall Cognitive Status: Within Functional Limits for tasks assessed                                        General Comments      Exercises Total Joint Exercises Ankle Circles/Pumps: 20 reps Quad Sets: 10 reps Short Arc Quad: 10 reps Heel Slides: 10 reps Goniometric ROM: 14-78 AROM in sitting   Assessment/Plan    PT Assessment    PT Problem List         PT Treatment Interventions      PT Goals (Current goals can be found in the Care Plan section)  Acute Rehab PT Goals Patient Stated Goal: Home tomorrow PT Goal Formulation: With patient/family Time For Goal Achievement: 07/05/17 Potential to Achieve Goals: Good    Frequency 7X/week   Barriers to discharge        Co-evaluation               AM-PAC PT "6 Clicks" Daily Activity  Outcome Measure Difficulty turning over in bed (including adjusting bedclothes, sheets and blankets)?: A Little Difficulty moving from lying on back to sitting on the side of the bed? : A Little Difficulty sitting down on and standing up from a chair with arms (e.g., wheelchair, bedside commode, etc,.)?: A Little Help needed moving to and from a bed to chair (including a wheelchair)?: A Little Help needed walking in hospital room?: A Little Help needed climbing 3-5 steps with a railing? : A Lot 6 Click Score: 17    End of Session Equipment Utilized During Treatment: Gait belt Activity Tolerance: Patient tolerated treatment well Patient left: in chair;with call bell/phone within reach;with family/visitor present Nurse Communication: Mobility status PT Visit Diagnosis: Pain;Unsteadiness on feet (R26.81);Other abnormalities of gait and mobility  (R26.89) Pain - Right/Left: Right Pain - part of body: Knee    Time: 3254-9826 PT Time Calculation (min) (ACUTE ONLY): 44 min   Charges:   PT Evaluation $PT Eval Moderate Complexity: 1 Mod PT Treatments $Gait Training: 8-22 mins $Therapeutic Exercise: 8-22 mins   PT G Codes:        Joanne Kramer, PT, DPT Acute Rehabilitation Services Pager: [phone]   Thelma Comp 06/28/2017, 12:41 PM

## 2017-06-28 NOTE — Progress Notes (Signed)
Physical Therapy Treatment Patient Details Name: Joanne Kramer MRN: [id number] DOB: September 05, 1945 Today's Date: 06/28/2017    History of Present Illness Pt is a 72 y/o female who presents s/p R TKA on 06/27/17. Pt is currently 50% PWB on the operative side. PMH significant for emphysema, COPD, cataracts.    PT Comments    Pt progressing towards physical therapy goals. Limited by pain this session however overall good rehab effort. Will attempt stair training next session as pt anticipates d/c home tomorrow. Will continue to follow.    Follow Up Recommendations  Follow surgeon's recommendation for DC plan and follow-up therapies     Equipment Recommendations  Rolling walker with 5" wheels    Recommendations for Other Services       Precautions / Restrictions Precautions Precautions: Fall;Knee Precaution Comments: Pt was educated on NO pillow, roll, ice pack, etc under knee, and to rest with knee in extension.  Restrictions Weight Bearing Restrictions: Yes RLE Weight Bearing: Partial weight bearing RLE Partial Weight Bearing Percentage or Pounds: 50%    Mobility  Bed Mobility Overal bed mobility: Needs Assistance Bed Mobility: Sit to Supine     Supine to sit: Min assist Sit to supine: Mod assist   General bed mobility comments: Mod assist to elevate LE's up into the bed. Pt was educated in using non-operative leg to assist with elevation.   Transfers Overall transfer level: Needs assistance Equipment used: Rolling walker (2 wheeled) Transfers: Sit to/from Stand Sit to Stand: Min guard         General transfer comment: Hands-on guarding for safety as pt powered up to full standing position.   Ambulation/Gait Ambulation/Gait assistance: Min guard Ambulation Distance (Feet): 100 Feet Assistive device: Rolling walker (2 wheeled) Gait Pattern/deviations: Step-through pattern;Decreased stride length;Trunk flexed Gait velocity: Decreased Gait velocity interpretation:  <1.8 ft/sec, indicate of risk for recurrent falls General Gait Details: VC's for improved posture, sequencing with the RW, and maintenance of 50% PWB status on the RLE. Pt very painful throughout gait training and required hands-on guarding for safety due to unsteadiness.    Stairs             Wheelchair Mobility    Modified Rankin (Stroke Patients Only)       Balance Overall balance assessment: Needs assistance Sitting-balance support: Feet supported;No upper extremity supported Sitting balance-Leahy Scale: Fair     Standing balance support: During functional activity;Bilateral upper extremity supported Standing balance-Leahy Scale: Poor Standing balance comment: Reliant on UE support during functional mobility                            Cognition Arousal/Alertness: Awake/alert Behavior During Therapy: WFL for tasks assessed/performed Overall Cognitive Status: Within Functional Limits for tasks assessed                                        Exercises Total Joint Exercises Ankle Circles/Pumps: 20 reps Quad Sets: 10 reps Short Arc Quad: 10 reps Heel Slides: 10 reps Goniometric ROM: 14-78 AROM in sitting Other Exercises Other Exercises: HEP reviewed verbally. Pt was too painful to tolerate therapeutic exercise at end of session.     General Comments        Pertinent Vitals/Pain Pain Assessment: 0-10 Pain Score: 4  Faces Pain Scale: Hurts whole lot(Appeared to be extremely painful but rates as a 4/10)  Pain Location: R knee Pain Descriptors / Indicators: Operative site guarding;Aching;Crying Pain Intervention(s): Monitored during session    Home Living Family/patient expects to be discharged to:: Private residence Living Arrangements: Spouse/significant other Available Help at Discharge: Family;Available 24 hours/day Type of Home: House Home Access: Stairs to enter Entrance Stairs-Rails: Can reach both Home Layout: One  level Home Equipment: Grab bars - toilet;Grab bars - tub/shower;Shower seat;Cane - single point      Prior Function Level of Independence: Independent          PT Goals (current goals can now be found in the care plan section) Acute Rehab PT Goals Patient Stated Goal: Home tomorrow PT Goal Formulation: With patient/family Time For Goal Achievement: 07/05/17 Potential to Achieve Goals: Good Progress towards PT goals: Progressing toward goals    Frequency    7X/week      PT Plan Current plan remains appropriate    Co-evaluation              AM-PAC PT "6 Clicks" Daily Activity  Outcome Measure  Difficulty turning over in bed (including adjusting bedclothes, sheets and blankets)?: A Little Difficulty moving from lying on back to sitting on the side of the bed? : A Little Difficulty sitting down on and standing up from a chair with arms (e.g., wheelchair, bedside commode, etc,.)?: A Little Help needed moving to and from a bed to chair (including a wheelchair)?: A Little Help needed walking in hospital room?: A Little Help needed climbing 3-5 steps with a railing? : A Lot 6 Click Score: 17    End of Session Equipment Utilized During Treatment: Gait belt Activity Tolerance: Patient tolerated treatment well Patient left: in bed;with call bell/phone within reach;with family/visitor present Nurse Communication: Mobility status PT Visit Diagnosis: Pain;Unsteadiness on feet (R26.81);Other abnormalities of gait and mobility (R26.89) Pain - Right/Left: Right Pain - part of body: Knee     Time: 1345-1414 PT Time Calculation (min) (ACUTE ONLY): 29 min  Charges:  $Gait Training: 23-37 mins $Therapeutic Exercise: 8-22 mins                    G Codes:       Rolinda Roan, PT, DPT Acute Rehabilitation Services Pager: [phone]    Thelma Comp 06/28/2017, 3:11 PM

## 2017-06-29 LAB — BASIC METABOLIC PANEL
Anion gap: 7 (ref 5–15)
BUN: 11 mg/dL (ref 6–20)
CHLORIDE: 99 mmol/L — AB (ref 101–111)
CO2: 32 mmol/L (ref 22–32)
Calcium: 8.5 mg/dL — ABNORMAL LOW (ref 8.9–10.3)
Creatinine, Ser: 0.61 mg/dL (ref 0.44–1.00)
GFR calc Af Amer: >60 mL/min (ref >60)
GFR calc non Af Amer: >60 mL/min (ref >60)
Glucose, Bld: 101 mg/dL — ABNORMAL HIGH (ref 65–99)
POTASSIUM: 4.2 mmol/L (ref 3.5–5.1)
SODIUM: 138 mmol/L (ref 135–145)

## 2017-06-29 LAB — CBC
HCT: 34.4 % — ABNORMAL LOW (ref 36.0–46.0)
Hemoglobin: 10.5 g/dL — ABNORMAL LOW (ref 12.0–15.0)
MCH: 29.7 pg (ref 26.0–34.0)
MCHC: 30.5 g/dL (ref 30.0–36.0)
MCV: 97.5 fL (ref 78.0–100.0)
Platelets: 198 10*3/uL (ref 150–400)
RBC: 3.53 MIL/uL — AB (ref 3.87–5.11)
RDW: 15 % (ref 11.5–15.5)
WBC: 11.8 10*3/uL — ABNORMAL HIGH (ref 4.0–10.5)

## 2017-06-29 NOTE — Evaluation (Signed)
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 Pt is currently 50% PWB on the operative side. PMH significant for emphysema, COPD, cataracts.   Clinical Impression   Pt admitted with the above diagnoses and presents with below problem list. Pt will benefit from continued acute OT to address the below listed deficits and maximize independence with basic ADLs prior to d/c home. PTA pt was independent with ADLs. Pt currently limited by 10/10 (FACES) pain level though very motivated to try to mobilize. Pt min to mod A with LB ADLs and min A for pivotal steps to/from [facility]. Feel pt would benefit from another night in acute care to work on bringing her pain level down. Feel she will progress quickly with therapies once pain decreases to a more tolerable range.       Follow Up Recommendations  Follow surgeon's recommendation for DC plan and follow-up therapies    Equipment Recommendations  3 in 1 bedside commode    Recommendations for Other Services       Precautions / Restrictions Precautions Precautions: Fall;Knee Restrictions Weight Bearing Restrictions: Yes RLE Weight Bearing: Partial weight bearing RLE Partial Weight Bearing Percentage or Pounds: 50%      Mobility Bed Mobility Overal bed mobility: Needs Assistance Bed Mobility: Supine to Sit;Sit to Supine     Supine to sit: Min assist Sit to supine: Mod assist   General bed mobility comments: Assist to advance LE  Transfers Overall transfer level: Needs assistance Equipment used: Rolling walker (2 wheeled) Transfers: Sit to/from [facility] Sit to Stand: Min assist;From elevated surface Stand pivot transfers: Min assist       General transfer comment: Assist to steady. Pain a limiting factor.    Balance Overall balance assessment: Needs  assistance Sitting-balance support: Feet supported;No upper extremity supported Sitting balance-Leahy Scale: Fair     Standing balance support: During functional activity;Bilateral upper extremity supported Standing balance-Leahy Scale: Poor Standing balance comment: Reliant on UE support during functional mobility. Noted trunk flexion due to pain.                            ADL either performed or assessed with clinical judgement   ADL Overall ADL's : Needs assistance/impaired Eating/Feeding: Set up;Sitting   Grooming: Set up;Sitting   Upper Body Bathing: Set up;Sitting   Lower Body Bathing: Moderate assistance;Sit to/from stand   Upper Body Dressing : Set up;Sitting   Lower Body Dressing: Moderate assistance;Sit to/from stand   Toilet Transfer: Minimal assistance;Stand-pivot;BSC;RW Toilet Transfer Details (indicate cue type and reason): pt attempted to walk to bathroom but could only walk to foot of bed due to pain. Ultimately SPT to [facility].  Toileting- Clothing Manipulation and Hygiene: Set up;Sitting/lateral lean;Moderate assistance;Sit to/from stand         General ADL Comments: Pt limited by 10/10 (FACES) pain. Walked to foot of bed, SPT to/from [facility]. Limited LB ADL education due to pain level this session.      Vision         Perception     Praxis      Pertinent Vitals/Pain Pain Assessment: Faces Faces Pain Scale: Hurts worst(rates as 6 but grimacing, moaning, crying) Pain Location: R knee Pain Descriptors / Indicators: Operative site guarding;Aching;Crying;Jabbing Pain Intervention(s): Limited activity within patient's tolerance;Monitored during  session;Repositioned;RN gave pain meds during session;Ice applied;Utilized relaxation techniques;Patient requesting pain meds-RN notified     Hand Dominance Right   Extremity/Trunk Assessment Upper Extremity Assessment Upper Extremity Assessment: Overall WFL for tasks assessed   Lower Extremity  Assessment Lower Extremity Assessment: Defer to PT evaluation       Communication Communication Communication: No difficulties   Cognition Arousal/Alertness: Awake/alert Behavior During Therapy: WFL for tasks assessed/performed Overall Cognitive Status: Within Functional Limits for tasks assessed                                     General Comments       Exercises     Shoulder Instructions      Home Living Family/patient expects to be discharged to:: Private residence Living Arrangements: Spouse/significant other Available Help at Discharge: Family;Available 24 hours/day Type of Home: House Home Access: Stairs to enter [employer] of Steps: 3-5 Entrance Stairs-Rails: Can reach both Home Layout: One level     Bathroom Shower/Tub: Occupational psychologist: Standard     Home Equipment: Grab bars - toilet;Grab bars - tub/shower;Shower seat;Cane - single point          Prior Functioning/Environment Level of Independence: Independent                 OT Problem List: Decreased activity tolerance;Impaired balance (sitting and/or standing);Decreased knowledge of use of DME or AE;Decreased knowledge of precautions;Pain      OT Treatment/Interventions: Self-care/ADL training;DME and/or AE instruction;Therapeutic activities;Patient/family education;Balance training    OT Goals(Current goals can be found in the care plan section) Acute Rehab OT Goals Patient Stated Goal: pain under control, home when resy OT Goal Formulation: With patient Time For Goal Achievement: 07/06/17 Potential to Achieve Goals: Good ADL Goals Pt Will Perform Lower Body Bathing: with modified independence;sit to/from stand Pt Will Perform Lower Body Dressing: with modified independence;sit to/from stand Pt Will Transfer to Toilet: with modified independence;ambulating Pt Will Perform Toileting - Clothing Manipulation and hygiene: with modified  independence;sit to/from stand Pt Will Perform Tub/Shower Transfer: Shower transfer;with supervision;ambulating;3 in 1;rolling walker Additional ADL Goal #1: Pt will be mod I with bed mobility to prepare for OOB ADLs.  OT Frequency: Min 2X/week   Barriers to D/C:            Co-evaluation              AM-PAC PT "6 Clicks" Daily Activity     Outcome Measure Help from another person eating meals?: None Help from another person taking care of personal grooming?: None Help from another person toileting, which includes using toliet, bedpan, or urinal?: A Lot Help from another person bathing (including washing, rinsing, drying)?: A Lot Help from another person to put on and taking off regular upper body clothing?: None Help from another person to put on and taking off regular lower body clothing?: A Lot 6 Click Score: 18   End of Session Equipment Utilized During Treatment: Rolling walker;Gait belt Nurse Communication: Other (comment)(Pain level)  Activity Tolerance: Patient limited by pain Patient left: in bed;with call bell/phone within reach  OT Visit Diagnosis: Unsteadiness on feet (R26.81);Pain Pain - Right/Left: Right Pain - part of body: Leg                Time: 4431-5400 OT Time Calculation (min): 28 min Charges:  OT General Charges $OT Visit: 1 Visit OT  Evaluation $OT Eval Low Complexity: 1 Low OT Treatments $Self Care/Home Management : 8-22 mins G-Codes:       Hortencia Pilar 06/29/2017, 11:19 AM

## 2017-06-29 NOTE — Progress Notes (Signed)
Physical Therapy Treatment Patient Details Name: Joanne Kramer MRN: [id number] DOB: 01-24-1946 Today's Date: 06/29/2017    History of Present Illness Pt is a 72 y/o female who presents s/p R TKA on 06/27/17. Pt is currently 50% PWB on the operative side. PMH significant for emphysema, COPD, cataracts.    PT Comments    Pt with increased pain this session and difficulty performing therapeutic exercise. AAROM required for all exercise. Pt calling out and crying throughout session with minimal movement. Unable to tolerate OOB mobility at this time. Will plan for second session this afternoon to attempt mobility progression. Will continue to follow.    Follow Up Recommendations  Follow surgeon's recommendation for DC plan and follow-up therapies     Equipment Recommendations  Rolling walker with 5" wheels    Recommendations for Other Services       Precautions / Restrictions Precautions Precautions: Fall;Knee Precaution Comments: Pt was educated on NO pillow, roll, ice pack, etc under knee, and to rest with knee in extension.  Restrictions Weight Bearing Restrictions: Yes RLE Weight Bearing: Partial weight bearing RLE Partial Weight Bearing Percentage or Pounds: 50%    Mobility  Bed Mobility Overal bed mobility: Needs Assistance Bed Mobility: Supine to Sit;Sit to Supine     Supine to sit: Min assist Sit to supine: Mod assist   General bed mobility comments: Unable to tolerate  Transfers Overall transfer level: Needs assistance Equipment used: Rolling walker (2 wheeled) Transfers: Sit to/from [facility] Sit to Stand: Min assist;From elevated surface Stand pivot transfers: Min assist       General transfer comment: Unable to tolerate  Ambulation/Gait             General Gait Details: Unable to tolerate   Stairs             Wheelchair Mobility    Modified Rankin (Stroke Patients Only)       Balance Overall balance assessment:  Needs assistance Sitting-balance support: Feet supported;No upper extremity supported Sitting balance-Leahy Scale: Fair     Standing balance support: During functional activity;Bilateral upper extremity supported Standing balance-Leahy Scale: Poor Standing balance comment: Reliant on UE support during functional mobility. Noted trunk flexion due to pain.                             Cognition Arousal/Alertness: Awake/alert Behavior During Therapy: Flat affect Overall Cognitive Status: Within Functional Limits for tasks assessed                                        Exercises Total Joint Exercises Ankle Circles/Pumps: 20 reps Quad Sets: 10 reps Short Arc Quad: 10 reps;AAROM Heel Slides: 10 reps;AAROM Hip ABduction/ADduction: 10 reps;AAROM Goniometric ROM: grossly 25-30 AAROM in supine    General Comments        Pertinent Vitals/Pain Pain Assessment: Faces Faces Pain Scale: Hurts worst Pain Location: R knee Pain Descriptors / Indicators: Operative site guarding;Aching;Crying;Jabbing Pain Intervention(s): Limited activity within patient's tolerance;Monitored during session;Repositioned;Premedicated before session    Home Living Family/patient expects to be discharged to:: Private residence Living Arrangements: Spouse/significant other Available Help at Discharge: Family;Available 24 hours/day Type of Home: House Home Access: Stairs to enter Entrance Stairs-Rails: Can reach both Home Layout: One level Home Equipment: Grab bars - toilet;Grab bars - tub/shower;Shower seat;Cane - single point  Prior Function Level of Independence: Independent          PT Goals (current goals can now be found in the care plan section) Acute Rehab PT Goals Patient Stated Goal: Pain control PT Goal Formulation: With patient/family Time For Goal Achievement: 07/05/17 Potential to Achieve Goals: Good Progress towards PT goals: Not progressing toward goals  - comment(Unable to tolerate OOB this session)    Frequency    7X/week      PT Plan Current plan remains appropriate    Co-evaluation              AM-PAC PT "6 Clicks" Daily Activity  Outcome Measure  Difficulty turning over in bed (including adjusting bedclothes, sheets and blankets)?: A Little Difficulty moving from lying on back to sitting on the side of the bed? : A Little Difficulty sitting down on and standing up from a chair with arms (e.g., wheelchair, bedside commode, etc,.)?: A Little Help needed moving to and from a bed to chair (including a wheelchair)?: A Little Help needed walking in hospital room?: A Little Help needed climbing 3-5 steps with a railing? : A Lot 6 Click Score: 17    End of Session         PT Visit Diagnosis: Pain;Unsteadiness on feet (R26.81);Other abnormalities of gait and mobility (R26.89) Pain - Right/Left: Right Pain - part of body: Knee     Time: 1030-1100 PT Time Calculation (min) (ACUTE ONLY): 30 min  Charges:  $Therapeutic Exercise: 23-37 mins                    G Codes:       Rolinda Roan, PT, DPT Acute Rehabilitation Services Pager: [phone]    Thelma Comp 06/29/2017, 12:24 PM

## 2017-06-29 NOTE — Progress Notes (Signed)
Physical Therapy Treatment Patient Details Name: Joanne Kramer MRN: [id number] DOB: 03-15-1945 Today's Date: 06/29/2017    History of Present Illness Pt is a 72 y/o female who presents s/p R TKA on 06/27/17. Pt is currently 50% PWB on the operative side. PMH significant for emphysema, COPD, cataracts.    PT Comments    Pt progressing towards physical therapy goals. Was able to perform transfers and ambulation with gross min guard assist to min assist for LE movement/support. Pt continues to be significantly limited by pain and we were not able to progress to stair training this date. Pt will need to demonstrate safe stair negotiation prior to d/c home as she is at a high risk of falls at this time. Recommend one more day in the hospital so therapies can follow up accordingly.   Follow Up Recommendations  Follow surgeon's recommendation for DC plan and follow-up therapies     Equipment Recommendations  Rolling walker with 5" wheels    Recommendations for Other Services       Precautions / Restrictions Precautions Precautions: Fall;Knee Precaution Comments: Pt was educated on NO pillow, roll, ice pack, etc under knee, and to rest with knee in extension.  Restrictions Weight Bearing Restrictions: Yes RLE Weight Bearing: Partial weight bearing RLE Partial Weight Bearing Percentage or Pounds: 50%    Mobility  Bed Mobility Overal bed mobility: Needs Assistance Bed Mobility: Supine to Sit     Supine to sit: Min assist     General bed mobility comments: Assist for RLE advancement towards EOB and support to bring LE down to the floor  Transfers Overall transfer level: Needs assistance Equipment used: Rolling walker (2 wheeled) Transfers: Sit to/from Stand Sit to Stand: Min guard;From elevated surface         General transfer comment: VC's for hand placement on seated surface for safety. Pt was able to power-up to full stand with hands on guarding for support.    Ambulation/Gait Ambulation/Gait assistance: Min guard Ambulation Distance (Feet): 80 Feet Assistive device: Rolling walker (2 wheeled) Gait Pattern/deviations: Step-to pattern;Decreased stride length;Trunk flexed Gait velocity: Decreased Gait velocity interpretation: <1.8 ft/sec, indicate of risk for recurrent falls General Gait Details: Slow and extremely guarded. Several standing rest breaks required due to pain. Pt was cued for increased heel strike, more even step/stride length, and closer proximity to walker.    Stairs             Wheelchair Mobility    Modified Rankin (Stroke Patients Only)       Balance Overall balance assessment: Needs assistance Sitting-balance support: Feet supported;No upper extremity supported Sitting balance-Leahy Scale: Fair     Standing balance support: During functional activity;Bilateral upper extremity supported Standing balance-Leahy Scale: Poor Standing balance comment: Reliant on UE support during functional mobility. Noted trunk flexion due to pain.                             Cognition Arousal/Alertness: Awake/alert Behavior During Therapy: Flat affect Overall Cognitive Status: Within Functional Limits for tasks assessed                                        Exercises Total Joint Exercises Ankle Circles/Pumps: 20 reps Quad Sets: 10 reps Short Arc Quad: 10 reps;AAROM Heel Slides: 10 reps;Seated Hip ABduction/ADduction: 10 reps;AAROM Goniometric ROM: grossly 25-30 AAROM  in supine    General Comments        Pertinent Vitals/Pain Pain Assessment: 0-10 Pain Score: 4  Faces Pain Scale: Hurts whole lot(Pt reports 4/10 but faces pain scale is 8/10) Pain Location: R knee Pain Descriptors / Indicators: Operative site guarding;Aching;Crying;Jabbing Pain Intervention(s): Monitored during session;Limited activity within patient's tolerance    Home Living                      Prior  Function            PT Goals (current goals can now be found in the care plan section) Acute Rehab PT Goals Patient Stated Goal: Pain control PT Goal Formulation: With patient/family Time For Goal Achievement: 07/05/17 Potential to Achieve Goals: Good Progress towards PT goals: Progressing toward goals    Frequency    7X/week      PT Plan Current plan remains appropriate    Co-evaluation              AM-PAC PT "6 Clicks" Daily Activity  Outcome Measure  Difficulty turning over in bed (including adjusting bedclothes, sheets and blankets)?: A Little Difficulty moving from lying on back to sitting on the side of the bed? : A Little Difficulty sitting down on and standing up from a chair with arms (e.g., wheelchair, bedside commode, etc,.)?: A Little Help needed moving to and from a bed to chair (including a wheelchair)?: A Little Help needed walking in hospital room?: A Little Help needed climbing 3-5 steps with a railing? : A Lot 6 Click Score: 17    End of Session Equipment Utilized During Treatment: Gait belt Activity Tolerance: Patient tolerated treatment well Patient left: in chair;with call bell/phone within reach Nurse Communication: Mobility status PT Visit Diagnosis: Pain;Unsteadiness on feet (R26.81);Other abnormalities of gait and mobility (R26.89) Pain - Right/Left: Right Pain - part of body: Knee     Time: 6468-0321 PT Time Calculation (min) (ACUTE ONLY): 38 min  Charges:  $Gait Training: 38-52 mins                    G Codes:       Rolinda Roan, PT, DPT Acute Rehabilitation Services Pager: [phone]    Thelma Comp 06/29/2017, 3:10 PM

## 2017-06-29 NOTE — Plan of Care (Signed)

## 2017-06-29 NOTE — Progress Notes (Signed)
PATIENT ID: Joanne Kramer        MRN:  [id number]          DOB/AGE: [birth year] / 72 y.o.    Joni Fears, MD   Biagio Borg, PA-C [address] [place], [place]  [address]                             [phone]   PROGRESS NOTE  Subjective:  negative for Chest Pain  negative for Shortness of Breath  negative for Nausea/Vomiting   negative for Calf Pain    Tolerating Diet: yes- not much of an appetite         Patient reports pain as moderate and severe.       Objective: Vital signs in last 24 hours:    Patient Vitals for the past 24 hrs:  BP Temp Temp src Pulse SpO2  06/29/17 0531 (!) 143/81 98 F (36.7 C) - 86 93 %  06/28/17 2110 - - - - (!) 86 %  06/28/17 1949 (!) 146/76 99.3 F (37.4 C) Oral 84 (!) 84 %  06/28/17 1330 114/67 98.3 F (36.8 C) Oral 79 (!) 86 %  06/28/17 0918 - - - - 95 %      Intake/Output from previous day:   05/01 0701 - 05/02 0700 In: 840 [P.O.:840] Out: -    Intake/Output this shift:   No intake/output data recorded.   Intake/Output      05/01 0701 - 05/02 0700 05/02 0701 - 05/03 0700   P.O. 840    I.V. (mL/kg)     IV Piggyback     Total Intake(mL/kg) 840 (10.3)    Urine (mL/kg/hr)     Blood     Total Output     Net +840         Urine Occurrence 3 x       LABORATORY DATA: Recent Labs    06/28/17 0441 06/29/17 0355  WBC 12.2* 11.8*  HGB 10.6* 10.5*  HCT 35.3* 34.4*  PLT 197 198   Recent Labs    06/28/17 0441 06/29/17 0355  NA 137 138  K 4.3 4.2  CL 103 99*  CO2 30 32  BUN 9 11  CREATININE 0.57 0.61  GLUCOSE 141* 101*  CALCIUM 8.2* 8.5*   Lab Results  Component Value Date   INR 1.08 06/16/2017    Recent Radiographic Studies :  Nm Myocar Multi W/spect W/wall Motion / Ef  Result Date: 06/16/2017  There was no ST segment deviation noted during stress.  The study is normal. There are no perfusion defects.  This is a low risk study.  The left ventricular ejection fraction is normal (55-65%).       Examination:  General appearance: alert, cooperative and no distress  Wound Exam: clean, dry, intact   Drainage:  None: wound tissue dry  Motor Exam: EHL, FHL, Anterior Tibial and Posterior Tibial Intact  Sensory Exam: Superficial Peroneal, Deep Peroneal and Tibial normal  Vascular Exam: Normal  Assessment:    2 Days Post-Op  Procedure(s) (LRB): RIGHT TOTAL KNEE ARTHROPLASTY (Right)  ADDITIONAL DIAGNOSIS:  Active Problems:   Primary osteoarthritis of right knee   Total knee replacement status, right  Acute Blood Loss Anemia -stable, asymptomatic  Plan: Physical Therapy as ordered Partial Weight Bearing @ 50% (PWB)  DVT Prophylaxis:  Xarelto, Foot Pumps and TED hose  DISCHARGE PLAN: Home  DISCHARGE NEEDS: HHPT, CPM, Walker  and 3-in-1 comode seat Dressing changed-right knee wound clean and dry.will discharge today       Nonie Hoyer [place]  06/29/2017 8:20 AM  Patient ID: Joanne Kramer, female   DOB: 25-Oct-1945, 72 y.o.   MRN: [id number]

## 2017-06-30 LAB — BASIC METABOLIC PANEL
ANION GAP: 10 (ref 5–15)
BUN: 8 mg/dL (ref 6–20)
CHLORIDE: 95 mmol/L — AB (ref 101–111)
CO2: 33 mmol/L — ABNORMAL HIGH (ref 22–32)
CREATININE: 0.53 mg/dL (ref 0.44–1.00)
Calcium: 8.8 mg/dL — ABNORMAL LOW (ref 8.9–10.3)
GFR calc non Af Amer: >60 mL/min (ref >60)
Glucose, Bld: 92 mg/dL (ref 65–99)
POTASSIUM: 4 mmol/L (ref 3.5–5.1)
SODIUM: 138 mmol/L (ref 135–145)

## 2017-06-30 LAB — CBC
HCT: 33.7 % — ABNORMAL LOW (ref 36.0–46.0)
HEMOGLOBIN: 10.2 g/dL — AB (ref 12.0–15.0)
MCH: 29.4 pg (ref 26.0–34.0)
MCHC: 30.3 g/dL (ref 30.0–36.0)
MCV: 97.1 fL (ref 78.0–100.0)
Platelets: 192 10*3/uL (ref 150–400)
RBC: 3.47 MIL/uL — AB (ref 3.87–5.11)
RDW: 14.7 % (ref 11.5–15.5)
WBC: 10.5 10*3/uL (ref 4.0–10.5)

## 2017-06-30 NOTE — Progress Notes (Signed)
Occupational Therapy Treatment Patient Details Name: Joanne Kramer MRN: [id number] DOB: 03/03/1945 Today's Date: 06/30/2017    History of present illness Pt is a 72 y/o female who presents s/p R TKA on 06/27/17. Pt is currently 50% PWB on the operative side. PMH significant for emphysema, COPD, cataracts.   OT comments  Pt demonstrates decr oxygen saturations with transfer to toilet and sink level care. Pt with decr awareness to decr oxygen saturation. Pt requires 2 L O2 this session to rebound. RT arriving at the end of session. Pt demonstrates LOB x2 during session with self correction but education on sitting for adls due to fall risk.   RN made aware of oxygen levels and fall risk during this session   Follow Up Recommendations  Follow surgeon's recommendation for DC plan and follow-up therapies    Equipment Recommendations  3 in 1 bedside commode    Recommendations for Other Services      Precautions / Restrictions Precautions Precautions: Knee Precaution Comments: educated on adls s/p knee surg Restrictions Weight Bearing Restrictions: Yes RLE Weight Bearing: Partial weight bearing RLE Partial Weight Bearing Percentage or Pounds: 50%(additional cues)       Mobility Bed Mobility Overal bed mobility: Needs Assistance Bed Mobility: Supine to Sit     Supine to sit: Supervision     General bed mobility comments: pt educated on leg lift with belt and able to progress to EOB without (A)   Transfers Overall transfer level: Needs assistance Equipment used: Rolling walker (2 wheeled) Transfers: Sit to/from Stand Sit to Stand: Supervision         General transfer comment: vc for safety    Balance Overall balance assessment: Needs assistance Sitting-balance support: Feet supported Sitting balance-Leahy Scale: Good     Standing balance support: Single extremity supported;During functional activity Standing balance-Leahy Scale: Poor Standing balance comment:  reliant on UE support with static standing                           ADL either performed or assessed with clinical judgement   ADL Overall ADL's : Needs assistance/impaired Eating/Feeding: Independent   Grooming: Supervision/safety;Standing Grooming Details (indicate cue type and reason): pt with lob x1 requiring L UE support on sink surface Upper Body Bathing: Supervision/ safety;Sitting   Lower Body Bathing: Supervison/ safety;Sit to/from stand Lower Body Bathing Details (indicate cue type and reason): pt advised to sit for all task due to fall risk with no UE support       Lower Body Dressing Details (indicate cue type and reason): pt able to reach feet with hip flexion Toilet Transfer: Supervision/safety;BSC;RW Toilet Transfer Details (indicate cue type and reason): pt requires the use of bil UE  Toileting- Clothing Manipulation and Hygiene: Supervision/safety   Tub/ Shower Transfer: Supervision/safety;Walk-in shower;Rolling walker Tub/Shower Transfer Details (indicate cue type and reason): pt has shower seat at home and advised to sit for showering due to LOB x2 during session Functional mobility during ADLs: Supervision/safety;Rolling walker General ADL Comments: pt demonstrates LOB x2 during session with self correction. pt LOB with turnign with RW and LOB with static standing at sink attempting washing face   Educated patient , educated tub/shower transfer,, educated on use of a belt or sheet as a leg lifter  g  Vision   Additional Comments: wears glasses for reading only. present in the room   Perception     Praxis      Cognition Arousal/Alertness: Awake/alert  Behavior During Therapy: Flat affect Overall Cognitive Status: Within Functional Limits for tasks assessed                                          Exercises     Shoulder Instructions       General Comments decreased oxygen saturation to 75% on R hand. RT arriving with  patient on 2L and pt reboudning 88-90 on 2L. See RT notes for details. Pt demonstrates 3 out 4 DOE but self reports 1 out 4 . pt lacks awareness to desaturation. dressing dry and intact. ted hose rolled down slightly and OT repositioning    Pertinent Vitals/ Pain       Pain Assessment: 0-10 Faces Pain Scale: Hurts little more Pain Location: R knee Pain Descriptors / Indicators: Operative site guarding Pain Intervention(s): Monitored during session;Repositioned;RN gave pain meds during session  Home Living                                          Prior Functioning/Environment              Frequency  Min 2X/week        Progress Toward Goals  OT Goals(current goals can now be found in the care plan section)  Progress towards OT goals: Progressing toward goals  Acute Rehab OT Goals Patient Stated Goal: Pain control OT Goal Formulation: With patient Time For Goal Achievement: 07/06/17 Potential to Achieve Goals: Good ADL Goals Pt Will Perform Lower Body Bathing: with modified independence;sit to/from stand Pt Will Perform Lower Body Dressing: with modified independence;sit to/from stand Pt Will Transfer to Toilet: with modified independence;ambulating Pt Will Perform Toileting - Clothing Manipulation and hygiene: with modified independence;sit to/from stand Pt Will Perform Tub/Shower Transfer: Shower transfer;with supervision;ambulating;3 in 1;rolling walker Additional ADL Goal #1: Pt will be mod I with bed mobility to prepare for OOB ADLs.  Plan Discharge plan remains appropriate    Co-evaluation                 AM-PAC PT "6 Clicks" Daily Activity     Outcome Measure   Help from another person eating meals?: None Help from another person taking care of personal grooming?: None Help from another person toileting, which includes using toliet, bedpan, or urinal?: A Little Help from another person bathing (including washing, rinsing, drying)?: A  Little Help from another person to put on and taking off regular upper body clothing?: None Help from another person to put on and taking off regular lower body clothing?: A Little 6 Click Score: 21    End of Session Equipment Utilized During Treatment: Rolling walker;Oxygen  OT Visit Diagnosis: Unsteadiness on feet (R26.81);Pain Pain - Right/Left: Right Pain - part of body: Leg   Activity Tolerance Patient tolerated treatment well   Patient Left in chair;with call bell/phone within reach   Nurse Communication Mobility status;Precautions;Weight bearing status        Time: 3716-9678 OT Time Calculation (min): 29 min  Charges: OT General Charges $OT Visit: 1 Visit OT Treatments $Self Care/Home Management : 23-37 mins   Jeri Modena   OTR/L Pager: [phone] Office: [phone] .    Parke Poisson B 06/30/2017, 9:13 AM

## 2017-06-30 NOTE — Plan of Care (Signed)
  Problem: Health Behavior/Discharge Planning: Goal: Ability to manage health-related needs will improve 06/30/2017 1439 by Rance Muir, RN Outcome: Adequate for Discharge 06/30/2017 1010 by Rance Muir, RN Outcome: Progressing

## 2017-06-30 NOTE — Progress Notes (Signed)
Patient working with OT and became SOB. PRN treatment given. Vitals are stable and sats are 98%. RT will continue to monitor.

## 2017-06-30 NOTE — Progress Notes (Signed)
Physical Therapy Treatment Patient Details Name: Joanne Kramer MRN: [id number] DOB: [birth year] Today's Date: 06/30/2017    History of Present Illness Pt is a 72 y/o female who presents s/p R TKA on 06/27/17. Pt is currently 50% PWB on the operative side. PMH significant for emphysema, COPD, cataracts.    PT Comments    Pt progressing towards physical therapy goals. She was able to progress to the stairs this session and completed 4 steps with mod assist to maintain 50% WB status on the RLE. Pt continues to move slowly and require cues for pursed-lip breathing, sequencing with the RW, and general safety. On RA, sats at 81% after stair training. With 2L/min supplemental O2, pt mostly in mid 90's (~96%) throughout gait training. Will continue to follow and progress as able per POC.    Follow Up Recommendations  Follow surgeon's recommendation for DC plan and follow-up therapies     Equipment Recommendations  Rolling walker with 5" wheels    Recommendations for Other Services       Precautions / Restrictions Precautions Precautions: Knee Precaution Comments: Pt was educated on NO pillow, roll, ice pack, etc under knee, and to rest with knee in extension.  Restrictions Weight Bearing Restrictions: Yes RLE Weight Bearing: Partial weight bearing RLE Partial Weight Bearing Percentage or Pounds: 50%    Mobility  Bed Mobility Overal bed mobility: Needs Assistance Bed Mobility: Sit to Supine;Supine to Sit     Supine to sit: Supervision Sit to supine: Supervision   General bed mobility comments: Pt was able to negotiate to/from EOB without assist from therapist. Using hands to advance RLE.   Transfers Overall transfer level: Needs assistance Equipment used: Rolling walker (2 wheeled) Transfers: Sit to/from Stand Sit to Stand: Supervision         General transfer comment: Pt demonstrated proper hand placement on seated surface for safety. Pt was able to power-up to full stand  with increased time and effort, however no assistance required.   Ambulation/Gait Ambulation/Gait assistance: Min guard Ambulation Distance (Feet): 200 Feet Assistive device: Rolling walker (2 wheeled) Gait Pattern/deviations: Step-to pattern;Decreased stride length;Trunk flexed;Step-through pattern Gait velocity: Decreased Gait velocity interpretation: <1.8 ft/sec, indicate of risk for recurrent falls General Gait Details: Slow and guarded. Several standing rest breaks required due to pain. Pt was cued for increased heel strike, more even step/stride length, and closer proximity to walker.    Stairs Stairs: Yes Stairs assistance: Mod assist Stair Management: One rail Left;Step to pattern;Forwards Number of Stairs: 4 General stair comments: VC's for sequencing and safety on the stairs. Pt required mod assist through HHA on the R to maintain 50% NWB status.   Wheelchair Mobility    Modified Rankin (Stroke Patients Only)       Balance Overall balance assessment: Needs assistance Sitting-balance support: Feet supported Sitting balance-Leahy Scale: Good     Standing balance support: Single extremity supported;During functional activity Standing balance-Leahy Scale: Poor Standing balance comment: reliant on UE support with static standing                            Cognition Arousal/Alertness: Awake/alert Behavior During Therapy: Flat affect Overall Cognitive Status: Within Functional Limits for tasks assessed                                        Exercises Total Joint  Exercises Heel Slides: 10 reps Goniometric ROM: 58 AAROM in sitting.  Other Exercises Other Exercises: HEP reviewed verbally.     General Comments        Pertinent Vitals/Pain Pain Assessment: 0-10 Pain Score: 4  Faces Pain Scale: Hurts whole lot(Pt reports 4/10 but faces pain scale is 8/10) Pain Location: R knee Pain Descriptors / Indicators: Operative site  guarding Pain Intervention(s): Monitored during session    Home Living                      Prior Function            PT Goals (current goals can now be found in the care plan section) Acute Rehab PT Goals Patient Stated Goal: Pain control PT Goal Formulation: With patient Time For Goal Achievement: 07/05/17 Potential to Achieve Goals: Good Progress towards PT goals: Progressing toward goals    Frequency    7X/week      PT Plan Current plan remains appropriate    Co-evaluation              AM-PAC PT "6 Clicks" Daily Activity  Outcome Measure  Difficulty turning over in bed (including adjusting bedclothes, sheets and blankets)?: A Little Difficulty moving from lying on back to sitting on the side of the bed? : A Little Difficulty sitting down on and standing up from a chair with arms (e.g., wheelchair, bedside commode, etc,.)?: A Little Help needed moving to and from a bed to chair (including a wheelchair)?: A Little Help needed walking in hospital room?: A Little Help needed climbing 3-5 steps with a railing? : A Little 6 Click Score: 18    End of Session Equipment Utilized During Treatment: Gait belt Activity Tolerance: Patient tolerated treatment well Patient left: in chair;with call bell/phone within reach Nurse Communication: Mobility status PT Visit Diagnosis: Pain;Unsteadiness on feet (R26.81);Other abnormalities of gait and mobility (R26.89) Pain - Right/Left: Right Pain - part of body: Knee     Time: 2703-5009 PT Time Calculation (min) (ACUTE ONLY): 56 min  Charges:  $Gait Training: 53-67 mins                    G Codes:       Rolinda Roan, PT, DPT Acute Rehabilitation Services Pager: [phone] 06/30/2017, 3:22 PM

## 2017-06-30 NOTE — Plan of Care (Signed)
  Problem: Health Behavior/Discharge Planning: Goal: Ability to manage health-related needs will improve Outcome: Progressing   

## 2017-06-30 NOTE — Progress Notes (Signed)
Physical Therapy Treatment Patient Details Name: Joanne Kramer MRN: [id number] DOB: 1945/08/05 Today's Date: 06/30/2017    History of Present Illness Pt is a 72 y/o female who presents s/p R TKA on 06/27/17. Pt is currently 50% PWB on the operative side. PMH significant for emphysema, COPD, cataracts.    PT Comments    Pt progressing slowly towards physical therapy goals. Continues to be limited by pain, especially during therapeutic exercise. Will need to demonstrate safe stair negotiation prior to d/c home. Plan to initiate this afternoon during second PT session. Will continue to follow.   SATURATION QUALIFICATIONS: (This note is used to comply with regulatory documentation for home oxygen)  Patient Saturations on Room Air at Rest = 89-92%  Patient Saturations on Room Air while Ambulating = 84%  Patient Saturations on 2 Liters of oxygen while Ambulating = 94-99%  Please briefly explain why patient needs home oxygen: Pt is currently unable to maintain O2 sats >90% on RA during ambulation. As pt fatigues with mobility O2 sats decline even with supplemental O2 donned.    Follow Up Recommendations  Follow surgeon's recommendation for DC plan and follow-up therapies     Equipment Recommendations  Rolling walker with 5" wheels    Recommendations for Other Services       Precautions / Restrictions Precautions Precautions: Knee Precaution Comments: Pt was educated on NO pillow, roll, ice pack, etc under knee, and to rest with knee in extension.  Restrictions Weight Bearing Restrictions: Yes RLE Weight Bearing: Partial weight bearing RLE Partial Weight Bearing Percentage or Pounds: 50%    Mobility  Bed Mobility               General bed mobility comments: Pt sitting up in recliner upon PT arrival.   Transfers Overall transfer level: Needs assistance Equipment used: Rolling walker (2 wheeled) Transfers: Sit to/from Stand Sit to Stand: Supervision         General  transfer comment: Pt demonstrated proper hand placement on seated surface for safety. Pt was able to power-up to full stand with increased time and effort, however no assistance required.   Ambulation/Gait Ambulation/Gait assistance: Min guard Ambulation Distance (Feet): 120 Feet Assistive device: Rolling walker (2 wheeled) Gait Pattern/deviations: Step-to pattern;Decreased stride length;Trunk flexed Gait velocity: Decreased Gait velocity interpretation: <1.8 ft/sec, indicate of risk for recurrent falls General Gait Details: Slow and extremely guarded. Several standing rest breaks required due to pain. Pt was cued for increased heel strike, more even step/stride length, and closer proximity to walker.    Stairs             Wheelchair Mobility    Modified Rankin (Stroke Patients Only)       Balance Overall balance assessment: Needs assistance Sitting-balance support: Feet supported Sitting balance-Leahy Scale: Good     Standing balance support: Single extremity supported;During functional activity Standing balance-Leahy Scale: Poor Standing balance comment: reliant on UE support with static standing                            Cognition Arousal/Alertness: Awake/alert Behavior During Therapy: Flat affect Overall Cognitive Status: Within Functional Limits for tasks assessed                                        Exercises Total Joint Exercises Heel Slides: 10 reps Goniometric ROM: 58  AAROM in sitting.  Other Exercises Other Exercises: HEP reviewed verbally. Pt was too painful to tolerate therapeutic exercise at end of session.     General Comments        Pertinent Vitals/Pain Pain Assessment: 0-10 Pain Score: 4  Faces Pain Scale: Hurts whole lot(Pt reports 4/10 but faces pain scale is 8/10) Pain Location: R knee Pain Descriptors / Indicators: Operative site guarding Pain Intervention(s): Monitored during session    Home Living                       Prior Function            PT Goals (current goals can now be found in the care plan section) Acute Rehab PT Goals Patient Stated Goal: Pain control PT Goal Formulation: With patient/family Time For Goal Achievement: 07/05/17 Potential to Achieve Goals: Good Progress towards PT goals: Progressing toward goals    Frequency    7X/week      PT Plan Current plan remains appropriate    Co-evaluation              AM-PAC PT "6 Clicks" Daily Activity  Outcome Measure  Difficulty turning over in bed (including adjusting bedclothes, sheets and blankets)?: A Little Difficulty moving from lying on back to sitting on the side of the bed? : A Little Difficulty sitting down on and standing up from a chair with arms (e.g., wheelchair, bedside commode, etc,.)?: A Little Help needed moving to and from a bed to chair (including a wheelchair)?: A Little Help needed walking in hospital room?: A Little Help needed climbing 3-5 steps with a railing? : A Lot 6 Click Score: 17    End of Session Equipment Utilized During Treatment: Gait belt Activity Tolerance: Patient tolerated treatment well Patient left: in chair;with call bell/phone within reach Nurse Communication: Mobility status PT Visit Diagnosis: Pain;Unsteadiness on feet (R26.81);Other abnormalities of gait and mobility (R26.89) Pain - Right/Left: Right Pain - part of body: Knee     Time: 1003-1047 PT Time Calculation (min) (ACUTE ONLY): 44 min  Charges:  $Gait Training: 38-52 mins                    G Codes:       Rolinda Roan, PT, DPT Acute Rehabilitation Services Pager: [place] 06/30/2017, 1:52 PM

## 2017-07-01 DIAGNOSIS — Z9981 Dependence on supplemental oxygen: Secondary | ICD-10-CM | POA: Diagnosis not present

## 2017-07-01 DIAGNOSIS — Z7951 Long term (current) use of inhaled steroids: Secondary | ICD-10-CM | POA: Diagnosis not present

## 2017-07-01 DIAGNOSIS — R51 Headache: Secondary | ICD-10-CM | POA: Diagnosis not present

## 2017-07-01 DIAGNOSIS — J449 Chronic obstructive pulmonary disease, unspecified: Secondary | ICD-10-CM | POA: Diagnosis not present

## 2017-07-01 DIAGNOSIS — Z79891 Long term (current) use of opiate analgesic: Secondary | ICD-10-CM | POA: Diagnosis not present

## 2017-07-01 DIAGNOSIS — E7800 Pure hypercholesterolemia, unspecified: Secondary | ICD-10-CM | POA: Diagnosis not present

## 2017-07-01 DIAGNOSIS — Z471 Aftercare following joint replacement surgery: Secondary | ICD-10-CM | POA: Diagnosis not present

## 2017-07-01 DIAGNOSIS — Z96651 Presence of right artificial knee joint: Secondary | ICD-10-CM | POA: Diagnosis not present

## 2017-07-03 ENCOUNTER — Telehealth (INDEPENDENT_AMBULATORY_CARE_PROVIDER_SITE_OTHER): Payer: Self-pay | Admitting: Orthopaedic Surgery

## 2017-07-03 DIAGNOSIS — Z79891 Long term (current) use of opiate analgesic: Secondary | ICD-10-CM | POA: Diagnosis not present

## 2017-07-03 DIAGNOSIS — Z96651 Presence of right artificial knee joint: Secondary | ICD-10-CM | POA: Diagnosis not present

## 2017-07-03 DIAGNOSIS — E7800 Pure hypercholesterolemia, unspecified: Secondary | ICD-10-CM | POA: Diagnosis not present

## 2017-07-03 DIAGNOSIS — Z7951 Long term (current) use of inhaled steroids: Secondary | ICD-10-CM | POA: Diagnosis not present

## 2017-07-03 DIAGNOSIS — Z9981 Dependence on supplemental oxygen: Secondary | ICD-10-CM | POA: Diagnosis not present

## 2017-07-03 DIAGNOSIS — Z471 Aftercare following joint replacement surgery: Secondary | ICD-10-CM | POA: Diagnosis not present

## 2017-07-03 DIAGNOSIS — R51 Headache: Secondary | ICD-10-CM | POA: Diagnosis not present

## 2017-07-03 DIAGNOSIS — J449 Chronic obstructive pulmonary disease, unspecified: Secondary | ICD-10-CM | POA: Diagnosis not present

## 2017-07-03 NOTE — Telephone Encounter (Signed)
Debbie from [facility] called requesting verification of Physical Therapy for 1 time/week for 1 week and then 3 times/week for 2 weeks.  Jackelyn Poling states that patient uses Oxygen at night, but needed to utilize it during the day.  Patient was told to follow-up with her PCP physician.  Please call Debbie at [phone]

## 2017-07-03 NOTE — Telephone Encounter (Signed)
Left message to ok PT per Dr. Durward Fortes

## 2017-07-03 NOTE — Telephone Encounter (Signed)
Ok for PT as outlined

## 2017-07-03 NOTE — Telephone Encounter (Signed)
Please advise 

## 2017-07-05 DIAGNOSIS — Z9981 Dependence on supplemental oxygen: Secondary | ICD-10-CM | POA: Diagnosis not present

## 2017-07-05 DIAGNOSIS — J449 Chronic obstructive pulmonary disease, unspecified: Secondary | ICD-10-CM | POA: Diagnosis not present

## 2017-07-05 DIAGNOSIS — Z79891 Long term (current) use of opiate analgesic: Secondary | ICD-10-CM | POA: Diagnosis not present

## 2017-07-05 DIAGNOSIS — Z7951 Long term (current) use of inhaled steroids: Secondary | ICD-10-CM | POA: Diagnosis not present

## 2017-07-05 DIAGNOSIS — R51 Headache: Secondary | ICD-10-CM | POA: Diagnosis not present

## 2017-07-05 DIAGNOSIS — Z96651 Presence of right artificial knee joint: Secondary | ICD-10-CM | POA: Diagnosis not present

## 2017-07-05 DIAGNOSIS — Z471 Aftercare following joint replacement surgery: Secondary | ICD-10-CM | POA: Diagnosis not present

## 2017-07-05 DIAGNOSIS — E7800 Pure hypercholesterolemia, unspecified: Secondary | ICD-10-CM | POA: Diagnosis not present

## 2017-07-07 DIAGNOSIS — Z471 Aftercare following joint replacement surgery: Secondary | ICD-10-CM | POA: Diagnosis not present

## 2017-07-07 DIAGNOSIS — R51 Headache: Secondary | ICD-10-CM | POA: Diagnosis not present

## 2017-07-07 DIAGNOSIS — Z9981 Dependence on supplemental oxygen: Secondary | ICD-10-CM | POA: Diagnosis not present

## 2017-07-07 DIAGNOSIS — Z79891 Long term (current) use of opiate analgesic: Secondary | ICD-10-CM | POA: Diagnosis not present

## 2017-07-07 DIAGNOSIS — J449 Chronic obstructive pulmonary disease, unspecified: Secondary | ICD-10-CM | POA: Diagnosis not present

## 2017-07-07 DIAGNOSIS — Z96651 Presence of right artificial knee joint: Secondary | ICD-10-CM | POA: Diagnosis not present

## 2017-07-07 DIAGNOSIS — Z7951 Long term (current) use of inhaled steroids: Secondary | ICD-10-CM | POA: Diagnosis not present

## 2017-07-07 DIAGNOSIS — E7800 Pure hypercholesterolemia, unspecified: Secondary | ICD-10-CM | POA: Diagnosis not present

## 2017-07-10 DIAGNOSIS — Z9981 Dependence on supplemental oxygen: Secondary | ICD-10-CM | POA: Diagnosis not present

## 2017-07-10 DIAGNOSIS — R51 Headache: Secondary | ICD-10-CM | POA: Diagnosis not present

## 2017-07-10 DIAGNOSIS — E7800 Pure hypercholesterolemia, unspecified: Secondary | ICD-10-CM | POA: Diagnosis not present

## 2017-07-10 DIAGNOSIS — J449 Chronic obstructive pulmonary disease, unspecified: Secondary | ICD-10-CM | POA: Diagnosis not present

## 2017-07-10 DIAGNOSIS — Z7951 Long term (current) use of inhaled steroids: Secondary | ICD-10-CM | POA: Diagnosis not present

## 2017-07-10 DIAGNOSIS — Z96651 Presence of right artificial knee joint: Secondary | ICD-10-CM | POA: Diagnosis not present

## 2017-07-10 DIAGNOSIS — Z471 Aftercare following joint replacement surgery: Secondary | ICD-10-CM | POA: Diagnosis not present

## 2017-07-10 DIAGNOSIS — Z79891 Long term (current) use of opiate analgesic: Secondary | ICD-10-CM | POA: Diagnosis not present

## 2017-07-11 DIAGNOSIS — Z471 Aftercare following joint replacement surgery: Secondary | ICD-10-CM | POA: Diagnosis not present

## 2017-07-11 DIAGNOSIS — R51 Headache: Secondary | ICD-10-CM | POA: Diagnosis not present

## 2017-07-11 DIAGNOSIS — Z79891 Long term (current) use of opiate analgesic: Secondary | ICD-10-CM | POA: Diagnosis not present

## 2017-07-11 DIAGNOSIS — E7800 Pure hypercholesterolemia, unspecified: Secondary | ICD-10-CM | POA: Diagnosis not present

## 2017-07-11 DIAGNOSIS — Z9981 Dependence on supplemental oxygen: Secondary | ICD-10-CM | POA: Diagnosis not present

## 2017-07-11 DIAGNOSIS — J449 Chronic obstructive pulmonary disease, unspecified: Secondary | ICD-10-CM | POA: Diagnosis not present

## 2017-07-11 DIAGNOSIS — Z7951 Long term (current) use of inhaled steroids: Secondary | ICD-10-CM | POA: Diagnosis not present

## 2017-07-11 DIAGNOSIS — Z96651 Presence of right artificial knee joint: Secondary | ICD-10-CM | POA: Diagnosis not present

## 2017-07-12 ENCOUNTER — Ambulatory Visit (INDEPENDENT_AMBULATORY_CARE_PROVIDER_SITE_OTHER): Payer: Medicare Other

## 2017-07-12 ENCOUNTER — Ambulatory Visit (INDEPENDENT_AMBULATORY_CARE_PROVIDER_SITE_OTHER): Payer: Medicare Other | Admitting: Orthopaedic Surgery

## 2017-07-12 ENCOUNTER — Encounter (INDEPENDENT_AMBULATORY_CARE_PROVIDER_SITE_OTHER): Payer: Self-pay | Admitting: Orthopaedic Surgery

## 2017-07-12 VITALS — BP 108/68 | HR 96 | Resp 18 | Ht 66.0 in | Wt 180.0 lb

## 2017-07-12 DIAGNOSIS — M25561 Pain in right knee: Secondary | ICD-10-CM

## 2017-07-12 DIAGNOSIS — Z96651 Presence of right artificial knee joint: Secondary | ICD-10-CM

## 2017-07-12 HISTORY — DX: Presence of right artificial knee joint: Z96.651

## 2017-07-12 NOTE — Progress Notes (Signed)
Office Visit Note   Patient: Joanne Kramer           Date of Birth: Mar 04, 1945           MRN: [id number] Visit Date: 07/12/2017              Requested by: Terald Sleeper, PA-C [place], [place] [address] PCP: Terald Sleeper, PA-C   Assessment & Plan: Visit Diagnoses:  1. Acute pain of right knee   2. History of total right knee replacement     Plan: 2 weeks status post primary right total knee replacement.  Doing well.  Receiving home health physical therapy.  Independent with a walker.  Remove staples from right knee and apply Steri-Strips.  Start outpatient therapy next week.  Renew oxycodone.  Progress to a cane with weightbearing as tolerated.  Office 2 weeks  Follow-Up Instructions: Return in about 2 weeks (around 07/26/2017).   Orders:  Orders Placed This Encounter  Procedures  . XR KNEE 3 VIEW RIGHT   No orders of the defined types were placed in this encounter.     Procedures: No procedures performed   Clinical Data: No additional findings.   Subjective: Chief Complaint  Patient presents with  . Right Knee - Pain  . Post-op Follow-up    r tkr 06/28/17, having some pain, weakness and numbness. using walker has developed a lump on right side of chest  2 weeks status post primary right total knee replacement.  Doing well with her home therapy.  Dependent with a walker.  No related fever or chills.  No calf pain.  Has been using less Oxy IR  HPI  Review of Systems  Constitutional: Negative for fatigue and fever.  HENT: Negative for ear pain.   Eyes: Negative for pain.  Respiratory: Negative for cough and shortness of breath.   Cardiovascular: Positive for leg swelling.  Gastrointestinal: Negative for constipation and diarrhea.  Genitourinary: Negative for difficulty urinating.  Musculoskeletal: Negative for back pain and neck pain.  Skin: Negative for rash.  Allergic/Immunologic: Negative for food allergies.  Neurological: Positive for weakness  and numbness.  Hematological: Bruises/bleeds easily.  Psychiatric/Behavioral: Positive for sleep disturbance.     Objective: Vital Signs: BP 108/68 (BP Location: Right Arm, Patient Position: Sitting, Cuff Size: Normal)   Pulse 96   Resp 18   Ht 5\' 6"  (1.676 m)   Wt 180 lb (81.6 kg)   BMI 29.05 kg/m   Physical Exam  Ortho Exam awake alert and oriented x3 comfortable sitting.  No shortness of breath or chest pain.  Right knee exam without evidence of infection.  Clips were removed and Steri-Strips applied.  Has full extension and approximately 90 degrees of flexion.  No instability.  No calf pain.  No swelling distally.  Vascular exam intact  Specialty Comments:  No specialty comments available.  Imaging: No results found.   PMFS History: Patient Active Problem List   Diagnosis Date Noted  . 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 Total knee replacement status, right 06/27/2017  . Primary osteoarthritis of right knee 02/15/2017  . Primary osteoarthritis of left knee 02/15/2017  . 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 Nonintractable episodic headache 02/15/2017  . Chronic obstructive pulmonary disease ([place]) 11/08/2016  . Pure hypercholesterolemia 11/08/2016  . Hypoxia 11/08/2016  . Tobacco abuse 11/08/2016   Past Medical History:  Diagnosis Date  . Cataract   . COPD (chronic obstructive  pulmonary disease) ([place])   . Emphysema of lung ([place])   . Headache   . OA (osteoarthritis) of knee    right  . Pneumonia   . Requires supplemental oxygen     Family History  Adopted: Yes    Past Surgical History:  Procedure Laterality Date  . BREAST SURGERY     left breast aspiration  . CESAREAN SECTION    . COLONOSCOPY    . EYE SURGERY    . KNEE SURGERY Right   . TONSILLECTOMY    . TOTAL KNEE ARTHROPLASTY Right 06/27/2017   Procedure: RIGHT TOTAL KNEE ARTHROPLASTY;  Surgeon: Garald Balding, MD;  Location: [facility];  Service: Orthopedics;   Laterality: Right;   Social History   Occupational History  . Not on file  Tobacco Use  . Smoking status: Current Some Day Smoker    Packs/day: 1.00    Years: 55.00    Pack years: 55.00    Types: Cigarettes    Last attempt to quit: 05/07/2017    Years since quitting: 0.1  . Smokeless tobacco: Never Used  Substance and Sexual Activity  . Alcohol use: No  . Drug use: No  . Sexual activity: Not Currently

## 2017-07-13 DIAGNOSIS — Z471 Aftercare following joint replacement surgery: Secondary | ICD-10-CM | POA: Diagnosis not present

## 2017-07-13 DIAGNOSIS — R51 Headache: Secondary | ICD-10-CM | POA: Diagnosis not present

## 2017-07-13 DIAGNOSIS — J449 Chronic obstructive pulmonary disease, unspecified: Secondary | ICD-10-CM | POA: Diagnosis not present

## 2017-07-13 DIAGNOSIS — E7800 Pure hypercholesterolemia, unspecified: Secondary | ICD-10-CM | POA: Diagnosis not present

## 2017-07-13 DIAGNOSIS — Z96651 Presence of right artificial knee joint: Secondary | ICD-10-CM | POA: Diagnosis not present

## 2017-07-13 DIAGNOSIS — Z9981 Dependence on supplemental oxygen: Secondary | ICD-10-CM | POA: Diagnosis not present

## 2017-07-13 DIAGNOSIS — Z79891 Long term (current) use of opiate analgesic: Secondary | ICD-10-CM | POA: Diagnosis not present

## 2017-07-13 DIAGNOSIS — Z7951 Long term (current) use of inhaled steroids: Secondary | ICD-10-CM | POA: Diagnosis not present

## 2017-07-13 NOTE — Addendum Note (Signed)
Addended by: Deeann Dowse R on: 07/13/2017 08:55 AM   Modules accepted: Orders

## 2017-07-14 ENCOUNTER — Telehealth (INDEPENDENT_AMBULATORY_CARE_PROVIDER_SITE_OTHER): Payer: Self-pay | Admitting: Orthopaedic Surgery

## 2017-07-14 NOTE — Telephone Encounter (Signed)
Patient calling regarding continuous passive knee exerciser. Patient would like to know if she needs to continue using that, and how long.

## 2017-07-14 NOTE — Telephone Encounter (Signed)
notified pt she could discontinue the CPM machine

## 2017-07-14 NOTE — Telephone Encounter (Signed)
Can discontinue CPM machine

## 2017-07-14 NOTE — Telephone Encounter (Signed)
PLEASE ADVISE.

## 2017-07-17 ENCOUNTER — Encounter: Payer: Self-pay | Admitting: Physical Therapy

## 2017-07-17 ENCOUNTER — Ambulatory Visit: Payer: Medicare Other | Attending: Orthopaedic Surgery | Admitting: Physical Therapy

## 2017-07-17 ENCOUNTER — Other Ambulatory Visit: Payer: Self-pay

## 2017-07-17 DIAGNOSIS — M25561 Pain in right knee: Secondary | ICD-10-CM | POA: Insufficient documentation

## 2017-07-17 DIAGNOSIS — M25661 Stiffness of right knee, not elsewhere classified: Secondary | ICD-10-CM

## 2017-07-17 DIAGNOSIS — R262 Difficulty in walking, not elsewhere classified: Secondary | ICD-10-CM | POA: Insufficient documentation

## 2017-07-17 DIAGNOSIS — G8929 Other chronic pain: Secondary | ICD-10-CM | POA: Insufficient documentation

## 2017-07-17 NOTE — Therapy (Signed)
[facility] [place], [place], [address] Phone: [phone]   Fax:  [phone]  Physical Therapy Evaluation  Patient Details  Name: Joanne Kramer MRN: [id number] Date of Birth: 1972-03-08 Referring Provider: Joni Fears   Encounter Date: 07/17/2017  PT End of Session - 07/17/17 1635    Visit Number  1    Number of Visits  16    Date for PT Re-Evaluation  10/15/17    Authorization Type  FOTO every 5th visit, progress note every 10th visit, KX modifier at 15th visit    PT Start Time  1433    PT Stop Time  1519    PT Time Calculation (min)  46 min    Activity Tolerance  Patient tolerated treatment well    Behavior During Therapy  [facility] for tasks assessed/performed       Past Medical History:  Diagnosis Date  . Cataract   . COPD (chronic obstructive pulmonary disease) ([place])   . Emphysema of lung ([place])   . Headache   . OA (osteoarthritis) of knee    right  . Pneumonia   . Requires supplemental oxygen     Past Surgical History:  Procedure Laterality Date  . BREAST SURGERY     left breast aspiration  . CESAREAN SECTION    . COLONOSCOPY    . EYE SURGERY    . KNEE SURGERY Right   . TONSILLECTOMY    . TOTAL KNEE ARTHROPLASTY Right 06/27/2017   Procedure: RIGHT TOTAL KNEE ARTHROPLASTY;  Surgeon: Garald Balding, MD;  Location: [facility];  Service: Orthopedics;  Laterality: Right;    There were no vitals filed for this visit.   Subjective Assessment - 07/17/17 1634    Subjective  Patient arrives to physical therapy with reports of right knee pain and decreased AROM status post R TKA, June 27, 2017. Patient reported having 6 visits with home care after surgery. Patient reports having difficulties with ADLs, walking, negotiating steps, especially descending steps, and getting in and out of the bath. Patient reports her boyfriend has been assisting with activities. Patient ambulates with a rolling walker and was  cleared by surgeon to transition to the least restrictive AD but patient reported she "pulled her groin" and is having difficulty with walking without rolling walker. Patient's current and at best pain is 2-3/10 with the use of ice and heat. Patient's at worst pain is 5/10 with intermittent reports of shooting pain into the right shin. Patient's goals are to decrease pain and return to normal.      Pertinent History  R TKA 06/27/17; COPD    Limitations  House hold activities;Standing;Walking    Currently in Pain?  Yes    Pain Score  3     Pain Location  Knee    Pain Orientation  Right    Pain Descriptors / Indicators  Sore    Pain Type  Surgical pain    Pain Onset  1 to 4 weeks ago    Pain Frequency  Constant    Aggravating Factors   movement    Pain Relieving Factors  ice, heat    Effect of Pain on Daily Activities  difficulty performing ADLs independently         [facility] PT Assessment - 07/17/17 0001      Assessment   Medical Diagnosis  Right TKA    Referring Provider  Joni Fears    Onset Date/Surgical Date  06/27/17  Next MD Visit  07/26/17    Prior Therapy  yes      Balance Screen   Has the patient fallen in the past 6 months  No    Has the patient had a decrease in activity level because of a fear of falling?   Yes    Is the patient reluctant to leave their home because of a fear of falling?   No      Home Environment   Living Environment  Private residence    Living Arrangements  Spouse/significant other    Type of [place] to enter    Entrance Stairs-Number of Steps  5-6    Entrance Stairs-Rails  Can reach both    [place]  One level      Prior Function   Level of Independence  Independent with gait;Needs assistance with ADLs      Observation/Other Assessments   Observations  aquacell dressing applied    Skin Integrity  minimal ecchymosis      Observation/Other Assessments-Edema    Edema  Circumferential 45.0 cm R, 40.6 cm L       Sensation   Light Touch  Impaired by gross assessment decreased sensation to lateral aspect of right knee      ROM / Strength   AROM / PROM / Strength  AROM;Strength;PROM      AROM   Overall AROM   Deficits    AROM Assessment Site  Knee    Right/Left Knee  Right    Right Knee Extension  -5    Right Knee Flexion  80      PROM   Overall PROM   Deficits    PROM Assessment Site  Knee    Right/Left Knee  Right    Right Knee Extension  -4    Right Knee Flexion  90      Strength   Overall Strength  Deficits    Overall Strength Comments  grossly assessed 3/5    Strength Assessment Site  Knee    Right/Left Knee  Right      Ambulation/Gait   Assistive device  Rolling walker    Gait Pattern  Step-to pattern;Step-through pattern;Decreased step length - left;Decreased stance time - left;Decreased stride length;Decreased hip/knee flexion - right;Decreased dorsiflexion - right;Decreased weight shift to right;Right foot flat;Antalgic    Gait Comments  at stance, decreased weight shift to right                Objective measurements completed on examination: See above findings.      [place] Adult PT Treatment/Exercise - 07/17/17 0001      Modalities   Modalities  Vasopneumatic      Vasopneumatic   Number Minutes Vasopneumatic   15 minutes sitting in chair with leg elevated on bolster    Vasopnuematic Location   Knee    Vasopneumatic Pressure  Low    Vasopneumatic Temperature   34               PT Short Term Goals - 07/17/17 2124      PT SHORT TERM GOAL #1   Title  Patient will demonstrate full right knee extension AROM in order to normalize gait pattern.    Time  2    Period  Weeks    Status  New      PT SHORT TERM GOAL #2   Title  Patient will demonstrate 90+ degrees  of right knee flexion AROM to impove ability to perform ADLs and functional activities.    Time  2    Period  Weeks    Status  New        PT Long Term Goals - 07/17/17 2126      PT  LONG TERM GOAL #1   Title  Patient will be independent with advanced HEP.    Time  8    Period  Weeks    Status  New      PT LONG TERM GOAL #2   Title  Patient will demonstrate 115 degreess or greater right knee flexion AROM to improve ability to perform ADLs and functional tasks.    Time  8    Period  Weeks    Status  New      PT LONG TERM GOAL #3   Title  Patient will demonstrate reciprocal step over step stair negotiation utilizing both railings in order to safely enter and exit home.    Time  8    Period  Weeks    Status  New      PT LONG TERM GOAL #4   Title  Patient will ambulate community distances with least restrictive AD and less than 3/10 right knee pain.    Time  8    Period  Weeks    Status  New      PT LONG TERM GOAL #5   Title  Patient will demonstrate 4+/5 or greater R knee MMT in all planes for stability during gait and functional activities.    Time  8    Period  Weeks    Status  New             Plan - 07/17/17 2120    Clinical Impression Statement  Patient is a 72 year old female who present to physical therapy with R knee pain, decreased ROM, and decreased strength s/p R TKA, 06/27/17. Patient's strength grossly assessed. Patient noted with increased R knee edema and decreased lateral knee sensation. Patient ambulates with a rolling walker with an antalgic gait pattern, decreased right knee flexion, decreased right stance time, and decreased stride length. Patient has difficulties with lying supine secondary to COPD. ROM assessed in long sitting with patient's UEs providing trunk support. Vasopneumatic device performed in sitting with right LE elevated on a bolster. Patient would benefit from skilled physical therapy to address deficits and address patient's goals.    History and Personal Factors relevant to plan of care:  R TKA 06/27/17    Clinical Presentation  Stable    Clinical Decision Making  Low    Rehab Potential  Excellent    PT Frequency  2x /  week    PT Duration  8 weeks    PT Treatment/Interventions  ADLs/Self Care Home Management;Moist Heat;Cryotherapy;Occupational psychologist;Therapeutic activities;Functional mobility training;Therapeutic exercise;Balance training;Patient/family education;Neuromuscular re-education;Scar mobilization;Vasopneumatic Device;Taping;Dry needling;Passive range of motion;Manual techniques    PT Next Visit Plan  Nustep, AROM/PROM, modalities PRN for pain relief.    PT Home Exercise Plan  cont exercises provided by home physical therapist    Consulted and Agree with Plan of Care  Patient       Patient will benefit from skilled therapeutic intervention in order to improve the following deficits and impairments:  Pain, Decreased activity tolerance, Decreased range of motion, Decreased strength, Difficulty walking, Increased edema, Decreased balance  Visit Diagnosis: Chronic pain of right knee  Stiffness of right knee,  not elsewhere classified  Difficulty in walking, not elsewhere classified     Problem List Patient Active Problem List   Diagnosis Date Noted  . 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 Total knee replacement status, right 06/27/2017  . Primary osteoarthritis of right knee 02/15/2017  . Primary osteoarthritis of left knee 02/15/2017  . 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 Nonintractable episodic headache 02/15/2017  . Chronic obstructive pulmonary disease ([place]) 11/08/2016  . Pure hypercholesterolemia 11/08/2016  . Hypoxia 11/08/2016  . Tobacco abuse 11/08/2016    Gabriela Eves, PT, DPT 07/17/2017, 9:32 PM   [facility] [address] [place], [place], [address] Phone: [phone]   Fax:  [phone]  Name: Natarsha Hurwitz MRN: [id number] Date of Birth: 23-Apr-1945

## 2017-07-20 ENCOUNTER — Encounter: Payer: Self-pay | Admitting: Physical Therapy

## 2017-07-20 ENCOUNTER — Ambulatory Visit: Payer: Medicare Other | Admitting: Physical Therapy

## 2017-07-20 DIAGNOSIS — G8929 Other chronic pain: Secondary | ICD-10-CM

## 2017-07-20 DIAGNOSIS — M25561 Pain in right knee: Principal | ICD-10-CM

## 2017-07-20 DIAGNOSIS — M25661 Stiffness of right knee, not elsewhere classified: Secondary | ICD-10-CM | POA: Diagnosis not present

## 2017-07-20 DIAGNOSIS — R262 Difficulty in walking, not elsewhere classified: Secondary | ICD-10-CM

## 2017-07-20 NOTE — Therapy (Signed)
[facility] [place], [place], [address] Phone: [phone]   Fax:  [phone]  Physical Therapy Treatment  Patient Details  Name: Joanne Kramer MRN: [id number] Date of Birth: [birth year] Referring Provider: Joni Fears   Encounter Date: 07/20/2017  PT End of Session - 07/20/17 1756    Visit Number  2    Number of Visits  16    Date for PT Re-Evaluation  10/15/17    Authorization Type  FOTO every 5th visit, progress note every 10th visit, KX modifier at 15th visit    PT Start Time  1520    PT Stop Time  1615    PT Time Calculation (min)  55 min    Activity Tolerance  Patient tolerated treatment well    Behavior During Therapy  [facility] for tasks assessed/performed       Past Medical History:  Diagnosis Date  . Cataract   . COPD (chronic obstructive pulmonary disease) ([place])   . Emphysema of lung ([place])   . Headache   . OA (osteoarthritis) of knee    right  . Pneumonia   . Requires supplemental oxygen     Past Surgical History:  Procedure Laterality Date  . BREAST SURGERY     left breast aspiration  . CESAREAN SECTION    . COLONOSCOPY    . EYE SURGERY    . KNEE SURGERY Right   . TONSILLECTOMY    . TOTAL KNEE ARTHROPLASTY Right 06/27/2017   Procedure: RIGHT TOTAL KNEE ARTHROPLASTY;  Surgeon: Garald Balding, MD;  Location: [place];  Service: Orthopedics;  Laterality: Right;    There were no vitals filed for this visit.  Subjective Assessment - 07/20/17 1524    Subjective  Reports her groin is still very sore but states that her knee doesn't hurt upon arrival.     Pertinent History  R TKA 06/27/17; COPD    Limitations  House hold activities;Standing;Walking    Currently in Pain?  No/denies         [facility] PT Assessment - 07/20/17 0001      Assessment   Medical Diagnosis  Right TKA    Onset Date/Surgical Date  06/27/17    Next MD Visit  07/26/17    Prior Therapy  yes                    [facility] Adult PT Treatment/Exercise - 07/20/17 0001      Exercises   Exercises  Knee/Hip      Knee/Hip Exercises: Aerobic   Nustep  L2 x17 min      Knee/Hip Exercises: Standing   Hip Flexion  Other (comment) Attempted but unable due to pain    Forward Lunges  Right;2 sets;10 reps      Modalities   Modalities  Electrical Stimulation;Vasopneumatic      Electrical Stimulation   Electrical Stimulation Location  R knee    Electrical Stimulation Action  IFC    Electrical Stimulation Parameters  1-10 hz x15 min    Electrical Stimulation Goals  Pain;Edema      Vasopneumatic   Number Minutes Vasopneumatic   10 minutes    Vasopnuematic Location   Knee    Vasopneumatic Pressure  Low    Vasopneumatic Temperature   34      Manual Therapy   Manual Therapy  Soft tissue mobilization;Passive ROM    Soft tissue mobilization  STW to L hip adductors and L patella mobilziations  to promote proper mobility    Passive ROM  PROM of R knee into flexion, extension with holds at end range               PT Short Term Goals - 07/17/17 2124      PT SHORT TERM GOAL #1   Title  Patient will demonstrate full right knee extension AROM in order to normalize gait pattern.    Time  2    Period  Weeks    Status  New      PT SHORT TERM GOAL #2   Title  Patient will demonstrate 90+ degrees of right knee flexion AROM to impove ability to perform ADLs and functional activities.    Time  2    Period  Weeks    Status  New        PT Long Term Goals - 07/17/17 2126      PT LONG TERM GOAL #1   Title  Patient will be independent with advanced HEP.    Time  8    Period  Weeks    Status  New      PT LONG TERM GOAL #2   Title  Patient will demonstrate 115 degreess or greater right knee flexion AROM to improve ability to perform ADLs and functional tasks.    Time  8    Period  Weeks    Status  New      PT LONG TERM GOAL #3   Title  Patient will demonstrate reciprocal  step over step stair negotiation utilizing both railings in order to safely enter and exit home.    Time  8    Period  Weeks    Status  New      PT LONG TERM GOAL #4   Title  Patient will ambulate community distances with least restrictive AD and less than 3/10 right knee pain.    Time  8    Period  Weeks    Status  New      PT LONG TERM GOAL #5   Title  Patient will demonstrate 4+/5 or greater R knee MMT in all planes for stability during gait and functional activities.    Time  8    Period  Weeks    Status  New            Plan - 07/20/17 1801    Clinical Impression Statement  Patient tolerated today's treatment fairly well although overall limited due to R groin strain. Patient able to tolerate Nustep well and complete lunges but limited with hip flexion activiely secondary to groin pain. Patient reports limitation with home health HEP due to groin pain. Patient very sensitive to STW of the R mid hip adductors. Patient more limited with PROM into extension due to pain. Normal modalities response noted following removal of the modalities.    Rehab Potential  Excellent    PT Frequency  2x / week    PT Duration  8 weeks    PT Treatment/Interventions  ADLs/Self Care Home Management;Moist Heat;Cryotherapy;Occupational psychologist;Therapeutic activities;Functional mobility training;Therapeutic exercise;Balance training;Patient/family education;Neuromuscular re-education;Scar mobilization;Vasopneumatic Device;Taping;Dry needling;Passive range of motion;Manual techniques    PT Next Visit Plan  Nustep, AROM/PROM, modalities PRN for pain relief.    PT Home Exercise Plan  cont exercises provided by home physical therapist    Consulted and Agree with Plan of Care  Patient       Patient will benefit from skilled therapeutic intervention  in order to improve the following deficits and impairments:  Pain, Decreased activity tolerance, Decreased range of motion,  Decreased strength, Difficulty walking, Increased edema, Decreased balance  Visit Diagnosis: Chronic pain of right knee  Stiffness of right knee, not elsewhere classified  Difficulty in walking, not elsewhere classified     Problem List Patient Active Problem List   Diagnosis Date Noted  . 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 Total knee replacement status, right 06/27/2017  . Primary osteoarthritis of right knee 02/15/2017  . Primary osteoarthritis of left knee 02/15/2017  . 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 Nonintractable episodic headache 02/15/2017  . Chronic obstructive pulmonary disease ([place]) 11/08/2016  . Pure hypercholesterolemia 11/08/2016  . Hypoxia 11/08/2016  . Tobacco abuse 11/08/2016    Standley Brooking, PTA 07/20/2017, 6:13 PM  [facility] [place], [place], [address] Phone: [phone]   Fax:  [phone]  Name: Arianna Haydon MRN: [id number] Date of Birth: 1945/07/05

## 2017-07-25 ENCOUNTER — Ambulatory Visit: Payer: Medicare Other | Admitting: *Deleted

## 2017-07-25 DIAGNOSIS — M25561 Pain in right knee: Secondary | ICD-10-CM | POA: Diagnosis not present

## 2017-07-25 DIAGNOSIS — M25661 Stiffness of right knee, not elsewhere classified: Secondary | ICD-10-CM | POA: Diagnosis not present

## 2017-07-25 DIAGNOSIS — G8929 Other chronic pain: Secondary | ICD-10-CM

## 2017-07-25 DIAGNOSIS — R262 Difficulty in walking, not elsewhere classified: Secondary | ICD-10-CM

## 2017-07-25 NOTE — Therapy (Signed)
[facility] [place], [place], [address] Phone: [phone]   Fax:  [phone]  Physical Therapy Treatment  Patient Details  Name: Joanne Kramer MRN: [id number] Date of Birth: 21-Apr-1945 Referring Provider: Joni Fears   Encounter Date: 07/25/2017  PT End of Session - 07/25/17 1652    Visit Number  3    Number of Visits  16    Date for PT Re-Evaluation  10/15/17    Authorization Type  FOTO every 5th visit, progress note every 10th visit, KX modifier at 15th visit    PT Start Time  1645    PT Stop Time  1744    PT Time Calculation (min)  59 min       Past Medical History:  Diagnosis Date  . Cataract   . COPD (chronic obstructive pulmonary disease) ([place])   . Emphysema of lung ([place])   . Headache   . OA (osteoarthritis) of knee    right  . Pneumonia   . Requires supplemental oxygen     Past Surgical History:  Procedure Laterality Date  . BREAST SURGERY     left breast aspiration  . CESAREAN SECTION    . COLONOSCOPY    . EYE SURGERY    . KNEE SURGERY Right   . TONSILLECTOMY    . TOTAL KNEE ARTHROPLASTY Right 06/27/2017   Procedure: RIGHT TOTAL KNEE ARTHROPLASTY;  Surgeon: Garald Balding, MD;  Location: [place];  Service: Orthopedics;  Laterality: Right;    There were no vitals filed for this visit.  Subjective Assessment - 07/25/17 1650    Subjective  Did ok after last Rx.    Pertinent History  R TKA 06/27/17; COPD    Limitations  House hold activities;Standing;Walking    Currently in Pain?  Yes    Pain Score  2     Pain Orientation  Right    Pain Descriptors / Indicators  Sore    Pain Type  Surgical pain    Pain Onset  1 to 4 weeks ago                       [facility] Adult PT Treatment/Exercise - 07/25/17 0001      Exercises   Exercises  Knee/Hip      Knee/Hip Exercises: Aerobic   Nustep  L4 x20  min seat 10,9      Knee/Hip Exercises: Standing   Forward Lunges  Right;2  sets;10 reps 8inch    Rocker Board  3 minutes calf stretching      Knee/Hip Exercises: Seated   Long Arc Quad  Strengthening;2 sets;10 reps    Long Arc Quad Weight  2 lbs.      Modalities   Modalities  Psychologist, educational Location  R knee IFC 1-10hz  x 15 mins    Electrical Stimulation Goals  Pain;Edema      Vasopneumatic   Number Minutes Vasopneumatic   10 minutes    Vasopnuematic Location   Knee    Vasopneumatic Pressure  Low    Vasopneumatic Temperature   34      Manual Therapy   Manual Therapy  Soft tissue mobilization;Passive ROM    Passive ROM  PROM of R knee into flexion, extension with holds at end range               PT Short Term Goals - 07/17/17 2124  PT SHORT TERM GOAL #1   Title  Patient will demonstrate full right knee extension AROM in order to normalize gait pattern.    Time  2    Period  Weeks    Status  New      PT SHORT TERM GOAL #2   Title  Patient will demonstrate 90+ degrees of right knee flexion AROM to impove ability to perform ADLs and functional activities.    Time  2    Period  Weeks    Status  New        PT Long Term Goals - 07/17/17 2126      PT LONG TERM GOAL #1   Title  Patient will be independent with advanced HEP.    Time  8    Period  Weeks    Status  New      PT LONG TERM GOAL #2   Title  Patient will demonstrate 115 degreess or greater right knee flexion AROM to improve ability to perform ADLs and functional tasks.    Time  8    Period  Weeks    Status  New      PT LONG TERM GOAL #3   Title  Patient will demonstrate reciprocal step over step stair negotiation utilizing both railings in order to safely enter and exit home.    Time  8    Period  Weeks    Status  New      PT LONG TERM GOAL #4   Title  Patient will ambulate community distances with least restrictive AD and less than 3/10 right knee pain.    Time  8    Period  Weeks     Status  New      PT LONG TERM GOAL #5   Title  Patient will demonstrate 4+/5 or greater R knee MMT in all planes for stability during gait and functional activities.    Time  8    Period  Weeks    Status  New            Plan - 07/25/17 1751    Clinical Impression Statement  Pt arrived  today doing fairly well with RT TKA. She reports that her groin is feeling much better now with just a little soreness. She was able to complete all therex and act,s with minimal  complaints. She was sore with patella mobs along lateral aspect RT knee. Normal modality response.    Clinical Presentation  Stable    Clinical Decision Making  Low    Rehab Potential  Excellent    PT Frequency  2x / week    PT Duration  8 weeks    PT Treatment/Interventions  ADLs/Self Care Home Management;Moist Heat;Cryotherapy;Occupational psychologist;Therapeutic activities;Functional mobility training;Therapeutic exercise;Balance training;Patient/family education;Neuromuscular re-education;Scar mobilization;Vasopneumatic Device;Taping;Dry needling;Passive range of motion;Manual techniques    PT Next Visit Plan  Nustep, AROM/PROM, modalities PRN for pain relief.    PT Home Exercise Plan  cont exercises provided by home physical therapist    Consulted and Agree with Plan of Care  Patient       Patient will benefit from skilled therapeutic intervention in order to improve the following deficits and impairments:  Pain, Decreased activity tolerance, Decreased range of motion, Decreased strength, Difficulty walking, Increased edema, Decreased balance  Visit Diagnosis: Chronic pain of right knee  Stiffness of right knee, not elsewhere classified  Difficulty in walking, not elsewhere classified     Problem List  Patient Active Problem List   Diagnosis Date Noted  . 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 Total knee replacement status, right 06/27/2017  . Primary osteoarthritis  of right knee 02/15/2017  . Primary osteoarthritis of left knee 02/15/2017  . 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 Nonintractable episodic headache 02/15/2017  . Chronic obstructive pulmonary disease ([place]) 11/08/2016  . Pure hypercholesterolemia 11/08/2016  . Hypoxia 11/08/2016  . Tobacco abuse 11/08/2016    Garett Tetzloff,[name], PTA 07/25/2017, 6:07 PM  [facility] [address] [place], [place], [address] Phone: [phone]   Fax:  [phone]  Name: Joanne Kramer MRN: [id number] Date of Birth: January 24, 1946

## 2017-07-26 ENCOUNTER — Encounter (INDEPENDENT_AMBULATORY_CARE_PROVIDER_SITE_OTHER): Payer: Self-pay | Admitting: Orthopaedic Surgery

## 2017-07-26 ENCOUNTER — Ambulatory Visit (INDEPENDENT_AMBULATORY_CARE_PROVIDER_SITE_OTHER): Payer: Medicare Other | Admitting: Orthopaedic Surgery

## 2017-07-26 VITALS — BP 116/69 | HR 89 | Ht 66.0 in | Wt 170.0 lb

## 2017-07-26 DIAGNOSIS — Z96651 Presence of right artificial knee joint: Secondary | ICD-10-CM

## 2017-07-26 NOTE — Progress Notes (Deleted)
   Office Visit Note   Patient: Joanne Kramer           Date of Birth: 08-12-1945           MRN: [id number] Visit Date: 07/26/2017              Requested by: Terald Sleeper, PA-C [place], [place] [address] PCP: Terald Sleeper, PA-C   Assessment & Plan: Visit Diagnoses: No diagnosis found.  Plan: ***  Follow-Up Instructions: No follow-ups on file.   Orders:  No orders of the defined types were placed in this encounter.  No orders of the defined types were placed in this encounter.     Procedures: No procedures performed   Clinical Data: No additional findings.   Subjective: No chief complaint on file.   HPI  Review of Systems  Constitutional: Negative for fatigue and fever.  HENT: Negative for ear pain.   Eyes: Negative for pain.  Respiratory: Negative for cough.   Cardiovascular: Positive for leg swelling.  Gastrointestinal: Positive for diarrhea.  Genitourinary: Negative for difficulty urinating.  Musculoskeletal: Negative for back pain.  Skin: Negative for rash.  Allergic/Immunologic: Negative for food allergies.  Neurological: Positive for weakness.  Hematological: Bruises/bleeds easily.  Psychiatric/Behavioral: Positive for sleep disturbance.     Objective: Vital Signs: There were no vitals taken for this visit.  Physical Exam  Ortho Exam  Specialty Comments:  No specialty comments available.  Imaging: No results found.   PMFS History: Patient Active Problem List   Diagnosis Date Noted  . 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 Total knee replacement status, right 06/27/2017  . Primary osteoarthritis of right knee 02/15/2017  . Primary osteoarthritis of left knee 02/15/2017  . 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 Nonintractable episodic headache 02/15/2017  . Chronic obstructive pulmonary disease ([place]) 11/08/2016  . Pure hypercholesterolemia 11/08/2016  . Hypoxia 11/08/2016  . Tobacco abuse  11/08/2016   Past Medical History:  Diagnosis Date  . Cataract   . COPD (chronic obstructive pulmonary disease) ([place])   . Emphysema of lung ([place])   . Headache   . OA (osteoarthritis) of knee    right  . Pneumonia   . Requires supplemental oxygen     Family History  Adopted: Yes    Past Surgical History:  Procedure Laterality Date  . BREAST SURGERY     left breast aspiration  . CESAREAN SECTION    . COLONOSCOPY    . EYE SURGERY    . KNEE SURGERY Right   . TONSILLECTOMY    . TOTAL KNEE ARTHROPLASTY Right 06/27/2017   Procedure: RIGHT TOTAL KNEE ARTHROPLASTY;  Surgeon: Garald Balding, MD;  Location: [place];  Service: Orthopedics;  Laterality: Right;   Social History   Occupational History  . Not on file  Tobacco Use  . Smoking status: Current Some Day Smoker    Packs/day: 1.00    Years: 55.00    Pack years: 55.00    Types: Cigarettes    Last attempt to quit: 05/07/2017    Years since quitting: 0.2  . Smokeless tobacco: Never Used  Substance and Sexual Activity  . Alcohol use: No  . Drug use: No  . Sexual activity: Not Currently

## 2017-07-26 NOTE — Progress Notes (Signed)
Office Visit Note   Patient: Joanne Kramer           Date of Birth: 01-12-1946           MRN: [id number] Visit Date: 07/26/2017              Requested by: Terald Sleeper, PA-C [address] [place], [place] [address] PCP: Terald Sleeper, PA-C   Assessment & Plan: Visit Diagnoses:  1. History of total right knee replacement     Plan: 1 month status post primary right total knee replacement doing exceptionally well.  Continues to go to outpatient therapy.  Uses a cane.  No pain meds.  Continue with exercises.  Office 6 weeks  Follow-Up Instructions: Return in about 6 weeks (around 09/06/2017).   Orders:  No orders of the defined types were placed in this encounter.  No orders of the defined types were placed in this encounter.     Procedures: No procedures performed   Clinical Data: No additional findings.   Subjective: No chief complaint on file. 1 month status post primary right total knee replacement doing quite well.  Attending outpatient physical therapy without a problem.  Very happy with her present results.  HPI  Review of Systems   Objective: Vital Signs: There were no vitals taken for this visit.  Physical Exam  Ortho Exam awake alert and oriented x3.  Comfortable sitting.  Right knee with full quick extension.  Incision is healed without any problems.  Flexes about 98 degrees.  No instability.  No calf pain.  Minimal ankle swelling.  Neurovascular exam intact.  Specialty Comments:  No specialty comments available.  Imaging: No results found.   PMFS History: Patient Active Problem List   Diagnosis Date Noted  . 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 Total knee replacement status, right 06/27/2017  . Primary osteoarthritis of right knee 02/15/2017  . Primary osteoarthritis of left knee 02/15/2017  . 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 Nonintractable episodic headache 02/15/2017  . Chronic obstructive pulmonary  disease ([place]) 11/08/2016  . Pure hypercholesterolemia 11/08/2016  . Hypoxia 11/08/2016  . Tobacco abuse 11/08/2016   Past Medical History:  Diagnosis Date  . Cataract   . COPD (chronic obstructive pulmonary disease) ([place])   . Emphysema of lung ([place])   . Headache   . OA (osteoarthritis) of knee    right  . Pneumonia   . Requires supplemental oxygen     Family History  Adopted: Yes    Past Surgical History:  Procedure Laterality Date  . BREAST SURGERY     left breast aspiration  . CESAREAN SECTION    . COLONOSCOPY    . EYE SURGERY    . KNEE SURGERY Right   . TONSILLECTOMY    . TOTAL KNEE ARTHROPLASTY Right 06/27/2017   Procedure: RIGHT TOTAL KNEE ARTHROPLASTY;  Surgeon: Garald Balding, MD;  Location: [place];  Service: Orthopedics;  Laterality: Right;   Social History   Occupational History  . Not on file  Tobacco Use  . Smoking status: Current Some Day Smoker    Packs/day: 1.00    Years: 55.00    Pack years: 55.00    Types: Cigarettes    Last attempt to quit: 05/07/2017    Years since quitting: 0.2  . Smokeless tobacco: Never Used  Substance and Sexual Activity  . Alcohol use: No  . Drug use: No  . Sexual activity: Not  Currently     Garald Balding, MD   Note - This record has been created using [employer].  Chart creation errors have been sought, but may not always  have been located. Such creation errors do not reflect on  the standard of medical care.

## 2017-07-27 ENCOUNTER — Ambulatory Visit: Payer: Medicare Other | Admitting: *Deleted

## 2017-07-27 ENCOUNTER — Other Ambulatory Visit: Payer: Self-pay | Admitting: *Deleted

## 2017-07-27 DIAGNOSIS — G8929 Other chronic pain: Secondary | ICD-10-CM | POA: Diagnosis not present

## 2017-07-27 DIAGNOSIS — M25561 Pain in right knee: Principal | ICD-10-CM

## 2017-07-27 DIAGNOSIS — R262 Difficulty in walking, not elsewhere classified: Secondary | ICD-10-CM

## 2017-07-27 DIAGNOSIS — J449 Chronic obstructive pulmonary disease, unspecified: Secondary | ICD-10-CM

## 2017-07-27 DIAGNOSIS — M25661 Stiffness of right knee, not elsewhere classified: Secondary | ICD-10-CM | POA: Diagnosis not present

## 2017-07-27 MED ORDER — IPRATROPIUM-ALBUTEROL 20-100 MCG/ACT IN AERS: 1.0000 | INHALATION_SPRAY | RESPIRATORY_TRACT | 11 refills | Status: DC | PRN

## 2017-07-27 NOTE — Therapy (Signed)
[facility] [place], [place], [address] Phone: [phone]   Fax:  [phone]  Physical Therapy Treatment  Patient Details  Name: Joanne Kramer MRN: [id number] Date of Birth: December 04, 1945 Referring Provider: Joni Fears   Encounter Date: 07/27/2017  PT End of Session - 07/27/17 1737    Visit Number  4    Number of Visits  16    Date for PT Re-Evaluation  10/15/17    Authorization Type  FOTO every 5th visit, progress note every 10th visit, KX modifier at 15th visit    PT Start Time  1645    PT Stop Time  1745    PT Time Calculation (min)  60 min       Past Medical History:  Diagnosis Date  . Cataract   . COPD (chronic obstructive pulmonary disease) ([place])   . Emphysema of lung ([place])   . Headache   . OA (osteoarthritis) of knee    right  . Pneumonia   . Requires supplemental oxygen     Past Surgical History:  Procedure Laterality Date  . BREAST SURGERY     left breast aspiration  . CESAREAN SECTION    . COLONOSCOPY    . EYE SURGERY    . KNEE SURGERY Right   . TONSILLECTOMY    . TOTAL KNEE ARTHROPLASTY Right 06/27/2017   Procedure: RIGHT TOTAL KNEE ARTHROPLASTY;  Surgeon: Garald Balding, MD;  Location: [place];  Service: Orthopedics;  Laterality: Right;    There were no vitals filed for this visit.  Subjective Assessment - 07/27/17 1706    Subjective  Did ok after last Rx. RT knee 1-2/10    Pertinent History  R TKA 06/27/17; COPD    Limitations  House hold activities;Standing;Walking    Pain Score  2     Pain Orientation  Right    Pain Descriptors / Indicators  Sore    Pain Onset  1 to 4 weeks ago                       [facility] Adult PT Treatment/Exercise - 07/27/17 0001      Exercises   Exercises  Knee/Hip      Knee/Hip Exercises: Aerobic   Nustep  L4 x 18 min seat 10,9      Knee/Hip Exercises: Standing   Forward Lunges  Right;2 sets;10 reps 8inch    Forward Step Up   Right;10 reps;2 sets;Step Height: 6"    Rocker Board  3 minutes calf stretching      Knee/Hip Exercises: Seated   Long Arc Quad  Strengthening;10 reps;3 sets    [employer] Weight  2 lbs.      Modalities   Modalities  Psychologist, educational Location  R knee IFC 1-10hz  x 15 mins    Electrical Stimulation Goals  Pain;Edema      Vasopneumatic   Number Minutes Vasopneumatic   10 minutes    Vasopnuematic Location   Knee    Vasopneumatic Pressure  Low    Vasopneumatic Temperature   34      Manual Therapy   Manual Therapy  Soft tissue mobilization;Passive ROM    Passive ROM  PROM of R knee into flexion, extension with holds at end range               PT Short Term Goals - 07/17/17 2124  PT SHORT TERM GOAL #1   Title  Patient will demonstrate full right knee extension AROM in order to normalize gait pattern.    Time  2    Period  Weeks    Status  New      PT SHORT TERM GOAL #2   Title  Patient will demonstrate 90+ degrees of right knee flexion AROM to impove ability to perform ADLs and functional activities.    Time  2    Period  Weeks    Status  New        PT Long Term Goals - 07/17/17 2126      PT LONG TERM GOAL #1   Title  Patient will be independent with advanced HEP.    Time  8    Period  Weeks    Status  New      PT LONG TERM GOAL #2   Title  Patient will demonstrate 115 degreess or greater right knee flexion AROM to improve ability to perform ADLs and functional tasks.    Time  8    Period  Weeks    Status  New      PT LONG TERM GOAL #3   Title  Patient will demonstrate reciprocal step over step stair negotiation utilizing both railings in order to safely enter and exit home.    Time  8    Period  Weeks    Status  New      PT LONG TERM GOAL #4   Title  Patient will ambulate community distances with least restrictive AD and less than 3/10 right knee pain.    Time  8     Period  Weeks    Status  New      PT LONG TERM GOAL #5   Title  Patient will demonstrate 4+/5 or greater R knee MMT in all planes for stability during gait and functional activities.    Time  8    Period  Weeks    Status  New            Plan - 07/27/17 1737    Clinical Impression Statement  Pt arrived doing fairly well after MD F/U and reports MD was pleased. Her incision looks great and was fairly mobile. She was able to perform progression of exs without complaints except during flexion and extension strtching.. Her extension ROM was -5 degrees today    Rehab Potential  Excellent    PT Frequency  2x / week    PT Duration  8 weeks    PT Treatment/Interventions  ADLs/Self Care Home Management;Moist Heat;Cryotherapy;Occupational psychologist;Therapeutic activities;Functional mobility training;Therapeutic exercise;Balance training;Patient/family education;Neuromuscular re-education;Scar mobilization;Vasopneumatic Device;Taping;Dry needling;Passive range of motion;Manual techniques    PT Next Visit Plan  Nustep, AROM/PROM, modalities PRN for pain relief.    PT Home Exercise Plan  cont exercises provided by home physical therapist       Patient will benefit from skilled therapeutic intervention in order to improve the following deficits and impairments:  Pain, Decreased activity tolerance, Decreased range of motion, Decreased strength, Difficulty walking, Increased edema, Decreased balance  Visit Diagnosis: Chronic pain of right knee  Stiffness of right knee, not elsewhere classified  Difficulty in walking, not elsewhere classified     Problem List Patient Active Problem List   Diagnosis Date Noted  . 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 Total knee replacement status, right 06/27/2017  . Primary osteoarthritis of right knee 02/15/2017  .  Primary osteoarthritis of left knee 02/15/2017  . Disorder of left common peroneal nerve  02/15/2017  . Nonintractable episodic headache 02/15/2017  . Chronic obstructive pulmonary disease ([place]) 11/08/2016  . Pure hypercholesterolemia 11/08/2016  . Hypoxia 11/08/2016  . Tobacco abuse 11/08/2016    [name],[name], PTA 07/27/2017, 5:46 PM  [facility] [address] [place], [place], [address] Phone: [phone]   Fax:  [phone]  Name: Joanne Kramer MRN: [id number] Date of Birth: Oct 15, 1945

## 2017-07-28 DIAGNOSIS — J449 Chronic obstructive pulmonary disease, unspecified: Secondary | ICD-10-CM | POA: Diagnosis not present

## 2017-08-01 ENCOUNTER — Encounter: Payer: Self-pay | Admitting: Physical Therapy

## 2017-08-01 ENCOUNTER — Ambulatory Visit: Payer: Medicare Other | Attending: Orthopaedic Surgery | Admitting: Physical Therapy

## 2017-08-01 DIAGNOSIS — M25561 Pain in right knee: Secondary | ICD-10-CM | POA: Diagnosis not present

## 2017-08-01 DIAGNOSIS — G8929 Other chronic pain: Secondary | ICD-10-CM | POA: Diagnosis not present

## 2017-08-01 DIAGNOSIS — M25661 Stiffness of right knee, not elsewhere classified: Secondary | ICD-10-CM | POA: Diagnosis not present

## 2017-08-01 DIAGNOSIS — R262 Difficulty in walking, not elsewhere classified: Secondary | ICD-10-CM | POA: Diagnosis not present

## 2017-08-01 NOTE — Therapy (Signed)
[facility] [place], [place], [address] Phone: [phone]   Fax:  [phone]  Physical Therapy Treatment  Patient Details  Name: Joanne Kramer MRN: [id number] Date of Birth: [birth year] Referring Provider: Joni Fears   Encounter Date: 08/01/2017  PT End of Session - 08/01/17 1524    Visit Number  5    Number of Visits  16    Date for PT Re-Evaluation  10/15/17    Authorization Type  FOTO every 5th visit, progress note every 10th visit, KX modifier at 15th visit    PT Start Time  1516    PT Stop Time  1610    PT Time Calculation (min)  54 min    Activity Tolerance  Patient tolerated treatment well    Behavior During Therapy  [facility] for tasks assessed/performed       Past Medical History:  Diagnosis Date  . Cataract   . COPD (chronic obstructive pulmonary disease) ([place])   . Emphysema of lung ([place])   . Headache   . OA (osteoarthritis) of knee    right  . Pneumonia   . Requires supplemental oxygen     Past Surgical History:  Procedure Laterality Date  . BREAST SURGERY     left breast aspiration  . CESAREAN SECTION    . COLONOSCOPY    . EYE SURGERY    . KNEE SURGERY Right   . TONSILLECTOMY    . TOTAL KNEE ARTHROPLASTY Right 06/27/2017   Procedure: RIGHT TOTAL KNEE ARTHROPLASTY;  Surgeon: Garald Balding, MD;  Location: [place];  Service: Orthopedics;  Laterality: Right;    There were no vitals filed for this visit.  Subjective Assessment - 08/01/17 1524    Subjective  Reports her knee is better.    Pertinent History  R TKA 06/27/17; COPD    Limitations  House hold activities;Standing;Walking    Currently in Pain?  Yes    Pain Score  2     Pain Location  Knee    Pain Orientation  Right    Pain Descriptors / Indicators  Aching;Sore    Pain Type  Surgical pain    Pain Onset  1 to 4 weeks ago    Pain Frequency  Constant         [facility] PT Assessment - 08/01/17 0001      Assessment   Medical  Diagnosis  Right TKA    Onset Date/Surgical Date  06/27/17    Next MD Visit  07/2017    Prior Therapy  yes      ROM / Strength   AROM / PROM / Strength  AROM      AROM   Overall AROM   Deficits    AROM Assessment Site  Knee    Right/Left Knee  Right    Right Knee Extension  -3    Right Knee Flexion  111                   [facility] Adult PT Treatment/Exercise - 08/01/17 0001      Knee/Hip Exercises: Aerobic   Nustep  L5 x 18 min seat 10,9      Knee/Hip Exercises: Standing   Forward Lunges  Right;2 sets;10 reps    Forward Step Up  Right;10 reps;2 sets;Step Height: 6"    Rocker Board  3 minutes      Knee/Hip Exercises: Seated   Long Arc Quad  Strengthening;Right;2 sets;10 reps;Weights  Long Arc Quad Weight  4 lbs.      Modalities   Modalities  Management consultant  IFC    Electrical Stimulation Parameters  1-10 hz x15 min    Electrical Stimulation Goals  Pain;Edema      Vasopneumatic   Number Minutes Vasopneumatic   15 minutes    Vasopnuematic Location   Knee    Vasopneumatic Pressure  Low    Vasopneumatic Temperature   34      Manual Therapy   Manual Therapy  Soft tissue mobilization;Passive ROM    Soft tissue mobilization  R knee incision mobilizations to ensure proper mobility    Passive ROM  PROM of R knee into flexion with holds at end range               PT Short Term Goals - 08/01/17 1603      PT SHORT TERM GOAL #1   Title  Patient will demonstrate full right knee extension AROM in order to normalize gait pattern.    Time  2    Period  Weeks    Status  On-going -3 deg from neutral R knee 08/01/2017      PT SHORT TERM GOAL #2   Title  Patient will demonstrate 90+ degrees of right knee flexion AROM to impove ability to perform ADLs and functional activities.    Time  2    Period  Weeks    Status  Achieved        PT  Long Term Goals - 08/01/17 1603      PT LONG TERM GOAL #1   Title  Patient will be independent with advanced HEP.    Time  8    Period  Weeks    Status  On-going      PT LONG TERM GOAL #2   Title  Patient will demonstrate 115 degreess or greater right knee flexion AROM to improve ability to perform ADLs and functional tasks.    Time  8    Period  Weeks    Status  On-going AROM R knee 111 deg flexion 08/01/2017      PT LONG TERM GOAL #3   Title  Patient will demonstrate reciprocal step over step stair negotiation utilizing both railings in order to safely enter and exit home.    Time  8    Period  Weeks    Status  On-going      PT LONG TERM GOAL #4   Title  Patient will ambulate community distances with least restrictive AD and less than 3/10 right knee pain.    Time  8    Period  Weeks    Status  On-going      PT LONG TERM GOAL #5   Title  Patient will demonstrate 4+/5 or greater R knee MMT in all planes for stability during gait and functional activities.    Time  8    Period  Weeks    Status  On-going            Plan - 08/01/17 1653    Clinical Impression Statement  Patient continues to improve with low level L knee pain and no groin pain. Patient progressed with step ups but patient unable to fully extend R knee. Patient able to tolerate therex well with no complaints. AROM of R knee measured as  3-111 deg and fairly mobile incision. Normal modalities response noted following removal of the modalities.    Rehab Potential  Excellent    PT Frequency  2x / week    PT Duration  8 weeks    PT Treatment/Interventions  ADLs/Self Care Home Management;Moist Heat;Cryotherapy;Occupational psychologist;Therapeutic activities;Functional mobility training;Therapeutic exercise;Balance training;Patient/family education;Neuromuscular re-education;Scar mobilization;Vasopneumatic Device;Taping;Dry needling;Passive range of motion;Manual techniques    PT Next Visit  Plan  Nustep, AROM/PROM, modalities PRN for pain relief.    PT Home Exercise Plan  cont exercises provided by home physical therapist    Consulted and Agree with Plan of Care  Patient       Patient will benefit from skilled therapeutic intervention in order to improve the following deficits and impairments:  Pain, Decreased activity tolerance, Decreased range of motion, Decreased strength, Difficulty walking, Increased edema, Decreased balance  Visit Diagnosis: Chronic pain of right knee  Stiffness of right knee, not elsewhere classified  Difficulty in walking, not elsewhere classified     Problem List Patient Active Problem List   Diagnosis Date Noted  . 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 Total knee replacement status, right 06/27/2017  . Primary osteoarthritis of right knee 02/15/2017  . Primary osteoarthritis of left knee 02/15/2017  . 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 Nonintractable episodic headache 02/15/2017  . Chronic obstructive pulmonary disease ([place]) 11/08/2016  . Pure hypercholesterolemia 11/08/2016  . Hypoxia 11/08/2016  . Tobacco abuse 11/08/2016    Standley Brooking, PTA 08/01/2017, 4:55 PM  [name] [address] [place], [place], [address] Phone: [phone]   Fax:  [phone]  Name: Areana Kosanke MRN: [id number] Date of Birth: Dec 26, 1945

## 2017-08-03 ENCOUNTER — Ambulatory Visit: Payer: Medicare Other | Admitting: *Deleted

## 2017-08-03 DIAGNOSIS — G8929 Other chronic pain: Secondary | ICD-10-CM | POA: Diagnosis not present

## 2017-08-03 DIAGNOSIS — M25661 Stiffness of right knee, not elsewhere classified: Secondary | ICD-10-CM

## 2017-08-03 DIAGNOSIS — R262 Difficulty in walking, not elsewhere classified: Secondary | ICD-10-CM | POA: Diagnosis not present

## 2017-08-03 DIAGNOSIS — M25561 Pain in right knee: Principal | ICD-10-CM

## 2017-08-03 NOTE — Therapy (Signed)
[facility] [place], [place], [address] Phone: [phone]   Fax:  [phone]  Physical Therapy Treatment  Patient Details  Name: Joanne Kramer MRN: [id number] Date of Birth: 1945-08-19 Referring Provider: Joni Fears   Encounter Date: 08/03/2017  PT End of Session - 08/03/17 1658    Visit Number  6    Number of Visits  16    Date for PT Re-Evaluation  10/15/17    Authorization Type  FOTO every 5th visit, progress note every 10th visit, KX modifier at 15th visit    PT Start Time  1631    PT Stop Time  1730    PT Time Calculation (min)  59 min       Past Medical History:  Diagnosis Date  . Cataract   . COPD (chronic obstructive pulmonary disease) ([place])   . Emphysema of lung ([place])   . Headache   . OA (osteoarthritis) of knee    right  . Pneumonia   . Requires supplemental oxygen     Past Surgical History:  Procedure Laterality Date  . BREAST SURGERY     left breast aspiration  . CESAREAN SECTION    . COLONOSCOPY    . EYE SURGERY    . KNEE SURGERY Right   . TONSILLECTOMY    . TOTAL KNEE ARTHROPLASTY Right 06/27/2017   Procedure: RIGHT TOTAL KNEE ARTHROPLASTY;  Surgeon: Garald Balding, MD;  Location: [facility];  Service: Orthopedics;  Laterality: Right;    There were no vitals filed for this visit.  Subjective Assessment - 08/03/17 1647    Subjective  Reports her knee is doing better    Pertinent History  R TKA 06/27/17; COPD    Limitations  House hold activities;Standing;Walking    Currently in Pain?  Yes    Pain Score  2     Pain Location  Knee    Pain Orientation  Right    Pain Type  Surgical pain    Pain Onset  1 to 4 weeks ago    Pain Frequency  Constant                       [facility] Adult PT Treatment/Exercise - 08/03/17 0001      Exercises   Exercises  Knee/Hip      Knee/Hip Exercises: Aerobic   Stationary Bike  seat 7, 0 resistance x 17 mins      Knee/Hip Exercises:  Standing   Forward Lunges  Right;10 reps;1 set hold 10 secs    Forward Step Up  Right;10 reps;Step Height: 6";3 sets      Knee/Hip Exercises: Seated   Long Arc Quad  Strengthening;Right;2 sets;10 reps;Weights    Long Arc Quad Weight  4 lbs.      Modalities   Modalities  Psychologist, educational Location  R knee IFC 1-10hz  x 15 mins    Electrical Stimulation Goals  Pain;Edema      Vasopneumatic   Number Minutes Vasopneumatic   15 minutes    Vasopnuematic Location   Knee    Vasopneumatic Pressure  Low    Vasopneumatic Temperature   34      Manual Therapy   Manual Therapy  Soft tissue mobilization;Passive ROM    Passive ROM  PROM of R knee into extension with holds at end range  PT Short Term Goals - 08/01/17 1603      PT SHORT TERM GOAL #1   Title  Patient will demonstrate full right knee extension AROM in order to normalize gait pattern.    Time  2    Period  Weeks    Status  On-going -3 deg from neutral R knee 08/01/2017      PT SHORT TERM GOAL #2   Title  Patient will demonstrate 90+ degrees of right knee flexion AROM to impove ability to perform ADLs and functional activities.    Time  2    Period  Weeks    Status  Achieved        PT Long Term Goals - 08/01/17 1603      PT LONG TERM GOAL #1   Title  Patient will be independent with advanced HEP.    Time  8    Period  Weeks    Status  On-going      PT LONG TERM GOAL #2   Title  Patient will demonstrate 115 degreess or greater right knee flexion AROM to improve ability to perform ADLs and functional tasks.    Time  8    Period  Weeks    Status  On-going AROM R knee 111 deg flexion 08/01/2017      PT LONG TERM GOAL #3   Title  Patient will demonstrate reciprocal step over step stair negotiation utilizing both railings in order to safely enter and exit home.    Time  8    Period  Weeks    Status  On-going      PT LONG TERM  GOAL #4   Title  Patient will ambulate community distances with least restrictive AD and less than 3/10 right knee pain.    Time  8    Period  Weeks    Status  On-going      PT LONG TERM GOAL #5   Title  Patient will demonstrate 4+/5 or greater R knee MMT in all planes for stability during gait and functional activities.    Time  8    Period  Weeks    Status  On-going            Plan - 08/03/17 1719    Clinical Impression Statement  Pt arrived today doing fairly well with mainly just soreness in RT knee. She was able to perform therex with mainly soreness in medial aspect during passive knee flexion and step ups. Pt still needs UE assistance with step ups, but was able to progress LAQs to 5#s. Normal modality response after modality removal       Patient will benefit from skilled therapeutic intervention in order to improve the following deficits and impairments:     Visit Diagnosis: Chronic pain of right knee  Stiffness of right knee, not elsewhere classified  Difficulty in walking, not elsewhere classified     Problem List Patient Active Problem List   Diagnosis Date Noted  . 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 Total knee replacement status, right 06/27/2017  . Primary osteoarthritis of right knee 02/15/2017  . Primary osteoarthritis of left knee 02/15/2017  . 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 Nonintractable episodic headache 02/15/2017  . Chronic obstructive pulmonary disease ([place]) 11/08/2016  . Pure hypercholesterolemia 11/08/2016  . Hypoxia 11/08/2016  . Tobacco abuse 11/08/2016    RAMSEUR,[name], PTA 08/03/2017, 5:52 PM  [facility] [address]  [place], [place], [address] Phone: [phone]   Fax:  [phone]  Name: Joanne Kramer MRN: [id number] Date of Birth: 07-03-1945

## 2017-08-08 ENCOUNTER — Encounter: Payer: Self-pay | Admitting: Physical Therapy

## 2017-08-08 ENCOUNTER — Ambulatory Visit: Payer: Medicare Other | Admitting: Physical Therapy

## 2017-08-08 DIAGNOSIS — R262 Difficulty in walking, not elsewhere classified: Secondary | ICD-10-CM | POA: Diagnosis not present

## 2017-08-08 DIAGNOSIS — G8929 Other chronic pain: Secondary | ICD-10-CM

## 2017-08-08 DIAGNOSIS — M25561 Pain in right knee: Secondary | ICD-10-CM | POA: Diagnosis not present

## 2017-08-08 DIAGNOSIS — M25661 Stiffness of right knee, not elsewhere classified: Secondary | ICD-10-CM

## 2017-08-08 NOTE — Therapy (Signed)
[facility] [place], [place], [address] Phone: [phone]   Fax:  [phone]  Physical Therapy Treatment  Patient Details  Name: Joanne Kramer MRN: [id number] Date of Birth: [birth year] Referring Provider: Joni Fears   Encounter Date: 08/08/2017  PT End of Session - 08/08/17 1654    Visit Number  7    Number of Visits  16    Date for PT Re-Evaluation  10/15/17    Authorization Type  FOTO every 5th visit, progress note every 10th visit, KX modifier at 15th visit    PT Start Time  1652    PT Stop Time  1741    PT Time Calculation (min)  49 min    Activity Tolerance  Patient tolerated treatment well    Behavior During Therapy  [facility] for tasks assessed/performed       Past Medical History:  Diagnosis Date  . Cataract   . COPD (chronic obstructive pulmonary disease) ([place])   . Emphysema of lung ([place])   . Headache   . OA (osteoarthritis) of knee    right  . Pneumonia   . Requires supplemental oxygen     Past Surgical History:  Procedure Laterality Date  . BREAST SURGERY     left breast aspiration  . CESAREAN SECTION    . COLONOSCOPY    . EYE SURGERY    . KNEE SURGERY Right   . TONSILLECTOMY    . TOTAL KNEE ARTHROPLASTY Right 06/27/2017   Procedure: RIGHT TOTAL KNEE ARTHROPLASTY;  Surgeon: Garald Balding, MD;  Location: [facility];  Service: Orthopedics;  Laterality: Right;    There were no vitals filed for this visit.  Subjective Assessment - 08/08/17 1653    Subjective  Reports that her knee is "good" today.    Pertinent History  R TKA 06/27/17; COPD    Limitations  House hold activities;Standing;Walking    Currently in Pain?  Yes    Pain Score  2     Pain Location  Knee    Pain Orientation  Right    Pain Descriptors / Indicators  Discomfort    Pain Type  Surgical pain    Pain Onset  More than a month ago         [facility] PT Assessment - 08/08/17 0001      Assessment   Medical Diagnosis  Right  TKA    Onset Date/Surgical Date  06/27/17    Next MD Visit  09/05/2017    Prior Therapy  yes                   [facility] Adult PT Treatment/Exercise - 08/08/17 0001      Knee/Hip Exercises: Aerobic   Stationary Bike  seat 4, 0 resistance x 17 mins      Knee/Hip Exercises: Machines for Strengthening   Cybex Knee Extension  10# 3x10 reps    Cybex Knee Flexion  30# 3x10 reps      Knee/Hip Exercises: Standing   Forward Lunges  Right;2 sets;10 reps;2 seconds    Hip Abduction  AROM;Both;2 sets;10 reps;Knee straight    Wall Squat  1 set;10 reps;3 seconds      Modalities   Modalities  Psychologist, educational Location  R knee    Electrical Stimulation Action  IFC    Electrical Stimulation Parameters  80-150 hz x15 min    Electrical Stimulation Goals  Pain;Edema  Vasopneumatic   Number Minutes Vasopneumatic   15 minutes    Vasopnuematic Location   Knee    Vasopneumatic Pressure  Low    Vasopneumatic Temperature   34               PT Short Term Goals - 08/01/17 1603      PT SHORT TERM GOAL #1   Title  Patient will demonstrate full right knee extension AROM in order to normalize gait pattern.    Time  2    Period  Weeks    Status  On-going -3 deg from neutral R knee 08/01/2017      PT SHORT TERM GOAL #2   Title  Patient will demonstrate 90+ degrees of right knee flexion AROM to impove ability to perform ADLs and functional activities.    Time  2    Period  Weeks    Status  Achieved        PT Long Term Goals - 08/01/17 1603      PT LONG TERM GOAL #1   Title  Patient will be independent with advanced HEP.    Time  8    Period  Weeks    Status  On-going      PT LONG TERM GOAL #2   Title  Patient will demonstrate 115 degreess or greater right knee flexion AROM to improve ability to perform ADLs and functional tasks.    Time  8    Period  Weeks    Status  On-going AROM R knee 111 deg  flexion 08/01/2017      PT LONG TERM GOAL #3   Title  Patient will demonstrate reciprocal step over step stair negotiation utilizing both railings in order to safely enter and exit home.    Time  8    Period  Weeks    Status  On-going      PT LONG TERM GOAL #4   Title  Patient will ambulate community distances with least restrictive AD and less than 3/10 right knee pain.    Time  8    Period  Weeks    Status  On-going      PT LONG TERM GOAL #5   Title  Patient will demonstrate 4+/5 or greater R knee MMT in all planes for stability during gait and functional activities.    Time  8    Period  Weeks    Status  On-going            Plan - 08/08/17 1740    Clinical Impression Statement  Patient able to tolerate treatment well even as she was introduced to machine knee strengthening. Patient did report weakness with knee extension and knee flexion. Patient able to tolerate all standing exercises well with no complaints although minimal L lean with squats. Normal modalites response noted following removal of the modalities.    Rehab Potential  Excellent    PT Frequency  2x / week    PT Duration  8 weeks    PT Treatment/Interventions  ADLs/Self Care Home Management;Moist Heat;Cryotherapy;Occupational psychologist;Therapeutic activities;Functional mobility training;Therapeutic exercise;Balance training;Patient/family education;Neuromuscular re-education;Scar mobilization;Vasopneumatic Device;Taping;Dry needling;Passive range of motion;Manual techniques    PT Next Visit Plan  Nustep, AROM/PROM, modalities PRN for pain relief.    PT Home Exercise Plan  cont exercises provided by home physical therapist    Consulted and Agree with Plan of Care  Patient       Patient will benefit from skilled  therapeutic intervention in order to improve the following deficits and impairments:  Pain, Decreased activity tolerance, Decreased range of motion, Decreased strength,  Difficulty walking, Increased edema, Decreased balance  Visit Diagnosis: Chronic pain of right knee  Stiffness of right knee, not elsewhere classified  Difficulty in walking, not elsewhere classified     Problem List Patient Active Problem List   Diagnosis Date Noted  . 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 Total knee replacement status, right 06/27/2017  . Primary osteoarthritis of right knee 02/15/2017  . Primary osteoarthritis of left knee 02/15/2017  . 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 Nonintractable episodic headache 02/15/2017  . Chronic obstructive pulmonary disease ([place]) 11/08/2016  . Pure hypercholesterolemia 11/08/2016  . Hypoxia 11/08/2016  . Tobacco abuse 11/08/2016    Standley Brooking, PTA 08/08/2017, 5:50 PM  [facility] [address] [place], [place], [address] Phone: [phone]   Fax:  [phone]  Name: Joanne Kramer MRN: [id number] Date of Birth: [birth year]

## 2017-08-10 ENCOUNTER — Encounter: Payer: Self-pay | Admitting: Physical Therapy

## 2017-08-10 ENCOUNTER — Ambulatory Visit: Payer: Medicare Other | Admitting: Physical Therapy

## 2017-08-10 DIAGNOSIS — M25561 Pain in right knee: Principal | ICD-10-CM

## 2017-08-10 DIAGNOSIS — M25661 Stiffness of right knee, not elsewhere classified: Secondary | ICD-10-CM

## 2017-08-10 DIAGNOSIS — R262 Difficulty in walking, not elsewhere classified: Secondary | ICD-10-CM | POA: Diagnosis not present

## 2017-08-10 DIAGNOSIS — G8929 Other chronic pain: Secondary | ICD-10-CM | POA: Diagnosis not present

## 2017-08-10 NOTE — Therapy (Signed)
[facility] [place], [place], [address] Phone: [phone]   Fax:  [phone]  Physical Therapy Treatment  Patient Details  Name: Joanne Kramer MRN: [id number] Date of Birth: 11-Aug-1945 Referring Provider: Joni Fears   Encounter Date: 08/10/2017  PT End of Session - 08/10/17 1644    Visit Number  8    Number of Visits  16    Date for PT Re-Evaluation  10/15/17    Authorization Type  FOTO every 5th visit, progress note every 10th visit, KX modifier at 15th visit    PT Start Time  1641    PT Stop Time  1734    PT Time Calculation (min)  53 min    Activity Tolerance  Patient tolerated treatment well    Behavior During Therapy  [facility] for tasks assessed/performed       Past Medical History:  Diagnosis Date  . Cataract   . COPD (chronic obstructive pulmonary disease) ([place])   . Emphysema of lung ([place])   . Headache   . OA (osteoarthritis) of knee    right  . Pneumonia   . Requires supplemental oxygen     Past Surgical History:  Procedure Laterality Date  . BREAST SURGERY     left breast aspiration  . CESAREAN SECTION    . COLONOSCOPY    . EYE SURGERY    . KNEE SURGERY Right   . TONSILLECTOMY    . TOTAL KNEE ARTHROPLASTY Right 06/27/2017   Procedure: RIGHT TOTAL KNEE ARTHROPLASTY;  Surgeon: Garald Balding, MD;  Location: [facility];  Service: Orthopedics;  Laterality: Right;    There were no vitals filed for this visit.  Subjective Assessment - 08/10/17 1643    Subjective  Reports that the last treatment really worked her out.    Pertinent History  R TKA 06/27/17; COPD    Limitations  House hold activities;Standing;Walking    Currently in Pain?  Yes    Pain Score  3     Pain Location  Knee    Pain Orientation  Right    Pain Descriptors / Indicators  Sore    Pain Type  Surgical pain    Pain Onset  More than a month ago         [facility] PT Assessment - 08/10/17 0001      Assessment   Medical  Diagnosis  Right TKA    Onset Date/Surgical Date  06/27/17    Next MD Visit  09/05/2017    Prior Therapy  yes                   [facility] Adult PT Treatment/Exercise - 08/10/17 0001      Knee/Hip Exercises: Aerobic   Stationary Bike  seat 6, 0 resistance x 15 mins      Knee/Hip Exercises: Machines for Strengthening   Cybex Knee Extension  10# 2x10 reps    Cybex Knee Flexion  30# 2x10 reps      Knee/Hip Exercises: Standing   Forward Lunges  Right;1 set;10 reps;2 seconds      Modalities   Modalities  Electrical Stimulation;Vasopneumatic      Acupuncturist Location  R knee    Electrical Stimulation Action  IFC    Electrical Stimulation Parameters  80-150 hz x15 min    Electrical Stimulation Goals  Pain      Vasopneumatic   Number Minutes Vasopneumatic   15 minutes  Vasopnuematic Location   Knee    Vasopneumatic Pressure  Low    Vasopneumatic Temperature   34      Manual Therapy   Manual Therapy  Myofascial release    Myofascial Release  Gentle IASTW to R medial knee and superior knee to reduce tightness and pain               PT Short Term Goals - 08/01/17 1603      PT SHORT TERM GOAL #1   Title  Patient will demonstrate full right knee extension AROM in order to normalize gait pattern.    Time  2    Period  Weeks    Status  On-going -3 deg from neutral R knee 08/01/2017      PT SHORT TERM GOAL #2   Title  Patient will demonstrate 90+ degrees of right knee flexion AROM to impove ability to perform ADLs and functional activities.    Time  2    Period  Weeks    Status  Achieved        PT Long Term Goals - 08/01/17 1603      PT LONG TERM GOAL #1   Title  Patient will be independent with advanced HEP.    Time  8    Period  Weeks    Status  On-going      PT LONG TERM GOAL #2   Title  Patient will demonstrate 115 degreess or greater right knee flexion AROM to improve ability to perform ADLs and functional tasks.     Time  8    Period  Weeks    Status  On-going AROM R knee 111 deg flexion 08/01/2017      PT LONG TERM GOAL #3   Title  Patient will demonstrate reciprocal step over step stair negotiation utilizing both railings in order to safely enter and exit home.    Time  8    Period  Weeks    Status  On-going      PT LONG TERM GOAL #4   Title  Patient will ambulate community distances with least restrictive AD and less than 3/10 right knee pain.    Time  8    Period  Weeks    Status  On-going      PT LONG TERM GOAL #5   Title  Patient will demonstrate 4+/5 or greater R knee MMT in all planes for stability during gait and functional activities.    Time  8    Period  Weeks    Status  On-going            Plan - 08/10/17 1724    Clinical Impression Statement  Patient tolerated today's treatment well although she arrived with soreness from previous PT treatment. Patient able to complete exercises as directed with complaints of soreness and tightness in medial and superior knee. Lighter reps were completed as to not over exaggerate pain of R knee. Gentle IASTW completed to R medial and superior knee to reduce reported muslce tightness. Minimal redness response noted during IASTW session. Normal modalities response noted following removal of the modalities.        Patient will benefit from skilled therapeutic intervention in order to improve the following deficits and impairments:     Visit Diagnosis: Chronic pain of right knee  Stiffness of right knee, not elsewhere classified  Difficulty in walking, not elsewhere classified     Problem List Patient Active Problem List  Diagnosis Date Noted  . 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 Total knee replacement status, right 06/27/2017  . Primary osteoarthritis of right knee 02/15/2017  . Primary osteoarthritis of left knee 02/15/2017  . 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 Nonintractable episodic  headache 02/15/2017  . Chronic obstructive pulmonary disease ([place]) 11/08/2016  . Pure hypercholesterolemia 11/08/2016  . Hypoxia 11/08/2016  . Tobacco abuse 11/08/2016    Standley Brooking, PTA 08/10/2017, 5:37 PM  [facility] [address] [place], [place], [address] Phone: [phone]   Fax:  [phone]  Name: Janisse Ghan MRN: [id number] Date of Birth: May 20, 1945

## 2017-08-15 ENCOUNTER — Ambulatory Visit: Payer: Medicare Other | Admitting: Physical Therapy

## 2017-08-15 ENCOUNTER — Encounter: Payer: Self-pay | Admitting: Physical Therapy

## 2017-08-15 DIAGNOSIS — R262 Difficulty in walking, not elsewhere classified: Secondary | ICD-10-CM | POA: Diagnosis not present

## 2017-08-15 DIAGNOSIS — M25661 Stiffness of right knee, not elsewhere classified: Secondary | ICD-10-CM | POA: Diagnosis not present

## 2017-08-15 DIAGNOSIS — G8929 Other chronic pain: Secondary | ICD-10-CM | POA: Diagnosis not present

## 2017-08-15 DIAGNOSIS — M25561 Pain in right knee: Secondary | ICD-10-CM | POA: Diagnosis not present

## 2017-08-15 NOTE — Therapy (Signed)
[facility] [place], [place], [address] Phone: [phone]   Fax:  [phone]  Physical Therapy Treatment  Patient Details  Name: Joanne Kramer MRN: [id number] Date of Birth: 1945-03-16 Referring Provider: Joni Fears   Encounter Date: 08/15/2017  PT End of Session - 08/15/17 1122    Visit Number  9    Number of Visits  16    Date for PT Re-Evaluation  10/15/17    Authorization Type  FOTO every 5th visit, progress note every 10th visit, KX modifier at 15th visit    PT Start Time  1119    PT Stop Time  1206    PT Time Calculation (min)  47 min    Activity Tolerance  Patient tolerated treatment well    Behavior During Therapy  [facility] for tasks assessed/performed       Past Medical History:  Diagnosis Date  . Cataract   . COPD (chronic obstructive pulmonary disease) ([place])   . Emphysema of lung ([place])   . Headache   . OA (osteoarthritis) of knee    right  . Pneumonia   . Requires supplemental oxygen     Past Surgical History:  Procedure Laterality Date  . BREAST SURGERY     left breast aspiration  . CESAREAN SECTION    . COLONOSCOPY    . EYE SURGERY    . KNEE SURGERY Right   . TONSILLECTOMY    . TOTAL KNEE ARTHROPLASTY Right 06/27/2017   Procedure: RIGHT TOTAL KNEE ARTHROPLASTY;  Surgeon: Garald Balding, MD;  Location: [facility];  Service: Orthopedics;  Laterality: Right;    There were no vitals filed for this visit.  Subjective Assessment - 08/15/17 1122    Subjective  Reports that the superior knee feels tight although she has been vacuuming floors and cleaning the house.    Pertinent History  R TKA 06/27/17; COPD    Limitations  House hold activities;Standing;Walking    Currently in Pain?  Other (Comment) No pain assessment provided by patient         [facility] PT Assessment - 08/15/17 0001      Assessment   Medical Diagnosis  Right TKA    Onset Date/Surgical Date  06/27/17    Next MD Visit   09/05/2017    Prior Therapy  yes                   [facility] Adult PT Treatment/Exercise - 08/15/17 0001      Knee/Hip Exercises: Aerobic   Stationary Bike  L3 x12 min      Knee/Hip Exercises: Machines for Strengthening   Cybex Knee Extension  10# 3x10 reps    Cybex Knee Flexion  30# 3x10 reps      Modalities   Modalities  Psychologist, educational Location  R knee    Electrical Stimulation Action  IFC    Electrical Stimulation Parameters  80-150 hz x15 min    Electrical Stimulation Goals  Pain      Vasopneumatic   Number Minutes Vasopneumatic   15 minutes    Vasopnuematic Location   Knee    Vasopneumatic Pressure  Low    Vasopneumatic Temperature   34      Manual Therapy   Manual Therapy  Myofascial release    Myofascial Release  Gentle IASTW to R superior knee to reduce tightness and pain  PT Short Term Goals - 08/01/17 1603      PT SHORT TERM GOAL #1   Title  Patient will demonstrate full right knee extension AROM in order to normalize gait pattern.    Time  2    Period  Weeks    Status  On-going -3 deg from neutral R knee 08/01/2017      PT SHORT TERM GOAL #2   Title  Patient will demonstrate 90+ degrees of right knee flexion AROM to impove ability to perform ADLs and functional activities.    Time  2    Period  Weeks    Status  Achieved        PT Long Term Goals - 08/01/17 1603      PT LONG TERM GOAL #1   Title  Patient will be independent with advanced HEP.    Time  8    Period  Weeks    Status  On-going      PT LONG TERM GOAL #2   Title  Patient will demonstrate 115 degreess or greater right knee flexion AROM to improve ability to perform ADLs and functional tasks.    Time  8    Period  Weeks    Status  On-going AROM R knee 111 deg flexion 08/01/2017      PT LONG TERM GOAL #3   Title  Patient will demonstrate reciprocal step over step stair negotiation  utilizing both railings in order to safely enter and exit home.    Time  8    Period  Weeks    Status  On-going      PT LONG TERM GOAL #4   Title  Patient will ambulate community distances with least restrictive AD and less than 3/10 right knee pain.    Time  8    Period  Weeks    Status  On-going      PT LONG TERM GOAL #5   Title  Patient will demonstrate 4+/5 or greater R knee MMT in all planes for stability during gait and functional activities.    Time  8    Period  Weeks    Status  On-going            Plan - 08/15/17 1154    Clinical Impression Statement  Patient tolerated today's treatment well although she reported tightness from superior R knee. Patient guided through machine strengthening exercises with patient focusing on using RLE as much as she can. Patient very tender to IASTW as ITB was approached but good redness response over vastus lateralis region. Normal modalities response noted following removal of the modalities.    Rehab Potential  Excellent    PT Frequency  2x / week    PT Duration  8 weeks    PT Treatment/Interventions  ADLs/Self Care Home Management;Moist Heat;Cryotherapy;Occupational psychologist;Therapeutic activities;Functional mobility training;Therapeutic exercise;Balance training;Patient/family education;Neuromuscular re-education;Scar mobilization;Vasopneumatic Device;Taping;Dry needling;Passive range of motion;Manual techniques    PT Next Visit Plan  Nustep, AROM/PROM, modalities PRN for pain relief.    PT Home Exercise Plan  cont exercises provided by home physical therapist    Consulted and Agree with Plan of Care  Patient       Patient will benefit from skilled therapeutic intervention in order to improve the following deficits and impairments:  Pain, Decreased activity tolerance, Decreased range of motion, Decreased strength, Difficulty walking, Increased edema, Decreased balance  Visit Diagnosis: Chronic pain of  right knee  Stiffness of right knee,  not elsewhere classified  Difficulty in walking, not elsewhere classified     Problem List Patient Active Problem List   Diagnosis Date Noted  . 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 Total knee replacement status, right 06/27/2017  . Primary osteoarthritis of right knee 02/15/2017  . Primary osteoarthritis of left knee 02/15/2017  . 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 Nonintractable episodic headache 02/15/2017  . Chronic obstructive pulmonary disease ([place]) 11/08/2016  . Pure hypercholesterolemia 11/08/2016  . Hypoxia 11/08/2016  . Tobacco abuse 11/08/2016    Standley Brooking, PTA 08/15/2017, 12:11 PM  [facility] [address] [place], [place], [address] Phone: [phone]   Fax:  [phone]  Name: Joanne Kramer MRN: [id number] Date of Birth: 04-19-1945

## 2017-08-16 ENCOUNTER — Encounter: Payer: Self-pay | Admitting: Physician Assistant

## 2017-08-16 ENCOUNTER — Ambulatory Visit (INDEPENDENT_AMBULATORY_CARE_PROVIDER_SITE_OTHER): Payer: Medicare Other | Admitting: Physician Assistant

## 2017-08-16 VITALS — BP 128/77 | HR 90 | Temp 97.9°F | Ht 66.0 in | Wt 172.8 lb

## 2017-08-16 DIAGNOSIS — J449 Chronic obstructive pulmonary disease, unspecified: Secondary | ICD-10-CM

## 2017-08-16 DIAGNOSIS — M1712 Unilateral primary osteoarthritis, left knee: Secondary | ICD-10-CM

## 2017-08-16 DIAGNOSIS — M1711 Unilateral primary osteoarthritis, right knee: Secondary | ICD-10-CM

## 2017-08-17 ENCOUNTER — Ambulatory Visit: Payer: Medicare Other | Admitting: *Deleted

## 2017-08-17 DIAGNOSIS — M25561 Pain in right knee: Secondary | ICD-10-CM | POA: Diagnosis not present

## 2017-08-17 DIAGNOSIS — R262 Difficulty in walking, not elsewhere classified: Secondary | ICD-10-CM | POA: Diagnosis not present

## 2017-08-17 DIAGNOSIS — G8929 Other chronic pain: Secondary | ICD-10-CM

## 2017-08-17 DIAGNOSIS — M25661 Stiffness of right knee, not elsewhere classified: Secondary | ICD-10-CM

## 2017-08-17 NOTE — Progress Notes (Signed)
BP 128/77   Pulse 90   Temp 97.9 F (36.6 C) (Oral)   Ht 5\' 6"  (1.676 m)   Wt 172 lb 12.8 oz (78.4 kg)   BMI 27.89 kg/m    Subjective:    Patient ID: Joanne Kramer, female    DOB: 06-14-1945, 72 y.o.   MRN: [id number]  HPI: Joanne Kramer is a 72 y.o. female presenting on 08/16/2017 for Hyperlipidemia (6 month follow up ) and COPD  This patient comes in for a 18-month recheck on her chronic medical conditions.  She is doing well with her COPD and being followed by pulmonology.  She had a right knee total replacement and is doing very well she is approximately 8 weeks out and is at 120 degrees of flexion.  She is still attending physical therapy.  She has not been released to drive yet.  Past Medical History:  Diagnosis Date  . Cataract   . COPD (chronic obstructive pulmonary disease) ([place])   . Emphysema of lung ([place])   . Headache   . OA (osteoarthritis) of knee    right  . Pneumonia   . Requires supplemental oxygen    Relevant past medical, surgical, family and social history reviewed and updated as indicated. Interim medical history since our last visit reviewed. Allergies and medications reviewed and updated. DATA REVIEWED: CHART IN EPIC  Family History reviewed for pertinent findings.  Review of Systems  Allergies as of 08/16/2017   No Known Allergies     Medication List        Accurate as of 08/16/17 11:59 PM. Always use your most recent med list.          ADVAIR DISKUS 250-50 MCG/DOSE Aepb Generic drug:  Fluticasone-Salmeterol Inhale 1 puff into the lungs 2 (two) times daily.   CALCIUM 600+D3 PO Take 1 tablet by mouth daily.   calcium carbonate 750 MG chewable tablet Commonly known as:  TUMS EX Chew 1-2 tablets by mouth 3 (three) times daily as needed (for heartburn/indigestion.).   Ipratropium-Albuterol 20-100 MCG/ACT Aers respimat Commonly known as:  COMBIVENT RESPIMAT Inhale 1 puff into the lungs every 4 (four) hours as needed ((typically  4x's daily)).   Melatonin 5 MG Tabs Take 5 mg by mouth at bedtime as needed (for sleep.).   multivitamin with minerals Tabs tablet Take 1 tablet by mouth daily.   OXYGEN Inhale 2 L into the lungs at bedtime.   simvastatin 20 MG tablet Commonly known as:  ZOCOR Take 20 mg by mouth daily with supper.   triamcinolone cream 0.1 % Commonly known as:  KENALOG Apply 1 application topically 2 (two) times daily as needed (for skin irritation/contact dermatitis).          Objective:    BP 128/77   Pulse 90   Temp 97.9 F (36.6 C) (Oral)   Ht 5\' 6"  (1.676 m)   Wt 172 lb 12.8 oz (78.4 kg)   BMI 27.89 kg/m   No Known Allergies  Wt Readings from Last 3 Encounters:  08/16/17 172 lb 12.8 oz (78.4 kg)  07/26/17 170 lb (77.1 kg)  07/12/17 180 lb (81.6 kg)    Physical Exam  Results for orders placed or performed during the hospital encounter of 06/27/17  CBC  Result Value Ref Range   WBC 12.2 (H) 4.0 - 10.5 K/uL   RBC 3.60 (L) 3.87 - 5.11 MIL/uL   Hemoglobin 10.6 (L) 12.0 - 15.0 g/dL   HCT 35.3 (L)  36.0 - 46.0 %   MCV 98.1 78.0 - 100.0 fL   MCH 29.4 26.0 - 34.0 pg   MCHC 30.0 30.0 - 36.0 g/dL   RDW 14.5 11.5 - 15.5 %   Platelets 197 150 - 400 K/uL  Basic metabolic panel  Result Value Ref Range   Sodium 137 135 - 145 mmol/L   Potassium 4.3 3.5 - 5.1 mmol/L   Chloride 103 101 - 111 mmol/L   CO2 30 22 - 32 mmol/L   Glucose, Bld 141 (H) 65 - 99 mg/dL   BUN 9 6 - 20 mg/dL   Creatinine, Ser 0.57 0.44 - 1.00 mg/dL   Calcium 8.2 (L) 8.9 - 10.3 mg/dL   GFR calc non Af Amer >60 >60 mL/min   GFR calc Af Amer >60 >60 mL/min   Anion gap 4 (L) 5 - 15  CBC  Result Value Ref Range   WBC 11.8 (H) 4.0 - 10.5 K/uL   RBC 3.53 (L) 3.87 - 5.11 MIL/uL   Hemoglobin 10.5 (L) 12.0 - 15.0 g/dL   HCT 34.4 (L) 36.0 - 46.0 %   MCV 97.5 78.0 - 100.0 fL   MCH 29.7 26.0 - 34.0 pg   MCHC 30.5 30.0 - 36.0 g/dL   RDW 15.0 11.5 - 15.5 %   Platelets 198 150 - 400 K/uL  Basic metabolic panel    Result Value Ref Range   Sodium 138 135 - 145 mmol/L   Potassium 4.2 3.5 - 5.1 mmol/L   Chloride 99 (L) 101 - 111 mmol/L   CO2 32 22 - 32 mmol/L   Glucose, Bld 101 (H) 65 - 99 mg/dL   BUN 11 6 - 20 mg/dL   Creatinine, Ser 0.61 0.44 - 1.00 mg/dL   Calcium 8.5 (L) 8.9 - 10.3 mg/dL   GFR calc non Af Amer >60 >60 mL/min   GFR calc Af Amer >60 >60 mL/min   Anion gap 7 5 - 15  CBC  Result Value Ref Range   WBC 10.5 4.0 - 10.5 K/uL   RBC 3.47 (L) 3.87 - 5.11 MIL/uL   Hemoglobin 10.2 (L) 12.0 - 15.0 g/dL   HCT 33.7 (L) 36.0 - 46.0 %   MCV 97.1 78.0 - 100.0 fL   MCH 29.4 26.0 - 34.0 pg   MCHC 30.3 30.0 - 36.0 g/dL   RDW 14.7 11.5 - 15.5 %   Platelets 192 150 - 400 K/uL  Basic metabolic panel  Result Value Ref Range   Sodium 138 135 - 145 mmol/L   Potassium 4.0 3.5 - 5.1 mmol/L   Chloride 95 (L) 101 - 111 mmol/L   CO2 33 (H) 22 - 32 mmol/L   Glucose, Bld 92 65 - 99 mg/dL   BUN 8 6 - 20 mg/dL   Creatinine, Ser 0.53 0.44 - 1.00 mg/dL   Calcium 8.8 (L) 8.9 - 10.3 mg/dL   GFR calc non Af Amer >60 >60 mL/min   GFR calc Af Amer >60 >60 mL/min   Anion gap 10 5 - 15      Assessment & Plan:   1. Chronic obstructive pulmonary disease, unspecified COPD type (HCC) Continue Combivent and Advair  2. Primary osteoarthritis of right knee Continue care with Dr. Durward Fortes  3. Primary osteoarthritis of left knee Continue care with Dr. Durward Fortes  Continue all other maintenance medications as listed above.  Follow up plan: Return in about 6 months (around 02/15/2018) for recheck and labs.  Educational handout  given for [place] PA-C [place] [address]  [place], [place] [address] [phone]   08/17/2017, 11:24 AM

## 2017-08-17 NOTE — Therapy (Signed)
[facility] [place], [place], [address] Phone: [phone]   Fax:  [phone]  Physical Therapy Treatment  Patient Details  Name: Joanne Kramer MRN: [id number] Date of Birth: 31-May-1945 Referring Provider: Joni Fears   Encounter Date: 08/17/2017  PT End of Session - 08/17/17 1753    Visit Number  10    Number of Visits  16    Date for PT Re-Evaluation  10/15/17    Authorization Type  FOTO every 5th visit, progress note every 10th visit, KX modifier at 15th visit     Take FOTO ON 11th visit    PT Start Time  1645    PT Stop Time  1745    PT Time Calculation (min)  60 min       Past Medical History:  Diagnosis Date  . Cataract   . COPD (chronic obstructive pulmonary disease) ([place])   . Emphysema of lung ([place])   . Headache   . OA (osteoarthritis) of knee    right  . Pneumonia   . Requires supplemental oxygen     Past Surgical History:  Procedure Laterality Date  . BREAST SURGERY     left breast aspiration  . CESAREAN SECTION    . COLONOSCOPY    . EYE SURGERY    . KNEE SURGERY Right   . TONSILLECTOMY    . TOTAL KNEE ARTHROPLASTY Right 06/27/2017   Procedure: RIGHT TOTAL KNEE ARTHROPLASTY;  Surgeon: Garald Balding, MD;  Location: [facility];  Service: Orthopedics;  Laterality: Right;    There were no vitals filed for this visit.                    [facility] Adult PT Treatment/Exercise - 08/17/17 0001      Knee/Hip Exercises: Aerobic   Stationary Bike  L3 x73min      Knee/Hip Exercises: Machines for Strengthening   Cybex Knee Extension  10# 3x10 reps    Cybex Knee Flexion  30# 3x10 reps      Knee/Hip Exercises: Standing   SLS  x 5       Modalities   Modalities  Electrical Stimulation;Vasopneumatic      Electrical Stimulation   Electrical Stimulation Location  R knee IFC x 15 min 80-150hz     Electrical Stimulation Goals  Pain      Vasopneumatic   Number Minutes Vasopneumatic    15 minutes    Vasopnuematic Location   Knee    Vasopneumatic Pressure  Low    Vasopneumatic Temperature   34      Manual Therapy   Manual Therapy  Myofascial release    Myofascial Release  Gentle IASTW to R superior knee to reduce tightness and pain    Passive ROM  PROM into flexion 118-120 degrees               PT Short Term Goals - 08/01/17 1603      PT SHORT TERM GOAL #1   Title  Patient will demonstrate full right knee extension AROM in order to normalize gait pattern.    Time  2    Period  Weeks    Status  On-going -3 deg from neutral R knee 08/01/2017      PT SHORT TERM GOAL #2   Title  Patient will demonstrate 90+ degrees of right knee flexion AROM to impove ability to perform ADLs and functional activities.    Time  2  Period  Weeks    Status  Achieved        PT Long Term Goals - 08/01/17 1603      PT LONG TERM GOAL #1   Title  Patient will be independent with advanced HEP.    Time  8    Period  Weeks    Status  On-going      PT LONG TERM GOAL #2   Title  Patient will demonstrate 115 degreess or greater right knee flexion AROM to improve ability to perform ADLs and functional tasks.    Time  8    Period  Weeks    Status  On-going AROM R knee 111 deg flexion 08/01/2017      PT LONG TERM GOAL #3   Title  Patient will demonstrate reciprocal step over step stair negotiation utilizing both railings in order to safely enter and exit home.    Time  8    Period  Weeks    Status  On-going      PT LONG TERM GOAL #4   Title  Patient will ambulate community distances with least restrictive AD and less than 3/10 right knee pain.    Time  8    Period  Weeks    Status  On-going      PT LONG TERM GOAL #5   Title  Patient will demonstrate 4+/5 or greater R knee MMT in all planes for stability during gait and functional activities.    Time  8    Period  Weeks    Status  On-going            Plan - 08/17/17 1746    Clinical Impression Statement  Pt  arrived today doing fairly well with minimal RT knee pain. She was able to complete therex today with increased resistance with mainly fatigue. PROM today in sitting 118-120 degrees end of Rx with soft end-feel. Normal modality response.    Rehab Potential  Excellent    PT Frequency  2x / week    PT Duration  8 weeks    PT Treatment/Interventions  ADLs/Self Care Home Management;Moist Heat;Cryotherapy;Occupational psychologist;Therapeutic activities;Functional mobility training;Therapeutic exercise;Balance training;Patient/family education;Neuromuscular re-education;Scar mobilization;Vasopneumatic Device;Taping;Dry needling;Passive range of motion;Manual techniques    PT Next Visit Plan  Nustep, AROM/PROM, modalities PRN for pain relief.    PT Home Exercise Plan  cont exercises provided by home physical therapist    Consulted and Agree with Plan of Care  Patient       Patient will benefit from skilled therapeutic intervention in order to improve the following deficits and impairments:  Pain, Decreased activity tolerance, Decreased range of motion, Decreased strength, Difficulty walking, Increased edema, Decreased balance  Visit Diagnosis: Chronic pain of right knee  Stiffness of right knee, not elsewhere classified  Difficulty in walking, not elsewhere classified     Problem List Patient Active Problem List   Diagnosis Date Noted  . 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 Total knee replacement status, right 06/27/2017  . Primary osteoarthritis of right knee 02/15/2017  . Primary osteoarthritis of left knee 02/15/2017  . 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 Nonintractable episodic headache 02/15/2017  . Chronic obstructive pulmonary disease ([place]) 11/08/2016  . Pure hypercholesterolemia 11/08/2016  . Hypoxia 11/08/2016  . Tobacco abuse 11/08/2016    Rosealyn Little,[name], PTA 08/17/2017, 5:56 PM  [facility] [address] [place], [place], [address] Phone: [phone]  Fax:  [phone]  Name: Joanne Kramer MRN: [id number] Date of Birth: [birth year]

## 2017-08-22 ENCOUNTER — Encounter: Payer: Self-pay | Admitting: Physical Therapy

## 2017-08-22 ENCOUNTER — Ambulatory Visit: Payer: Medicare Other | Admitting: Physical Therapy

## 2017-08-22 DIAGNOSIS — G8929 Other chronic pain: Secondary | ICD-10-CM

## 2017-08-22 DIAGNOSIS — M25661 Stiffness of right knee, not elsewhere classified: Secondary | ICD-10-CM | POA: Diagnosis not present

## 2017-08-22 DIAGNOSIS — R262 Difficulty in walking, not elsewhere classified: Secondary | ICD-10-CM

## 2017-08-22 DIAGNOSIS — M25561 Pain in right knee: Principal | ICD-10-CM

## 2017-08-22 NOTE — Therapy (Signed)
[facility] [place], [place], [address] Phone: [phone]   Fax:  [phone]  Physical Therapy Treatment  Patient Details  Name: Joanne Kramer MRN: [id number] Date of Birth: 1945-09-22 Referring Provider: Joni Fears   Encounter Date: 08/22/2017  PT End of Session - 08/22/17 1655    Visit Number  11    Number of Visits  16    Date for PT Re-Evaluation  10/15/17    Authorization Type  FOTO every 5th visit, progress note every 10th visit, KX modifier at 15th visit     Take FOTO ON 11th visit    PT Start Time  1645    PT Stop Time  1750    PT Time Calculation (min)  65 min    Activity Tolerance  Patient tolerated treatment well    Behavior During Therapy  [facility] for tasks assessed/performed       Past Medical History:  Diagnosis Date  . Cataract   . COPD (chronic obstructive pulmonary disease) ([place])   . Emphysema of lung ([place])   . Headache   . OA (osteoarthritis) of knee    right  . Pneumonia   . Requires supplemental oxygen     Past Surgical History:  Procedure Laterality Date  . BREAST SURGERY     left breast aspiration  . CESAREAN SECTION    . COLONOSCOPY    . EYE SURGERY    . KNEE SURGERY Right   . TONSILLECTOMY    . TOTAL KNEE ARTHROPLASTY Right 06/27/2017   Procedure: RIGHT TOTAL KNEE ARTHROPLASTY;  Surgeon: Garald Balding, MD;  Location: [facility];  Service: Orthopedics;  Laterality: Right;    There were no vitals filed for this visit.  Subjective Assessment - 08/22/17 1648    Subjective  Patient reports knee is feeling okay, 1/10    Pertinent History  R TKA 06/27/17; COPD    Limitations  House hold activities;Standing;Walking    Currently in Pain?  Yes    Pain Score  1     Pain Orientation  Right    Pain Descriptors / Indicators  Sore    Pain Type  Surgical pain    Pain Onset  More than a month ago    Pain Frequency  Intermittent         [facility] PT Assessment - 08/22/17 0001      Assessment   Medical Diagnosis  Right TKA    Onset Date/Surgical Date  06/27/17    Next MD Visit  09/05/2017    Prior Therapy  yes                   [facility] Adult PT Treatment/Exercise - 08/22/17 0001      Knee/Hip Exercises: Aerobic   Stationary Bike  L3 x17 min      Knee/Hip Exercises: Machines for Strengthening   Cybex Knee Extension  10# 1x10 reps; 20# 2x10 reps    Cybex Knee Flexion  30# 3x10 reps      Knee/Hip Exercises: Standing   Functional Squat  1 set;10 reps quarter squat      Modalities   Modalities  Electrical Stimulation;Vasopneumatic      Electrical Stimulation   Electrical Stimulation Location  R knee    Electrical Stimulation Action  IFC    Electrical Stimulation Parameters  80-150 hz x15 min    Electrical Stimulation Goals  Pain      Vasopneumatic   Number Minutes Vasopneumatic  15 minutes    Vasopnuematic Location   Knee    Vasopneumatic Pressure  Low    Vasopneumatic Temperature   34      Manual Therapy   Manual Therapy  Myofascial release    Myofascial Release  Gentle IASTW to R superior knee to reduce tightness and pain               PT Short Term Goals - 08/01/17 1603      PT SHORT TERM GOAL #1   Title  Patient will demonstrate full right knee extension AROM in order to normalize gait pattern.    Time  2    Period  Weeks    Status  On-going -3 deg from neutral R knee 08/01/2017      PT SHORT TERM GOAL #2   Title  Patient will demonstrate 90+ degrees of right knee flexion AROM to impove ability to perform ADLs and functional activities.    Time  2    Period  Weeks    Status  Achieved        PT Long Term Goals - 08/01/17 1603      PT LONG TERM GOAL #1   Title  Patient will be independent with advanced HEP.    Time  8    Period  Weeks    Status  On-going      PT LONG TERM GOAL #2   Title  Patient will demonstrate 115 degreess or greater right knee flexion AROM to improve ability to perform ADLs and functional tasks.     Time  8    Period  Weeks    Status  On-going AROM R knee 111 deg flexion 08/01/2017      PT LONG TERM GOAL #3   Title  Patient will demonstrate reciprocal step over step stair negotiation utilizing both railings in order to safely enter and exit home.    Time  8    Period  Weeks    Status  On-going      PT LONG TERM GOAL #4   Title  Patient will ambulate community distances with least restrictive AD and less than 3/10 right knee pain.    Time  8    Period  Weeks    Status  On-going      PT LONG TERM GOAL #5   Title  Patient will demonstrate 4+/5 or greater R knee MMT in all planes for stability during gait and functional activities.    Time  8    Period  Weeks    Status  On-going            Plan - 08/22/17 1756    Clinical Impression Statement  Patient was able to tolerate treatment well with minimal reports of knee pain. Patient noted with postero-medial right knee pain with mini squats. Patient instructed to not squat down too low and patient reported pain dissipated. A small nodule was noted upon palpation. Patient noted pain is infrequent and does not limit any activity. Normal response to modalities upon removal.     Clinical Presentation  Stable    Clinical Decision Making  Low    Rehab Potential  Excellent    PT Frequency  2x / week    PT Duration  8 weeks    PT Treatment/Interventions  ADLs/Self Care Home Management;Moist Heat;Cryotherapy;Occupational psychologist;Therapeutic activities;Functional mobility training;Therapeutic exercise;Balance training;Patient/family education;Neuromuscular re-education;Scar mobilization;Vasopneumatic Device;Taping;Dry needling;Passive range of motion;Manual techniques    PT Next  Visit Plan  Cont knee strengthening with focus on steps and eccentric control; modalities PRN for pain relief    Consulted and Agree with Plan of Care  Patient       Patient will benefit from skilled therapeutic intervention in  order to improve the following deficits and impairments:  Pain, Decreased activity tolerance, Decreased range of motion, Decreased strength, Difficulty walking, Increased edema, Decreased balance  Visit Diagnosis: Chronic pain of right knee  Stiffness of right knee, not elsewhere classified  Difficulty in walking, not elsewhere classified     Problem List Patient Active Problem List   Diagnosis Date Noted  . 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 Total knee replacement status, right 06/27/2017  . Primary osteoarthritis of right knee 02/15/2017  . Primary osteoarthritis of left knee 02/15/2017  . 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 Nonintractable episodic headache 02/15/2017  . Chronic obstructive pulmonary disease ([place]) 11/08/2016  . Pure hypercholesterolemia 11/08/2016  . Hypoxia 11/08/2016  . Tobacco abuse 11/08/2016    Gabriela Eves, PT, DPT 08/22/2017, 6:16 PM  [facility] [address] [place], [place], [address] Phone: [phone]   Fax:  [phone]  Name: Joanne Kramer MRN: [id number] Date of Birth: [birth year]

## 2017-08-24 ENCOUNTER — Ambulatory Visit: Payer: Medicare Other | Admitting: Physical Therapy

## 2017-08-24 DIAGNOSIS — M25561 Pain in right knee: Secondary | ICD-10-CM | POA: Diagnosis not present

## 2017-08-24 DIAGNOSIS — M25661 Stiffness of right knee, not elsewhere classified: Secondary | ICD-10-CM

## 2017-08-24 DIAGNOSIS — G8929 Other chronic pain: Secondary | ICD-10-CM | POA: Diagnosis not present

## 2017-08-24 DIAGNOSIS — R262 Difficulty in walking, not elsewhere classified: Secondary | ICD-10-CM | POA: Diagnosis not present

## 2017-08-24 NOTE — Therapy (Addendum)
[facility] [place], [place], [address] Phone: [phone]   Fax:  [phone]  Physical Therapy Treatment  Patient Details  Name: Joanne Kramer MRN: [id number] Date of Birth: 1945/07/23 Referring Provider: Joni Fears   Encounter Date: 08/24/2017  PT End of Session - 08/24/17 1654    Visit Number  12    Number of Visits  16    Date for PT Re-Evaluation  10/15/17    Authorization Type  FOTO every 5th visit, progress note every 10th visit, KX modifier at 15th visit     Take FOTO ON 11th visit    PT Start Time  1645    PT Stop Time  1748    PT Time Calculation (min)  63 min    Activity Tolerance  Patient tolerated treatment well    Behavior During Therapy  [facility] for tasks assessed/performed       Past Medical History:  Diagnosis Date  . Cataract   . COPD (chronic obstructive pulmonary disease) ([place])   . Emphysema of lung ([place])   . Headache   . OA (osteoarthritis) of knee    right  . Pneumonia   . Requires supplemental oxygen     Past Surgical History:  Procedure Laterality Date  . BREAST SURGERY     left breast aspiration  . CESAREAN SECTION    . COLONOSCOPY    . EYE SURGERY    . KNEE SURGERY Right   . TONSILLECTOMY    . TOTAL KNEE ARTHROPLASTY Right 06/27/2017   Procedure: RIGHT TOTAL KNEE ARTHROPLASTY;  Surgeon: Garald Balding, MD;  Location: [place];  Service: Orthopedics;  Laterality: Right;    There were no vitals filed for this visit.  Subjective Assessment - 08/24/17 1811    Subjective  "Knee was doing fine until I hit it on a grocery cart."    Pertinent History  R TKA 06/27/17; COPD    Limitations  House hold activities;Standing;Walking    Currently in Pain?  Yes    Pain Score  2     Pain Location  Knee    Pain Orientation  Right    Pain Descriptors / Indicators  Sore    Pain Type  Surgical pain    Pain Onset  More than a month ago    Pain Frequency  Intermittent         [facility] PT  Assessment - 08/24/17 0001      Assessment   Medical Diagnosis  Right TKA    Onset Date/Surgical Date  06/27/17    Next MD Visit  09/05/2017    Prior Therapy  yes      Observation/Other Assessments   Focus on Therapeutic Outcomes (FOTO)   51% limited                   [facility] Adult PT Treatment/Exercise - 08/24/17 0001      Knee/Hip Exercises: Aerobic   Stationary Bike  L3 x15 min      Knee/Hip Exercises: Machines for Strengthening   Cybex Knee Extension  20# 3x10 reps    Cybex Knee Flexion  30# 3x10 reps      Knee/Hip Exercises: Standing   Step Down  3 sets;5 reps;Hand Hold: 2;Step Height: 4";Right      Modalities   Modalities  Psychologist, educational Location  R knee    Electrical Stimulation Action  IFC  Electrical Stimulation Parameters  80-150 hz x15 min    Electrical Stimulation Goals  Pain      Vasopneumatic   Number Minutes Vasopneumatic   15 minutes    Vasopnuematic Location   Knee    Vasopneumatic Pressure  Low    Vasopneumatic Temperature   34      Manual Therapy   Manual Therapy  Myofascial release    Myofascial Release  Gentle IASTW to R superior knee ITB and and hamstrings to reduce tightness and pain               PT Short Term Goals - 08/01/17 1603      PT SHORT TERM GOAL #1   Title  Patient will demonstrate full right knee extension AROM in order to normalize gait pattern.    Time  2    Period  Weeks    Status  On-going -3 deg from neutral R knee 08/01/2017      PT SHORT TERM GOAL #2   Title  Patient will demonstrate 90+ degrees of right knee flexion AROM to impove ability to perform ADLs and functional activities.    Time  2    Period  Weeks    Status  Achieved        PT Long Term Goals - 08/01/17 1603      PT LONG TERM GOAL #1   Title  Patient will be independent with advanced HEP.    Time  8    Period  Weeks    Status  On-going      PT LONG TERM  GOAL #2   Title  Patient will demonstrate 115 degreess or greater right knee flexion AROM to improve ability to perform ADLs and functional tasks.    Time  8    Period  Weeks    Status  On-going AROM R knee 111 deg flexion 08/01/2017      PT LONG TERM GOAL #3   Title  Patient will demonstrate reciprocal step over step stair negotiation utilizing both railings in order to safely enter and exit home.    Time  8    Period  Weeks    Status  On-going      PT LONG TERM GOAL #4   Title  Patient will ambulate community distances with least restrictive AD and less than 3/10 right knee pain.    Time  8    Period  Weeks    Status  On-going      PT LONG TERM GOAL #5   Title  Patient will demonstrate 4+/5 or greater R knee MMT in all planes for stability during gait and functional activities.    Time  8    Period  Weeks    Status  On-going            Plan - 08/24/17 1806    Clinical Impression Statement  Patient was able to tolerate treatment well. Patient reported some discomfort in the knee during eccentric step downs but was able to complete the exercise. Patient demonstrated good form with minimal hip adduction compensation. Patient was able to tolerate gentle IASTM to posterior knee/hamstring tendons. Normal response to modalities upon removal.    Clinical Presentation  Stable    Clinical Decision Making  Low    Rehab Potential  Excellent    PT Frequency  2x / week    PT Duration  8 weeks    PT Next Visit Plan  Cont knee  strengthening with focus on steps and eccentric control; modalities PRN for pain relief    Consulted and Agree with Plan of Care  Patient       Patient will benefit from skilled therapeutic intervention in order to improve the following deficits and impairments:  Pain, Decreased activity tolerance, Decreased range of motion, Decreased strength, Difficulty walking, Increased edema, Decreased balance  Visit Diagnosis: Chronic pain of right knee  Stiffness of right  knee, not elsewhere classified  Difficulty in walking, not elsewhere classified     Problem List Patient Active Problem List   Diagnosis Date Noted  . 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 Total knee replacement status, right 06/27/2017  . Primary osteoarthritis of right knee 02/15/2017  . Primary osteoarthritis of left knee 02/15/2017  . 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 Nonintractable episodic headache 02/15/2017  . Chronic obstructive pulmonary disease ([place]) 11/08/2016  . Pure hypercholesterolemia 11/08/2016  . Hypoxia 11/08/2016  . Tobacco abuse 11/08/2016   Gabriela Eves, PT, DPT 08/24/2017, 6:12 PM  [facility] [address] [place], [place], [address] Phone: [phone]   Fax:  [phone]  Name: Adaisha Campise MRN: [id number] Date of Birth: 09/29/1945

## 2017-08-25 ENCOUNTER — Other Ambulatory Visit: Payer: Self-pay | Admitting: Radiology

## 2017-08-25 ENCOUNTER — Telehealth (INDEPENDENT_AMBULATORY_CARE_PROVIDER_SITE_OTHER): Payer: Self-pay | Admitting: Orthopaedic Surgery

## 2017-08-25 MED ORDER — AMOXICILLIN 500 MG PO CAPS: ORAL_CAPSULE | ORAL | 0 refills | Status: DC

## 2017-08-25 NOTE — Telephone Encounter (Signed)
Patient called stating she is going to the dentist on Wednesday, 08/30/17 and because she had TKR on 06/27/17 he recommended that she have Dr. Durward Fortes call in antibiotics to take before the appointment.  Patient's pharmacy is [facility] [address] in [place].

## 2017-08-25 NOTE — Telephone Encounter (Signed)
Sent rx into pharmacy and left message for pt that meds were called in.

## 2017-08-27 DIAGNOSIS — J449 Chronic obstructive pulmonary disease, unspecified: Secondary | ICD-10-CM | POA: Diagnosis not present

## 2017-08-29 ENCOUNTER — Ambulatory Visit: Payer: Medicare Other | Attending: Orthopaedic Surgery | Admitting: Physical Therapy

## 2017-08-29 ENCOUNTER — Encounter: Payer: Self-pay | Admitting: Physical Therapy

## 2017-08-29 DIAGNOSIS — M25561 Pain in right knee: Secondary | ICD-10-CM | POA: Diagnosis not present

## 2017-08-29 DIAGNOSIS — M25661 Stiffness of right knee, not elsewhere classified: Secondary | ICD-10-CM | POA: Diagnosis not present

## 2017-08-29 DIAGNOSIS — R262 Difficulty in walking, not elsewhere classified: Secondary | ICD-10-CM | POA: Insufficient documentation

## 2017-08-29 DIAGNOSIS — G8929 Other chronic pain: Secondary | ICD-10-CM

## 2017-08-29 NOTE — Therapy (Signed)
[facility] [place], [place], [address] Phone: [phone]   Fax:  [phone]  Physical Therapy Treatment  Patient Details  Name: Joanne Kramer MRN: [id number] Date of Birth: Apr 15, 1945 Referring Provider: Joni Fears   Encounter Date: 08/29/2017  PT End of Session - 08/29/17 1648    Visit Number  13    Number of Visits  16    Authorization Type  FOTO every 5th visit, progress note every 10th visit, KX modifier at 15th visit     Take FOTO ON 11th visit    PT Start Time  1645    PT Stop Time  1750    PT Time Calculation (min)  65 min    Activity Tolerance  Patient tolerated treatment well    Behavior During Therapy  [facility] for tasks assessed/performed       Past Medical History:  Diagnosis Date  . Cataract   . COPD (chronic obstructive pulmonary disease) ([place])   . Emphysema of lung ([place])   . Headache   . OA (osteoarthritis) of knee    right  . Pneumonia   . Requires supplemental oxygen     Past Surgical History:  Procedure Laterality Date  . BREAST SURGERY     left breast aspiration  . CESAREAN SECTION    . COLONOSCOPY    . EYE SURGERY    . KNEE SURGERY Right   . TONSILLECTOMY    . TOTAL KNEE ARTHROPLASTY Right 06/27/2017   Procedure: RIGHT TOTAL KNEE ARTHROPLASTY;  Surgeon: Garald Balding, MD;  Location: [facility];  Service: Orthopedics;  Laterality: Right;    There were no vitals filed for this visit.  Subjective Assessment - 08/29/17 1647    Subjective  Patient reported feeling good.     Pertinent History  R TKA 06/27/17; COPD    Limitations  House hold activities;Standing;Walking    Currently in Pain?  No/denies         [facility] PT Assessment - 08/29/17 0001      Assessment   Medical Diagnosis  Right TKA    Onset Date/Surgical Date  06/27/17    Next MD Visit  09/05/2017    Prior Therapy  yes      AROM   Right Knee Extension  -2    Right Knee Flexion  117                    [facility] Adult PT Treatment/Exercise - 08/29/17 0001      Knee/Hip Exercises: Aerobic   Stationary Bike  L3 x15 min      Knee/Hip Exercises: Machines for Strengthening   Cybex Leg Press  1 plate 2x10      Knee/Hip Exercises: Standing   Terminal Knee Extension  Strengthening;Right;3 sets;20 reps;Theraband;Limitations    Theraband Level (Terminal Knee Extension)  --    Terminal Knee Extension Limitations  Pink XTS    Other Standing Knee Exercises  lateral band walking with red theraband x5 reps at counter      Modalities   Modalities  Psychologist, educational Location  R knee    Electrical Stimulation Action  IFC    Electrical Stimulation Parameters  80-150 hz x15 min    Electrical Stimulation Goals  Pain      Vasopneumatic   Number Minutes Vasopneumatic   15 minutes    Vasopnuematic Location   Knee    Vasopneumatic  Pressure  Low    Vasopneumatic Temperature   34      Manual Therapy   Manual Therapy  Passive ROM    Passive ROM  right knee PROM with over pressure to improve extension and flexion               PT Short Term Goals - 08/29/17 1744      PT SHORT TERM GOAL #1   Title  Patient will demonstrate full right knee extension AROM in order to normalize gait pattern.    Period  Weeks    Status  Partially Met 2 degrees from 0      PT SHORT TERM GOAL #2   Title  Patient will demonstrate 90+ degrees of right knee flexion AROM to impove ability to perform ADLs and functional activities.    Time  2    Period  Weeks    Status  Achieved        PT Long Term Goals - 08/29/17 1744      PT LONG TERM GOAL #1   Title  Patient will be independent with advanced HEP.    Time  8    Period  Weeks    Status  Achieved      PT LONG TERM GOAL #2   Title  Patient will demonstrate 115 degreess or greater right knee flexion AROM to improve ability to perform ADLs and functional tasks.     Time  8    Period  Weeks    Status  Achieved 117      PT LONG TERM GOAL #3   Title  Patient will demonstrate reciprocal step over step stair negotiation utilizing both railings in order to safely enter and exit home.    Time  8    Period  Weeks    Status  On-going      PT LONG TERM GOAL #4   Title  Patient will ambulate community distances with least restrictive AD and less than 3/10 right knee pain.    Time  8    Period  Weeks    Status  On-going      PT LONG TERM GOAL #5   Title  Patient will demonstrate 4+/5 or greater R knee MMT in all planes for stability during gait and functional activities.    Time  8    Period  Weeks    Status  On-going            Plan - 08/29/17 1742    Clinical Impression Statement  Patient was able to tolerate new exercies well with minimal reports of pain. Patient required verbal cue for form for lateral stepping and was able to demonstrate proper form after cuing. Patient's AROM 3-117. Normal response to modalities upon end of session.     Clinical Presentation  Stable    Clinical Decision Making  Low    Rehab Potential  Excellent    PT Frequency  2x / week    PT Duration  8 weeks    PT Treatment/Interventions  ADLs/Self Care Home Management;Moist Heat;Cryotherapy;Occupational psychologist;Therapeutic activities;Functional mobility training;Therapeutic exercise;Balance training;Patient/family education;Neuromuscular re-education;Scar mobilization;Vasopneumatic Device;Taping;Dry needling;Passive range of motion;Manual techniques    PT Next Visit Plan  Cont knee strengthening with focus on steps and eccentric control; modalities PRN for pain relief    Consulted and Agree with Plan of Care  Patient       Patient will benefit from skilled therapeutic intervention in order to  improve the following deficits and impairments:  Pain, Decreased activity tolerance, Decreased range of motion, Decreased strength, Difficulty  walking, Increased edema, Decreased balance  Visit Diagnosis: Chronic pain of right knee  Stiffness of right knee, not elsewhere classified  Difficulty in walking, not elsewhere classified     Problem List Patient Active Problem List   Diagnosis Date Noted  . 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 Total knee replacement status, right 06/27/2017  . Primary osteoarthritis of right knee 02/15/2017  . Primary osteoarthritis of left knee 02/15/2017  . 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 Nonintractable episodic headache 02/15/2017  . Chronic obstructive pulmonary disease ([place]) 11/08/2016  . Pure hypercholesterolemia 11/08/2016  . Hypoxia 11/08/2016  . Tobacco abuse 11/08/2016    Gabriela Eves, PT, DPT 08/29/2017, 5:55 PM  [facility] [place], [place], [address] Phone: [phone]   Fax:  [phone]  Name: Joanne Kramer MRN: [id number] Date of Birth: 04/30/1945

## 2017-09-05 ENCOUNTER — Ambulatory Visit: Payer: Medicare Other | Admitting: Physical Therapy

## 2017-09-05 DIAGNOSIS — M25661 Stiffness of right knee, not elsewhere classified: Secondary | ICD-10-CM

## 2017-09-05 DIAGNOSIS — M25561 Pain in right knee: Secondary | ICD-10-CM | POA: Diagnosis not present

## 2017-09-05 DIAGNOSIS — G8929 Other chronic pain: Secondary | ICD-10-CM | POA: Diagnosis not present

## 2017-09-05 DIAGNOSIS — R262 Difficulty in walking, not elsewhere classified: Secondary | ICD-10-CM

## 2017-09-05 NOTE — Therapy (Signed)
[facility] [place], [place], [address] Phone: [phone]   Fax:  [phone]  Physical Therapy Treatment  Patient Details  Name: Joanne Kramer MRN: [id number] Date of Birth: [birth year] Referring Provider: Joni Fears   Encounter Date: 09/05/2017  PT End of Session - 09/05/17 1656    Visit Number  14    Number of Visits  16    Date for PT Re-Evaluation  10/15/17    Authorization Type  FOTO every 5th visit, progress note every 10th visit, KX modifier at 15th visit     Take FOTO ON 11th visit    PT Start Time  1645    PT Stop Time  1752    PT Time Calculation (min)  67 min    Activity Tolerance  Patient tolerated treatment well    Behavior During Therapy  [facility] for tasks assessed/performed       Past Medical History:  Diagnosis Date  . Cataract   . COPD (chronic obstructive pulmonary disease) ([place])   . Emphysema of lung ([place])   . Headache   . OA (osteoarthritis) of knee    right  . Pneumonia   . Requires supplemental oxygen     Past Surgical History:  Procedure Laterality Date  . BREAST SURGERY     left breast aspiration  . CESAREAN SECTION    . COLONOSCOPY    . EYE SURGERY    . KNEE SURGERY Right   . TONSILLECTOMY    . TOTAL KNEE ARTHROPLASTY Right 06/27/2017   Procedure: RIGHT TOTAL KNEE ARTHROPLASTY;  Surgeon: Garald Balding, MD;  Location: [facility];  Service: Orthopedics;  Laterality: Right;    There were no vitals filed for this visit.  Subjective Assessment - 09/05/17 1653    Subjective  Patient reported knee is feeling a little sore after vacuuming. She reports 3 sore spots within her knee.     Pertinent History  R TKA 06/27/17; COPD    Limitations  House hold activities;Standing;Walking    Currently in Pain?  Yes    Pain Score  2     Pain Location  Knee    Pain Orientation  Right    Pain Descriptors / Indicators  Sore    Pain Type  Surgical pain    Pain Onset  More than a month ago          [facility] PT Assessment - 09/05/17 0001      Assessment   Medical Diagnosis  Right TKA    Onset Date/Surgical Date  06/27/17    Next MD Visit  09/05/2017    Prior Therapy  yes      AROM   Right Knee Extension  -2    Right Knee Flexion  118                   [facility] Adult PT Treatment/Exercise - 09/05/17 0001      Knee/Hip Exercises: Aerobic   Stationary Bike  L3 x15 min      Knee/Hip Exercises: Machines for Strengthening   Cybex Knee Extension  20# 3x10 reps    Cybex Knee Flexion  30# 3x10 reps    Cybex Leg Press  2 plates 2x10      Knee/Hip Exercises: Standing   SLS  3x30 seconds      Modalities   Modalities  Electrical Stimulation;Vasopneumatic      Electrical Stimulation   Electrical Stimulation Location  R knee  Electrical Stimulation Action  IFC    Electrical Stimulation Parameters  80-150 hz x15 min    Electrical Stimulation Goals  Pain      Vasopneumatic   Number Minutes Vasopneumatic   15 minutes    Vasopnuematic Location   Knee    Vasopneumatic Pressure  Low    Vasopneumatic Temperature   34      Manual Therapy   Manual Therapy  Passive ROM    Passive ROM  right knee PROM with over pressure to improve extension and flexion               PT Short Term Goals - 08/29/17 1744      PT SHORT TERM GOAL #1   Title  Patient will demonstrate full right knee extension AROM in order to normalize gait pattern.    Period  Weeks    Status  Partially Met 2 degrees from 0      PT SHORT TERM GOAL #2   Title  Patient will demonstrate 90+ degrees of right knee flexion AROM to impove ability to perform ADLs and functional activities.    Time  2    Period  Weeks    Status  Achieved        PT Long Term Goals - 08/29/17 1744      PT LONG TERM GOAL #1   Title  Patient will be independent with advanced HEP.    Time  8    Period  Weeks    Status  Achieved      PT LONG TERM GOAL #2   Title  Patient will demonstrate 115 degreess or greater  right knee flexion AROM to improve ability to perform ADLs and functional tasks.    Time  8    Period  Weeks    Status  Achieved 117      PT LONG TERM GOAL #3   Title  Patient will demonstrate reciprocal step over step stair negotiation utilizing both railings in order to safely enter and exit home.    Time  8    Period  Weeks    Status  On-going      PT LONG TERM GOAL #4   Title  Patient will ambulate community distances with least restrictive AD and less than 3/10 right knee pain.    Time  8    Period  Weeks    Status  On-going      PT LONG TERM GOAL #5   Title  Patient will demonstrate 4+/5 or greater R knee MMT in all planes for stability during gait and functional activities.    Time  8    Period  Weeks    Status  On-going            Plan - 09/05/17 1740    Clinical Impression Statement  Patient was able to complete exercises despite reports of soreness and muscle fatigue. Patient noted with good static balance as noted by the ability to perform R SLS for 30 seconds with minimals sway and no UE support. AROM 2-118 degrees. Normal response to modalities upon removal.     Clinical Presentation  Stable    Clinical Decision Making  Low    Rehab Potential  Excellent    PT Frequency  2x / week    PT Duration  8 weeks    PT Treatment/Interventions  ADLs/Self Care Home Management;Moist Heat;Cryotherapy;Occupational psychologist;Therapeutic activities;Functional mobility training;Therapeutic exercise;Balance training;Patient/family education;Neuromuscular re-education;Scar mobilization;Vasopneumatic Device;Taping;Dry  needling;Passive range of motion;Manual techniques    PT Next Visit Plan  Cont knee strengthening with focus on steps and eccentric control; modalities PRN for pain relief    Consulted and Agree with Plan of Care  Patient       Patient will benefit from skilled therapeutic intervention in order to improve the following deficits and  impairments:  Pain, Decreased activity tolerance, Decreased range of motion, Decreased strength, Difficulty walking, Increased edema, Decreased balance  Visit Diagnosis: Chronic pain of right knee  Stiffness of right knee, not elsewhere classified  Difficulty in walking, not elsewhere classified     Problem List Patient Active Problem List   Diagnosis Date Noted  . 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 Total knee replacement status, right 06/27/2017  . Primary osteoarthritis of right knee 02/15/2017  . Primary osteoarthritis of left knee 02/15/2017  . 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 Nonintractable episodic headache 02/15/2017  . Chronic obstructive pulmonary disease ([place]) 11/08/2016  . Pure hypercholesterolemia 11/08/2016  . Hypoxia 11/08/2016  . Tobacco abuse 11/08/2016   Gabriela Eves, PT, DPT 09/05/2017, 5:59 PM  [facility] [address] [place], [place], [address] Phone: [phone]   Fax:  [phone]  Name: Joanne Kramer MRN: [id number] Date of Birth: 1945-06-08

## 2017-09-06 ENCOUNTER — Ambulatory Visit (INDEPENDENT_AMBULATORY_CARE_PROVIDER_SITE_OTHER): Payer: Medicare Other | Admitting: Orthopaedic Surgery

## 2017-09-06 ENCOUNTER — Encounter (INDEPENDENT_AMBULATORY_CARE_PROVIDER_SITE_OTHER): Payer: Self-pay | Admitting: Orthopaedic Surgery

## 2017-09-06 VITALS — Ht 66.0 in | Wt 172.0 lb

## 2017-09-06 DIAGNOSIS — Z96651 Presence of right artificial knee joint: Secondary | ICD-10-CM

## 2017-09-06 NOTE — Progress Notes (Signed)
Office Visit Note   Patient: Joanne Kramer           Date of Birth: 1946-01-02           MRN: [id number] Visit Date: 09/06/2017              Requested by: Terald Sleeper, PA-C [address] [place], [place] [address] PCP: Terald Sleeper, PA-C   Assessment & Plan: Visit Diagnoses:  1. History of total right knee replacement     Plan: 9 to 10 weeks status post primary right total knee replacement and doing exceptionally well.  Working on home exercises.  No fever or chills.  Excellent range of motion.  We will plan to see her back in 3 months and urged her to continue with her exercises  Follow-Up Instructions: Return in about 3 months (around 12/07/2017).   Orders:  No orders of the defined types were placed in this encounter.  No orders of the defined types were placed in this encounter.     Procedures: No procedures performed   Clinical Data: No additional findings.   Subjective: No chief complaint on file. 9 to 10 weeks status post primary right total knee replacement without any complications.  Very happy with the results.  No fever chills or significant pain.  Not using any ambulatory aid or taking any analgesics  HPI  Review of Systems  Constitutional: Negative for fatigue and fever.  HENT: Negative for ear pain.   Eyes: Negative for pain.  Respiratory: Positive for shortness of breath.   Cardiovascular: Positive for leg swelling.  Gastrointestinal: Negative for constipation and diarrhea.  Genitourinary: Negative for difficulty urinating.  Musculoskeletal: Negative for back pain and neck pain.  Skin: Negative for rash.  Allergic/Immunologic: Negative for food allergies.  Neurological: Positive for weakness. Negative for numbness.  Hematological: Bruises/bleeds easily.  Psychiatric/Behavioral: Negative for sleep disturbance.     Objective: Vital Signs: Ht 5\' 6"  (1.676 m)   Wt 172 lb (78 kg)   BMI 27.76 kg/m   Physical Exam  Ortho Exam week alert  and oriented x3.  Comfortable sitting.  Full extension right knee and up to 120 degrees of flexion in therapy.  No instability.  Incision healing nicely with a little keloid proximally.  No effusion.  No calf pain or distal edema.  Neurovascular exam intact  Specialty Comments:  No specialty comments available.  Imaging: No results found.   PMFS History: Patient Active Problem List   Diagnosis Date Noted  . 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 Total knee replacement status, right 06/27/2017  . Primary osteoarthritis of right knee 02/15/2017  . Primary osteoarthritis of left knee 02/15/2017  . 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 Nonintractable episodic headache 02/15/2017  . Chronic obstructive pulmonary disease ([place]) 11/08/2016  . Pure hypercholesterolemia 11/08/2016  . Hypoxia 11/08/2016  . Tobacco abuse 11/08/2016   Past Medical History:  Diagnosis Date  . Cataract   . COPD (chronic obstructive pulmonary disease) ([place])   . Emphysema of lung ([place])   . Headache   . OA (osteoarthritis) of knee    right  . Pneumonia   . Requires supplemental oxygen     Family History  Adopted: Yes    Past Surgical History:  Procedure Laterality Date  . BREAST SURGERY     left breast aspiration  . CESAREAN SECTION    . COLONOSCOPY    . EYE SURGERY    .  KNEE SURGERY Right   . TONSILLECTOMY    . TOTAL KNEE ARTHROPLASTY Right 06/27/2017   Procedure: RIGHT TOTAL KNEE ARTHROPLASTY;  Surgeon: Garald Balding, MD;  Location: [facility];  Service: Orthopedics;  Laterality: Right;   Social History   Occupational History  . Not on file  Tobacco Use  . Smoking status: Current Some Day Smoker    Packs/day: 1.00    Years: 55.00    Pack years: 55.00    Types: Cigarettes    Last attempt to quit: 05/07/2017    Years since quitting: 0.3  . Smokeless tobacco: Never Used  Substance and Sexual Activity  . Alcohol use: No  . Drug use: No  . Sexual activity: Not  Currently

## 2017-09-07 ENCOUNTER — Encounter: Payer: Self-pay | Admitting: Physical Therapy

## 2017-09-07 ENCOUNTER — Ambulatory Visit: Payer: Medicare Other | Admitting: Physical Therapy

## 2017-09-07 DIAGNOSIS — R262 Difficulty in walking, not elsewhere classified: Secondary | ICD-10-CM | POA: Diagnosis not present

## 2017-09-07 DIAGNOSIS — M25561 Pain in right knee: Principal | ICD-10-CM

## 2017-09-07 DIAGNOSIS — M25661 Stiffness of right knee, not elsewhere classified: Secondary | ICD-10-CM | POA: Diagnosis not present

## 2017-09-07 DIAGNOSIS — G8929 Other chronic pain: Secondary | ICD-10-CM

## 2017-09-07 NOTE — Therapy (Signed)
[facility] [place], [place], [address] Phone: [phone]   Fax:  [phone]  Physical Therapy Treatment  Patient Details  Name: Joanne Kramer MRN: [id number] Date of Birth: Jul 16, 1945 Referring Provider: Joni Fears   Encounter Date: 09/07/2017  PT End of Session - 09/07/17 1642    Visit Number  15    Number of Visits  16    Date for PT Re-Evaluation  10/15/17    Authorization Type  FOTO every 5th visit, progress note every 10th visit, KX modifier at 15th visit     PT Start Time  1636    PT Stop Time  1743    PT Time Calculation (min)  67 min    Activity Tolerance  Patient tolerated treatment well    Behavior During Therapy  [facility] for tasks assessed/performed       Past Medical History:  Diagnosis Date  . Cataract   . COPD (chronic obstructive pulmonary disease) ([place])   . Emphysema of lung ([place])   . Headache   . OA (osteoarthritis) of knee    right  . Pneumonia   . Requires supplemental oxygen     Past Surgical History:  Procedure Laterality Date  . BREAST SURGERY     left breast aspiration  . CESAREAN SECTION    . COLONOSCOPY    . EYE SURGERY    . KNEE SURGERY Right   . TONSILLECTOMY    . TOTAL KNEE ARTHROPLASTY Right 06/27/2017   Procedure: RIGHT TOTAL KNEE ARTHROPLASTY;  Surgeon: Garald Balding, MD;  Location: [facility];  Service: Orthopedics;  Laterality: Right;    There were no vitals filed for this visit.  Subjective Assessment - 09/07/17 1645    Subjective  Patient reports MD's follow up appointment went well.     Pertinent History  R TKA 06/27/17; COPD    Limitations  House hold activities;Standing;Walking    Currently in Pain?  No/denies         [facility] [employer] PT Assessment - 09/07/17 0001      Assessment   Medical Diagnosis  Right TKA    Onset Date/Surgical Date  06/27/17    Next MD Visit  09/05/2017    Prior Therapy  yes                   [facility] Adult PT Treatment/Exercise  - 09/07/17 0001      Ambulation/Gait   Stairs  Yes    Stairs Assistance  6: Modified independent (Device/Increase time)    Stair Management Technique  Two rails    Number of Stairs  1 x2    Height of Stairs  6      Knee/Hip Exercises: Aerobic   Stationary Bike  L3 x15 min      Knee/Hip Exercises: Machines for Strengthening   Cybex Knee Extension  10# eccentric control 3x10 reps    Cybex Knee Flexion  20# eccentric control 3x10 reps    Cybex Leg Press  --      Knee/Hip Exercises: Standing   Other Standing Knee Exercises  lateral band walking with red theraband x3 reps at counter      Modalities   Modalities  Psychologist, educational Location  R knee    Electrical Stimulation Action  IFC    Electrical Stimulation Parameters  80-150 x15 min    Electrical Stimulation Goals  Pain  Vasopneumatic   Number Minutes Vasopneumatic   15 minutes    Vasopnuematic Location   Knee    Vasopneumatic Pressure  Low    Vasopneumatic Temperature   34               PT Short Term Goals - 08/29/17 1744      PT SHORT TERM GOAL #1   Title  Patient will demonstrate full right knee extension AROM in order to normalize gait pattern.    Period  Weeks    Status  Partially Met 2 degrees from 0      PT SHORT TERM GOAL #2   Title  Patient will demonstrate 90+ degrees of right knee flexion AROM to impove ability to perform ADLs and functional activities.    Time  2    Period  Weeks    Status  Achieved        PT Long Term Goals - 08/29/17 1744      PT LONG TERM GOAL #1   Title  Patient will be independent with advanced HEP.    Time  8    Period  Weeks    Status  Achieved      PT LONG TERM GOAL #2   Title  Patient will demonstrate 115 degreess or greater right knee flexion AROM to improve ability to perform ADLs and functional tasks.    Time  8    Period  Weeks    Status  Achieved 117      PT LONG TERM GOAL #3    Title  Patient will demonstrate reciprocal step over step stair negotiation utilizing both railings in order to safely enter and exit home.    Time  8    Period  Weeks    Status  On-going      PT LONG TERM GOAL #4   Title  Patient will ambulate community distances with least restrictive AD and less than 3/10 right knee pain.    Time  8    Period  Weeks    Status  On-going      PT LONG TERM GOAL #5   Title  Patient will demonstrate 4+/5 or greater R knee MMT in all planes for stability during gait and functional activities.    Time  8    Period  Weeks    Status  On-going            Plan - 09/07/17 1804    Clinical Impression Statement  Patient was able to complete progression of exercises with some reports of muscle fatigue and soreness especially on the lateral aspect of the knee. Patient was able to complete all sets with rests. Patient noted with good glute med control during step downs. Patient and PT discussed gym program and HEP to maintain gains of PT. Patient and MD pleased with progress and will be DC'd next visit. Normal response to modalities upon removal.     Clinical Presentation  Stable    Clinical Decision Making  Low    Rehab Potential  Excellent    PT Frequency  2x / week    PT Duration  8 weeks    PT Treatment/Interventions  ADLs/Self Care Home Management;Moist Heat;Cryotherapy;Occupational psychologist;Therapeutic activities;Functional mobility training;Therapeutic exercise;Balance training;Patient/family education;Neuromuscular re-education;Scar mobilization;Vasopneumatic Device;Taping;Dry needling;Passive range of motion;Manual techniques    PT Next Visit Plan  Strengthening exercises, HEP, modalities DC    Consulted and Agree with Plan of Care  Patient  Patient will benefit from skilled therapeutic intervention in order to improve the following deficits and impairments:  Pain, Decreased activity tolerance, Decreased range of  motion, Decreased strength, Difficulty walking, Increased edema, Decreased balance  Visit Diagnosis: Chronic pain of right knee  Stiffness of right knee, not elsewhere classified  Difficulty in walking, not elsewhere classified     Problem List Patient Active Problem List   Diagnosis Date Noted  . 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 Total knee replacement status, right 06/27/2017  . Primary osteoarthritis of right knee 02/15/2017  . Primary osteoarthritis of left knee 02/15/2017  . 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 Nonintractable episodic headache 02/15/2017  . Chronic obstructive pulmonary disease ([place]) 11/08/2016  . Pure hypercholesterolemia 11/08/2016  . Hypoxia 11/08/2016  . Tobacco abuse 11/08/2016    Gabriela Eves, PT, DPT 09/07/2017, 6:18 PM  [facility] [address] [place], [place], [address] Phone: [phone]   Fax:  [phone]  Name: Joanne Kramer MRN: [id number] Date of Birth: 1945-07-06

## 2017-09-12 ENCOUNTER — Ambulatory Visit: Payer: Medicare Other | Admitting: *Deleted

## 2017-09-12 DIAGNOSIS — G8929 Other chronic pain: Secondary | ICD-10-CM | POA: Diagnosis not present

## 2017-09-12 DIAGNOSIS — M25661 Stiffness of right knee, not elsewhere classified: Secondary | ICD-10-CM

## 2017-09-12 DIAGNOSIS — M25561 Pain in right knee: Secondary | ICD-10-CM | POA: Diagnosis not present

## 2017-09-12 DIAGNOSIS — R262 Difficulty in walking, not elsewhere classified: Secondary | ICD-10-CM

## 2017-09-12 NOTE — Therapy (Addendum)
[facility] [place], [place], [address] Phone: [phone]   Fax:  [phone]  Physical Therapy Treatment/Discharge  Patient Details  Name: Joanne Kramer MRN: [id number] Date of Birth: [birth year] Referring Provider: Joni Fears   Encounter Date: 09/12/2017  PT End of Session - 09/12/17 1553    Visit Number  16    Number of Visits  16    Date for PT Re-Evaluation  10/15/17    Authorization Type  FOTO every 5th visit, progress note every 10th visit, KX modifier at 15th visit     PT Start Time  1515    PT Stop Time  1605    PT Time Calculation (min)  50 min       Past Medical History:  Diagnosis Date  . Cataract   . COPD (chronic obstructive pulmonary disease) ([place])   . Emphysema of lung ([place])   . Headache   . OA (osteoarthritis) of knee    right  . Pneumonia   . Requires supplemental oxygen     Past Surgical History:  Procedure Laterality Date  . BREAST SURGERY     left breast aspiration  . CESAREAN SECTION    . COLONOSCOPY    . EYE SURGERY    . KNEE SURGERY Right   . TONSILLECTOMY    . TOTAL KNEE ARTHROPLASTY Right 06/27/2017   Procedure: RIGHT TOTAL KNEE ARTHROPLASTY;  Surgeon: Garald Balding, MD;  Location: [facility];  Service: Orthopedics;  Laterality: Right;    There were no vitals filed for this visit.                    [facility] Adult PT Treatment/Exercise - 09/12/17 0001      Knee/Hip Exercises: Aerobic   Stationary Bike  L3 x15 min      Knee/Hip Exercises: Machines for Strengthening   Cybex Knee Extension  10# eccentric control 3x10 reps    Cybex Knee Flexion  20# eccentric control 3x10 reps      Modalities   Modalities  Electrical Stimulation;Vasopneumatic      Electrical Stimulation   Electrical Stimulation Location  R knee    Electrical Stimulation Action  IFC    Electrical Stimulation Parameters  80-_0 x 15 mins    Electrical Stimulation Goals  Pain      Manual Therapy   Manual Therapy  Passive ROM    Passive ROM  right knee PROM with over pressure to improve extension                PT Short Term Goals - 09/12/17 1545      PT SHORT TERM GOAL #1   Title  Patient will demonstrate full right knee extension AROM in order to normalize gait pattern.    Period  Weeks    Status  Not Met -4 degrees      PT SHORT TERM GOAL #2   Title  Patient will demonstrate 90+ degrees of right knee flexion AROM to impove ability to perform ADLs and functional activities.    Time  2    Period  Weeks    Status  Achieved        PT Long Term Goals - 09/12/17 1613      PT LONG TERM GOAL #1   Title  Patient will be independent with advanced HEP.    Time  8    Period  Weeks      PT LONG TERM GOAL #3  Title  Patient will demonstrate reciprocal step over step stair negotiation utilizing both railings in order to safely enter and exit home.    Time  8    Period  Weeks    Status  Achieved      PT LONG TERM GOAL #4   Title  Patient will ambulate community distances with least restrictive AD and less than 3/10 right knee pain.    Time  8    Period  Weeks    Status  Achieved      PT LONG TERM GOAL #5   Title  Patient will demonstrate 4+/5 or greater R knee MMT in all planes for stability during gait and functional activities.    Time  8    Period  Weeks    Status  Achieved            Plan - 09/12/17 1638    Clinical Impression Statement  Pt arrived today doing fairly well and feels ready to DC from PT. She was able to meet all Goals except Full knee extension. Pt to join gym program to continue progression. Normal modality response. FOTO 38% limited    Clinical Presentation  Stable    Rehab Potential  Excellent    PT Frequency  2x / week    PT Duration  8 weeks    PT Treatment/Interventions  ADLs/Self Care Home Management;Moist Heat;Cryotherapy;Occupational psychologist;Therapeutic activities;Functional  mobility training;Therapeutic exercise;Balance training;Patient/family education;Neuromuscular re-education;Scar mobilization;Vasopneumatic Device;Taping;Dry needling;Passive range of motion;Manual techniques    PT Next Visit Plan  DC to gym program    PT Home Exercise Plan  cont exercises provided by home physical therapist    Consulted and Agree with Plan of Care  Patient       Patient will benefit from skilled therapeutic intervention in order to improve the following deficits and impairments:  Pain, Decreased activity tolerance, Decreased range of motion, Decreased strength, Difficulty walking, Increased edema, Decreased balance  Visit Diagnosis: Chronic pain of right knee  Stiffness of right knee, not elsewhere classified  Difficulty in walking, not elsewhere classified     Problem List Patient Active Problem List   Diagnosis Date Noted  . 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 Total knee replacement status, right 06/27/2017  . Primary osteoarthritis of right knee 02/15/2017  . Primary osteoarthritis of left knee 02/15/2017  . 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 Nonintractable episodic headache 02/15/2017  . Chronic obstructive pulmonary disease ([place]) 11/08/2016  . Pure hypercholesterolemia 11/08/2016  . Hypoxia 11/08/2016  . Tobacco abuse 11/08/2016   PHYSICAL THERAPY DISCHARGE SUMMARY  Visits from Start of Care: 16  Current functional level related to goals / functional outcomes: See above   Remaining deficits: See goals   Education / Equipment: HEP; gym program Plan: Patient agrees to discharge.  Patient goals were partially met. Patient is being discharged due to meeting the stated rehab goals.  ?????    Joanne Kramer, PT, DPT    Akhilesh Sassone,[name], PTA 09/12/2017, 4:48 PM  [facility] [address] [place], [place], [address] Phone: [phone]   Fax:  [phone]  Name:  Joanne Kramer MRN: [id number] Date of Birth: 1945-06-06

## 2017-09-14 ENCOUNTER — Encounter: Payer: Medicare Other | Admitting: Physical Therapy

## 2017-09-15 ENCOUNTER — Other Ambulatory Visit: Payer: Self-pay | Admitting: Physician Assistant

## 2017-09-15 DIAGNOSIS — E7800 Pure hypercholesterolemia, unspecified: Secondary | ICD-10-CM

## 2017-09-18 NOTE — Telephone Encounter (Signed)
Pt did have 6 mos rck note to do labs in Dec

## 2017-09-27 DIAGNOSIS — J449 Chronic obstructive pulmonary disease, unspecified: Secondary | ICD-10-CM | POA: Diagnosis not present

## 2017-10-28 DIAGNOSIS — J449 Chronic obstructive pulmonary disease, unspecified: Secondary | ICD-10-CM | POA: Diagnosis not present

## 2017-11-08 ENCOUNTER — Ambulatory Visit (INDEPENDENT_AMBULATORY_CARE_PROVIDER_SITE_OTHER): Payer: Medicare Other | Admitting: Orthopaedic Surgery

## 2017-11-08 ENCOUNTER — Encounter (INDEPENDENT_AMBULATORY_CARE_PROVIDER_SITE_OTHER): Payer: Self-pay | Admitting: Orthopaedic Surgery

## 2017-11-08 VITALS — BP 110/68 | HR 79 | Ht 66.5 in | Wt 165.0 lb

## 2017-11-08 DIAGNOSIS — Z96651 Presence of right artificial knee joint: Secondary | ICD-10-CM | POA: Diagnosis not present

## 2017-11-08 NOTE — Progress Notes (Signed)
Office Visit Note   Patient: Joanne Kramer           Date of Birth: 11-19-1945           MRN: [id number] Visit Date: 11/08/2017              Requested by: Terald Sleeper, PA-C [address] [place], [place] [address] PCP: Terald Sleeper, PA-C   Assessment & Plan: Visit Diagnoses:  1. History of total right knee replacement     Plan: return in 6 months. Encourage exercises  Follow-Up Instructions: Return in about 6 months (around 05/09/2018).   Orders:  No orders of the defined types were placed in this encounter.  No orders of the defined types were placed in this encounter.     Procedures: No procedures performed   Clinical Data: No additional findings.   Subjective: Chief Complaint  Patient presents with  . Follow-up    06/27/17 R TOTAL KNEE REPLACEMENT THIGNS GOING GOOD JUST HAS NUMBNESS ON LATERAL SIDE OF LOWER LEG  doing well and happy with knee. Occasional pain but quite functional. Performing some exercises  HPI  Review of Systems  Constitutional: Negative for fatigue and fever.  HENT: Negative for ear pain.   Eyes: Negative for pain.  Respiratory: Positive for cough and shortness of breath.   Cardiovascular: Negative for leg swelling.  Gastrointestinal: Negative for constipation and diarrhea.  Genitourinary: Negative for difficulty urinating.  Musculoskeletal: Negative for back pain and neck pain.  Skin: Negative for rash.  Allergic/Immunologic: Negative for food allergies.  Neurological: Positive for numbness. Negative for weakness.  Hematological: Bruises/bleeds easily.  Psychiatric/Behavioral: Negative for sleep disturbance.     Objective: Vital Signs: BP 110/68 (BP Location: Left Arm, Patient Position: Sitting, Cuff Size: Normal)   Pulse 79   Ht 5' 6.5" (1.689 m)   Wt 165 lb (74.8 kg)   BMI 26.23 kg/m   Physical Exam  Constitutional: She is oriented to person, place, and time. She appears well-developed and well-nourished.  HENT:    Mouth/Throat: Oropharynx is clear and moist.  Eyes: Pupils are equal, round, and reactive to light. EOM are normal.  Pulmonary/Chest: Effort normal.  Neurological: She is alert and oriented to person, place, and time.  Skin: Skin is warm and dry.  Psychiatric: She has a normal mood and affect. Her behavior is normal.    Ortho Examright TKR with 0-125 degrees of motion. No instability. No effusion. No distal edema. N/v intact  Specialty Comments:  No specialty comments available.  Imaging: No results found.   PMFS History: Patient Active Problem List   Diagnosis Date Noted  . 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 Total knee replacement status, right 06/27/2017  . Primary osteoarthritis of right knee 02/15/2017  . Primary osteoarthritis of left knee 02/15/2017  . 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 Nonintractable episodic headache 02/15/2017  . Chronic obstructive pulmonary disease ([place]) 11/08/2016  . Pure hypercholesterolemia 11/08/2016  . Hypoxia 11/08/2016  . Tobacco abuse 11/08/2016   Past Medical History:  Diagnosis Date  . Cataract   . COPD (chronic obstructive pulmonary disease) ([place])   . Emphysema of lung ([place])   . Headache   . OA (osteoarthritis) of knee    right  . Pneumonia   . Requires supplemental oxygen     Family History  Adopted: Yes    Past Surgical History:  Procedure Laterality Date  . BREAST SURGERY  left breast aspiration  . CESAREAN SECTION    . COLONOSCOPY    . EYE SURGERY    . KNEE SURGERY Right   . TONSILLECTOMY    . TOTAL KNEE ARTHROPLASTY Right 06/27/2017   Procedure: RIGHT TOTAL KNEE ARTHROPLASTY;  Surgeon: Garald Balding, MD;  Location: [facility];  Service: Orthopedics;  Laterality: Right;   Social History   Occupational History  . Not on file  Tobacco Use  . Smoking status: Current Some Day Smoker    Packs/day: 1.00    Years: 55.00    Pack years: 55.00    Types: Cigarettes    Last  attempt to quit: 05/07/2017    Years since quitting: 0.5  . Smokeless tobacco: Never Used  Substance and Sexual Activity  . Alcohol use: No  . Drug use: No  . Sexual activity: Not Currently

## 2017-11-27 ENCOUNTER — Encounter: Payer: Self-pay | Admitting: *Deleted

## 2017-11-27 ENCOUNTER — Ambulatory Visit (INDEPENDENT_AMBULATORY_CARE_PROVIDER_SITE_OTHER): Payer: Medicare Other | Admitting: *Deleted

## 2017-11-27 VITALS — BP 105/70 | HR 73 | Ht 64.0 in | Wt 169.0 lb

## 2017-11-27 DIAGNOSIS — Z0000 Encounter for general adult medical examination without abnormal findings: Secondary | ICD-10-CM | POA: Diagnosis not present

## 2017-11-27 DIAGNOSIS — Z1212 Encounter for screening for malignant neoplasm of rectum: Secondary | ICD-10-CM | POA: Diagnosis not present

## 2017-11-27 DIAGNOSIS — Z23 Encounter for immunization: Secondary | ICD-10-CM | POA: Diagnosis not present

## 2017-11-27 DIAGNOSIS — Z1211 Encounter for screening for malignant neoplasm of colon: Secondary | ICD-10-CM

## 2017-11-27 DIAGNOSIS — J449 Chronic obstructive pulmonary disease, unspecified: Secondary | ICD-10-CM | POA: Diagnosis not present

## 2017-11-27 NOTE — Patient Instructions (Signed)
Ms. Chagnon , Thank you for taking time to come for your Medicare Wellness Visit. I appreciate your ongoing commitment to your health goals. Please review the following plan we discussed and let me know if I can assist you in the future.   These are the goals we discussed: Goals    . Quit Smoking       This is a list of the screening recommended for you and due dates:  Health Maintenance  Topic Date Due  .  Hepatitis C: One time screening is recommended by Center for Disease Control  (CDC) for  adults born from [birth year] through 1963.   [birth year]  . Tetanus Vaccine  05/08/1966  . Colon Cancer Screening  05/07/1997  . DEXA scan (bone density measurement)  05/07/2012  . Pneumonia vaccines (1 of 2 - PCV13) 05/07/2012  . Flu Shot  09/28/2017  . Mammogram  10/26/2018    Diphtheria/Tetanus Toxoids; Pertussis Vaccine, DTP injection What is this medicine? DIPHTHERIA and TETANUS TOXOIDS; PERTUSSIS VACCINE (dif THEER ee uh and TET n [place] TOK soids; per TUS iss VAK seen) is used to prevent diphtheria, tetanus, and pertussis infections. This medicine may be used for other purposes; ask your health care provider or pharmacist if you have questions. COMMON BRAND NAME(S): Adacel, Boostrix, Certiva, Daptacel, Infanrix, Tripedia What should I tell my health care provider before I take this medicine? They need to know if you have any of these conditions: -blood disorders like hemophilia -fever or infection -immune system problems -neurologic disease -seizures -an unusual or allergic reaction to vaccines, thimerosal, latex, other medicines, foods, dyes, or preservatives -pregnant or trying to get pregnant -breast-feeding How should I use this medicine? This vaccine is for injection into a muscle. It is given by a health care professional. A copy of Vaccine Information Statements will be given before each vaccination. Read this sheet carefully each time. The sheet may change frequently. Talk to your  pediatrician regarding the use of this vaccine in children. While the DTP vaccine may be given to children ages 61 weeks to 7 years and the Tdap vaccine may be given to children at least 107 years old, precautions do apply. Overdosage: If you think you have taken too much of this medicine contact a poison control center or emergency room at once. NOTE: This medicine is only for you. Do not share this medicine with others. What if I miss a dose? It is important not to miss your dose. Call your doctor or health care professional if you are unable to keep an appointment. What may interact with this medicine? -immune globulin -medicines that suppress your immune function like adalimumab, anakinra, infliximab -medicines to treat cancer -medicines that treat or prevent blood clots like warfarin, enoxaparin, and dalteparin -steroid medicines like prednisone or cortisone This list may not describe all possible interactions. Give your health care provider a list of all the medicines, herbs, non-prescription drugs, or dietary supplements you use. Also tell them if you smoke, drink alcohol, or use illegal drugs. Some items may interact with your medicine. What should I watch for while using this medicine? See your health care provider for all shots of this vaccine as directed. To have protection from infection, you must have 3 shots of this vaccine plus boosters as needed. Tell your doctor right away if you have any serious or unusual side effects after getting this vaccine. What side effects may I notice from receiving this medicine? Side effects that you should report to  your doctor or health care professional as soon as possible: -allergic reactions like skin rash, itching or hives, swelling of the face, lips, or tongue -breathing problems -fever of 103 degrees F or more -flu-like symptoms -inconsolable crying -infection -pain, tingling, numbness in the hands or feet -seizures -swelling of arm or leg  that was injected -unusually weak or tired Side effects that usually do not require medical attention (report to your doctor or health care professional if they continue or are bothersome): -fussy, irritable -loss of appetite -low-grade fever -pain, tenderness, redness, swelling, or a 'knot' at site where injected -vomiting This list may not describe all possible side effects. Call your doctor for medical advice about side effects. You may report side effects to FDA at 1-800-FDA-1088. Where should I keep my medicine? This drug is given in a hospital or clinic and will not be stored at home. NOTE: This sheet is a summary. It may not cover all possible information. If you have questions about this medicine, talk to your doctor, pharmacist, or health care provider.  2018 Elsevier/Gold Standard (2015-03-19 12:43:42)

## 2017-11-27 NOTE — Progress Notes (Addendum)
Subjective:   Joanne Kramer is a 72 y.o. female who presents for a Medicare Annual Wellness Visit. Joelyn lives at home with her female companion. She has two children. One lives in [place] and one in [place]. She also has 2 grandkids in [place]. She is active and enjoys going to a book discussion group at [employer] and she exercises at the [facility] twice a week. She would like to pickup a new hobby.  She moved from [place] last summer.    Review of Systems    Patient reports that her overall health is worse compared to last year.  Musculoskeletal: bilateral knee pain. Had right knee replacement in April 2018.   Neurological: Headaches when she wakes up in the morning. She had a lot of tests done and everything was negative. Wakes up 3 times a week with posterior head pain. Takes Aleve daily. Takes 3 at at [facility] when she wakes up with a headache. Also has chronic numbness/tingling on both lateral sides of the feet. Now middle toe on both feet have some numbness.   Psych: Melatonin 5mg  is much less effective than it has been. States that she has good sleep hygiene. Sometimes takes 1 to 3 hours to go to sleep . Tries relaxation techniques after going to bed but that doesn't seem to help either.    All other systems negative       Current Medications (verified) Outpatient Encounter Medications as of 11/27/2017  Medication Sig  . Calcium Carb-Cholecalciferol (CALCIUM 600+D3 PO) Take 1 tablet by mouth daily.  . calcium carbonate (TUMS EX) 750 MG chewable tablet Chew 1-2 tablets by mouth 3 (three) times daily as needed (for heartburn/indigestion.).   Marland Kitchen Fluticasone-Salmeterol (ADVAIR DISKUS) 250-50 MCG/DOSE AEPB Inhale 1 puff into the lungs 2 (two) times daily.  . Ipratropium-Albuterol (COMBIVENT RESPIMAT) 20-100 MCG/ACT AERS respimat Inhale 1 puff into the lungs every 4 (four) hours as needed ((typically 4x's daily)).  . Melatonin 5 MG TABS Take 5 mg by mouth at bedtime as needed (for  sleep.).   Marland Kitchen Multiple Vitamin (MULTIVITAMIN WITH MINERALS) TABS tablet Take 1 tablet by mouth daily.  . OXYGEN Inhale 2 L into the lungs at bedtime.  . simvastatin (ZOCOR) 20 MG tablet TAKE 1 TABLET BY MOUTH AT BEDTIME  . triamcinolone cream (KENALOG) 0.1 % Apply 1 application topically 2 (two) times daily as needed (for skin irritation/contact dermatitis).   Marland Kitchen amoxicillin (AMOXIL) 500 MG capsule Take 4 pills one hour prior to dental procedure (Patient not taking: Reported on 09/06/2017)   No facility-administered encounter medications on file as of 11/27/2017.     Allergies (verified) Patient has no known allergies.   History: Past Medical History:  Diagnosis Date  . Contact dermatitis    chronic itching but skin biopsy did not show anything specific  . COPD (chronic obstructive pulmonary disease) ([place])   . Emphysema of lung ([place])   . Headache   . OA (osteoarthritis) of knee    right  . Pneumonia   . Requires supplemental oxygen    Past Surgical History:  Procedure Laterality Date  . BREAST SURGERY     left breast aspiration  . CESAREAN SECTION    . COLONOSCOPY    . EYE SURGERY    . KNEE SURGERY Right   . TONSILLECTOMY    . TOTAL KNEE ARTHROPLASTY Right 06/27/2017   Procedure: RIGHT TOTAL KNEE ARTHROPLASTY;  Surgeon: Garald Balding, MD;  Location: [facility];  Service: Orthopedics;  Laterality: Right;   Family History  Adopted: Yes   Social History   Socioeconomic History  . Marital status: Soil scientist    Spouse name: Not on file  . Number of children: 2  . Years of education: 43  . Highest education level: Not on file  Occupational History  . Occupation: Retired    Comment: Careers information officer  Social Needs  . Financial resource strain: Not hard at all  . Food insecurity:    Worry: Never true    Inability: Never true  . Transportation needs:    Medical: No    Non-medical: No  Tobacco Use  . Smoking status: Current Some Day Smoker    Packs/day: 1.00     Years: 55.00    Pack years: 55.00    Types: Cigarettes    Last attempt to quit: 05/07/2017    Years since quitting: 0.5  . Smokeless tobacco: Never Used  Substance and Sexual Activity  . Alcohol use: No  . Drug use: No  . Sexual activity: Not Currently  Lifestyle  . Physical activity:    Days per week: 5 days    Minutes per session: 60 min  . Stress: Not at all  Relationships  . Social connections:    Talks on phone: More than three times a week    Gets together: More than three times a week    Attends religious service: Never    Active member of club or organization: No    Attends meetings of clubs or organizations: Never    Relationship status: Living with partner  Other Topics Concern  . Not on file  Social History Narrative  . Not on file    Tobacco Use No.  Clinical Intake:     Pain : No/denies pain     Nutritional Status: BMI 25 -29 Overweight Diabetes: No  How often do you need to have someone help you when you read instructions, pamphlets, or other written materials from your doctor or pharmacy?: 1 - Never  Interpreter Needed?: No  Information entered by :: Chong Sicilian, RN   Activities of Daily Living In your present state of health, do you have any difficulty performing the following activities: 06/16/2017  Hearing? N  Vision? N  Difficulty concentrating or making decisions? N  Walking or climbing stairs? Y  Dressing or bathing? N  Doing errands, shopping? N  Some recent data might be hidden    Diet 2 meals a day-cooks at home mostly and eats out about twice a month Drinks mostly tea, water, and some soda  Exercise  Goes to the [facility] twice a week and also rides a recumbent bike   Depression Screen PHQ 2/9 Scores 11/27/2017 08/16/2017 06/19/2017 05/22/2017 02/15/2017 11/23/2016 11/04/2016  PHQ - 2 Score 0 0 0 1 0 0 0     Fall Risk Fall Risk  11/27/2017 08/16/2017 06/19/2017 05/22/2017 02/15/2017  Falls in the past year? No No No No No     Safety Is the patient's home free of loose throw rugs in walkways, pet beds, electrical cords, etc?   yes      Grab bars in the bathroom? yes      Walkin shower? yes      Shower Seat? no      Handrails on the stairs?   yes      Adequate lighting?   yes  Patient Care Team: Theodoro Clock as PCP - General (Physician Assistant) Herminio Commons,  MD as PCP - Cardiology (Cardiology)  Hospitalizations, surgeries, and ER visits in previous 12 months Right knee surgery in 05/2016. No other hospitalizations, ER visits, or surgeries.   Objective:    Today's Vitals   11/27/17 1107  BP: 105/70  Pulse: 73  Weight: 169 lb (76.7 kg)  Height: 5\' 4"  (1.626 m)   Body mass index is 29.01 kg/m.  Advanced Directives 11/27/2017 07/17/2017 06/16/2017 11/23/2016  Does Patient Have a Medical Advance Directive? No - No No  Would patient like information on creating a medical advance directive? Yes (MAU/Ambulatory/Procedural Areas - Information given) No - Patient declined No - Patient declined Yes (ED - Information included in AVS)    Hearing/Vision  No hearing or vision deficits noted during visit.  Cognitive Function: MMSE - Mini Mental State Exam 11/27/2017 11/23/2016  Orientation to time 5 5  Orientation to Place 5 5  Registration 3 3  Attention/ Calculation 5 5  Recall 3 3  Language- name 2 objects 2 2  Language- repeat 1 1  Language- follow 3 step command 3 3  Language- read & follow direction 1 1  Write a sentence 1 1  Copy design 1 1  Total score 30 30       Normal Cognitive Function Screening: Yes    Immunizations and Health Maintenance Immunization History  Administered Date(s) Administered  . Influenza, High Dose Seasonal PF 11/23/2016, 11/27/2017  . Tdap 11/27/2017   Health Maintenance Due  Topic Date Due  . Hepatitis C Screening  1945/09/30  . COLONOSCOPY  05/07/1997  . DEXA SCAN  05/07/2012  . PNA vac Low Risk Adult (1 of 2 - PCV13) 05/07/2012  .  MAMMOGRAM  10/25/2017   Health Maintenance  Topic Date Due  . Hepatitis C Screening  09-28-1945  . COLONOSCOPY  05/07/1997  . DEXA SCAN  05/07/2012  . PNA vac Low Risk Adult (1 of 2 - PCV13) 05/07/2012  . MAMMOGRAM  10/25/2017  . TETANUS/TDAP  11/28/2027  . INFLUENZA VACCINE  Completed        Assessment:   This is a routine wellness examination for [place].    Plan:    Goals    . Quit Smoking        Health Maintenance Recommendations: Pneumococcal vaccine  Influenza vaccine Screening mammography Bone densitometry screening Colorectal cancer screening Smoking cessation counseling Advanced directives: given and patient will return a signed/notarized copy once complete Tdap  Additional Screening Recommendations: Lung: Low Dose CT Chest recommended if Age 73-80 years, 30 pack-year currently smoking OR have quit w/in 15years. Patient does qualify. Hepatitis C Screening recommended: yes  Today's Orders Orders Placed This Encounter  Procedures  . Tdap vaccine greater than or equal to 7yo IM  . Flu vaccine HIGH DOSE PF  . Cologuard  Tdap and Flu given today and cologuard ordered  Keep f/u with Terald Sleeper, PA-C and any other specialty appointments you may have Continue current medications Move carefully to avoid falls.  Aim for at least 150 minutes of moderate activity a week.  Read or work on puzzles daily Stay connected with friends and family Recommended warm/moist heat to neck and head or warm shower for pain. An appointment with her PCP to discuss options recommended.  Do not take more Aleve than recommended on the bottle  I have personally reviewed and noted the following in the patient's chart:   . Medical and social history . Use of alcohol, tobacco or illicit drugs  .  Current medications and supplements . Functional ability and status . Nutritional status . Physical activity . Advanced directives . List of other physicians . Hospitalizations,  surgeries, and ER visits in previous 12 months . Vitals . Screenings to include cognitive, depression, and falls . Referrals and appointments  In addition, I have reviewed and discussed with patient certain preventive protocols, quality metrics, and best practice recommendations. A written personalized care plan for preventive services as well as general preventive health recommendations were provided to patient.     Chong Sicilian, RN   11/27/2017    I have reviewed and agree with the above AWV documentation.   Terald Sleeper PA-C [facility] [address]  [place], [place] [address] [phone]

## 2017-12-11 ENCOUNTER — Encounter: Payer: Self-pay | Admitting: Family Medicine

## 2017-12-11 ENCOUNTER — Ambulatory Visit (INDEPENDENT_AMBULATORY_CARE_PROVIDER_SITE_OTHER): Payer: Medicare Other | Admitting: Family Medicine

## 2017-12-11 VITALS — BP 115/80 | HR 86 | Temp 97.0°F | Ht 64.0 in | Wt 166.0 lb

## 2017-12-11 DIAGNOSIS — R928 Other abnormal and inconclusive findings on diagnostic imaging of breast: Secondary | ICD-10-CM

## 2017-12-11 DIAGNOSIS — Z1231 Encounter for screening mammogram for malignant neoplasm of breast: Secondary | ICD-10-CM

## 2017-12-11 DIAGNOSIS — L2084 Intrinsic (allergic) eczema: Secondary | ICD-10-CM

## 2017-12-11 DIAGNOSIS — K5900 Constipation, unspecified: Secondary | ICD-10-CM

## 2017-12-11 DIAGNOSIS — R921 Mammographic calcification found on diagnostic imaging of breast: Secondary | ICD-10-CM | POA: Diagnosis not present

## 2017-12-11 MED ORDER — TRIAMCINOLONE ACETONIDE 0.1 % EX CREA: 1.0000 "application " | TOPICAL_CREAM | Freq: Two times a day (BID) | CUTANEOUS | 1 refills | Status: DC | PRN

## 2017-12-11 NOTE — Progress Notes (Signed)
Subjective: CC: thoracic back pain PCP: Terald Sleeper, PA-C [id number] Seiter is a 72 y.o. female presenting to clinic today for:  1. Back pain Patient reports onset of right sided thoracic back pain radiating to the right upper abdomen about 1 week ago.  She notes that this is been associated with constipation.  She did a laxative cleanout several days ago and had 2 bowel movements as a result, though it took her about 3 days to have the bowel movement.  She does report that she had an improvement in symptoms after the cleanout but states that when she woke up Saturday morning she was starting to experience them again.  She has been constipated again since that time.  She has not tried to repeat the laxative.  She is tried using Tylenol, Aleve and Advil with little improvement in symptoms.  The pain is described as throbbing with intermittent sharp symptoms.  She feels that this is associated with gas as well.  She has history of sciatica in the past and states that this does not feel like sciatica.  Denies any preceding injury to the upper back.  No hemoptysis or shortness of breath outside of baseline.  No history of chronic constipation.  2.  Need for mammogram Patient requests mammogram order, which was addressed at her Medicare annual wellness visit.  She has not been called for an appointment yet.   ROS: Per HPI  No Known Allergies Past Medical History:  Diagnosis Date  . Contact dermatitis    chronic itching but skin biopsy did not show anything specific  . COPD (chronic obstructive pulmonary disease) ([place])   . Emphysema of lung ([place])   . Headache   . OA (osteoarthritis) of knee    right  . Pneumonia   . Requires supplemental oxygen     Current Outpatient Medications:  .  Calcium Carb-Cholecalciferol (CALCIUM 600+D3 PO), Take 1 tablet by mouth daily., Disp: , Rfl:  .  calcium carbonate (TUMS EX) 750 MG chewable tablet, Chew 1-2 tablets by mouth 3 (three) times daily as  needed (for heartburn/indigestion.). , Disp: , Rfl:  .  Fluticasone-Salmeterol (ADVAIR DISKUS) 250-50 MCG/DOSE AEPB, Inhale 1 puff into the lungs 2 (two) times daily., Disp: , Rfl:  .  Ipratropium-Albuterol (COMBIVENT RESPIMAT) 20-100 MCG/ACT AERS respimat, Inhale 1 puff into the lungs every 4 (four) hours as needed ((typically 4x's daily))., Disp: 4 g, Rfl: 11 .  Melatonin 5 MG TABS, Take 5 mg by mouth at bedtime as needed (for sleep.). , Disp: , Rfl:  .  Multiple Vitamin (MULTIVITAMIN WITH MINERALS) TABS tablet, Take 1 tablet by mouth daily., Disp: , Rfl:  .  OXYGEN, Inhale 2 L into the lungs at bedtime., Disp: , Rfl:  .  simvastatin (ZOCOR) 20 MG tablet, TAKE 1 TABLET BY MOUTH AT BEDTIME, Disp: 90 tablet, Rfl: 1 .  triamcinolone cream (KENALOG) 0.1 %, Apply 1 application topically 2 (two) times daily as needed (for skin irritation/contact dermatitis)., Disp: 453.6 g, Rfl: 1 Social History   Socioeconomic History  . Marital status: Soil scientist    Spouse name: Not on file  . Number of children: 2  . Years of education: 41  . Highest education level: Not on file  Occupational History  . Occupation: Retired    Comment: Careers information officer  Social Needs  . Financial resource strain: Not hard at all  . Food insecurity:    Worry: Never true    Inability: Never true  .  Transportation needs:    Medical: No    Non-medical: No  Tobacco Use  . Smoking status: Current Some Day Smoker    Packs/day: 1.00    Years: 55.00    Pack years: 55.00    Types: Cigarettes    Last attempt to quit: 05/07/2017    Years since quitting: 0.5  . Smokeless tobacco: Never Used  Substance and Sexual Activity  . Alcohol use: No  . Drug use: No  . Sexual activity: Not Currently  Lifestyle  . Physical activity:    Days per week: 5 days    Minutes per session: 60 min  . Stress: Not at all  Relationships  . Social connections:    Talks on phone: More than three times a week    Gets together: More than three  times a week    Attends religious service: Never    Active member of club or organization: No    Attends meetings of clubs or organizations: Never    Relationship status: Living with partner  . Intimate partner violence:    Fear of current or ex partner: No    Emotionally abused: No    Physically abused: No    Forced sexual activity: No  Other Topics Concern  . Not on file  Social History Narrative  . Not on file   Family History  Adopted: Yes    Objective: Office vital signs reviewed. BP 115/80 (BP Location: Right Arm)   Pulse 86   Temp (!) 97 F (36.1 C) (Oral)   Ht 5\' 4"  (1.626 m)   Wt 166 lb (75.3 kg)   BMI 28.49 kg/m   Physical Examination:  General: Awake, alert, well nourished, No acute distress HEENT: MMM, sclera white Cardio: regular rate and rhythm, S1S2 heard, no murmurs appreciated Pulm: clear to auscultation bilaterally, no wheezes, rhonchi or rales; normal work of breathing on room air GI: soft, mild TTP in the lower abdomen. Abdomen slightly full feeling in the RLQ. No peritoneal signs. Non-distended, bowel sounds present x4, no hepatomegaly, no splenomegaly, no masses Extremities: warm, well perfused, No edema, cyanosis or clubbing; +2 pulses bilaterally MSK: normal gait and station  Thoracic spine: Patient has full active range of motion all planes.  No midline tenderness palpation.  No paraspinal muscle tenderness to palpation.  No palpable bony abnormalities.  Lumbar spine: Patient has full active range of motion all planes.  No midline tenderness palpation.  The paraspinal muscle tenderness palpation.  She does have flattening of the lumbosacral curve.  Negative straight leg raise bilaterally.  Ribs: Mild tenderness to palpation along the 7-9th ribs on the right. Skin: dry; intact; no rashes or lesions Neuro: 5/5 LE Strength and light touch sensation grossly intact  Assessment/ Plan: 72 y.o. female   1. Constipation, unspecified constipation  type Does not seem to be MSK in nature at this time.  Her musculoskeletal exam was fairly unremarkable.  Her abdominal exam was notable for mild fullness in the right lower quadrant and mild tenderness to palpation in the lower abdomen.  I suspect that her symptoms are referred discomfort/diaphragmatic irritation from constipation and bloating.  I have given her instructions for MiraLAX cleanout and discussed reasons for return evaluation.  She voiced good understanding of follow-up PRN.  2. Intrinsic eczema This was not addressed during the visit.  Patient needed refills and PCP not available. - triamcinolone cream (KENALOG) 0.1 %; Apply 1 application topically 2 (two) times daily as needed (for skin  irritation/contact dermatitis).  Dispense: 453.6 g; Refill: 1  3. Abnormal mammogram of right breast This is addressed at her Medicare annual wellness visit.  She needs orders for mammography at [facility]; Future  4. Breast calcification, right - [facility]; Future  5. Visit for screening mammogram - [facility]; Future   Orders Placed This Encounter  Procedures  . [facility]    Standing Status:   Future    Standing Expiration Date:   02/11/2019    Order Specific Question:   Reason for Exam (SYMPTOM  OR DIAGNOSIS REQUIRED)    Answer:   his of abnormal mammo on right, right breast calcifications    Order Specific Question:   Preferred imaging location?    Answer:   [facility]    Standing Status:   Future    Standing Expiration Date:   02/11/2019    Order Specific Question:   Reason for Exam (SYMPTOM  OR DIAGNOSIS REQUIRED)    Answer:   screen for breast cancer    Order Specific Question:   Preferred imaging location?    Answer:   [facility]   Meds ordered this encounter  Medications  . triamcinolone cream (KENALOG) 0.1 %    Sig:  Apply 1 application topically 2 (two) times daily as needed (for skin irritation/contact dermatitis).    Dispense:  453.6 g    Refill:  [place], DO [place] [phone]

## 2017-12-11 NOTE — Patient Instructions (Signed)
Thank you for coming in to clinic today.  1. Your symptoms are consistent with Constipation, likely cause of your General Abdominal Pain / Cramping. 2. Start with Miralax sent to pharmacy. First dose 68g (4 capfuls) in 32oz water over 1 to 2 hours for clean out. Next day start 17g or 1 capful daily, may adjust dose up or down by half a capful every few days. Recommend to take this medicine daily for next 1-2 weeks, then may need to use it longer if needed. - Goal is to have soft regular bowel movement 1-3x daily, if too runny or diarrhea, then reduce dose of the medicine  Improve water intake, hydration will help Also recommend increased vegetables, fruits, fiber intake Can try daily Metamucil or Fiber supplement at pharmacy over the counter  Follow-up if symptoms are not improving with bowel movements, or if pain worsens, develop fevers, nausea, vomiting.  Please schedule a follow-up appointment with Particia Nearing if needed in 1 month to follow-up Constipation  If you have any other questions or concerns, please feel free to call the clinic to contact me. You may also schedule an earlier appointment if necessary.  However, if your symptoms get significantly worse, please go to the Emergency Department to seek immediate medical attention.

## 2017-12-13 ENCOUNTER — Other Ambulatory Visit: Payer: Self-pay | Admitting: Family Medicine

## 2017-12-13 ENCOUNTER — Telehealth: Payer: Self-pay | Admitting: Physician Assistant

## 2017-12-13 DIAGNOSIS — R921 Mammographic calcification found on diagnostic imaging of breast: Secondary | ICD-10-CM

## 2017-12-13 NOTE — Telephone Encounter (Signed)
Left message for patient to call to schedule an appointment for constipation.

## 2017-12-13 NOTE — Telephone Encounter (Signed)
Please advise 

## 2017-12-13 NOTE — Telephone Encounter (Signed)
I recommend she be seen.  May need to consider imaging.

## 2017-12-14 ENCOUNTER — Encounter: Payer: Self-pay | Admitting: Pediatrics

## 2017-12-14 ENCOUNTER — Ambulatory Visit (INDEPENDENT_AMBULATORY_CARE_PROVIDER_SITE_OTHER): Payer: Medicare Other | Admitting: Pediatrics

## 2017-12-14 VITALS — BP 103/71 | HR 101 | Temp 97.2°F | Ht 64.0 in | Wt 163.4 lb

## 2017-12-14 DIAGNOSIS — K5900 Constipation, unspecified: Secondary | ICD-10-CM

## 2017-12-14 NOTE — Progress Notes (Signed)
  Subjective:   Patient ID: Joanne Kramer, female    DOB: 11/30/1945, 72 y.o.   MRN: [id number] CC: Constipation (Follow up)  HPI: Joanne Kramer is a 72 y.o. female   Seen 3 days ago for constipation.  Was recommended to take 4 doses of MiraLAX to help with constipation.  Patient had one fairly normal bowel movement last night.  Was slightly hard, did not have to push or strain.  Prior to that her last bowel movement had been 5 days ago.  Bowel movement before that had been a week prior.  Her appetite has been down.  She is regularly having abdominal discomfort, especially in right upper quadrant when her stomach starts gurgling.  She is passing gas regularly.  She had trouble with constipation medially following knee surgery in April 2019.  She is had some trouble with constipation over the last couple weeks.  Otherwise usually does not have much trouble with constipation.  Drinks 3 to 4 glasses of water a day, 2 to 3 cups of tea in the morning.  Tries to eat a varied diet, says she can probably have more fiber in her diet that she does.  No recent fevers.  Relevant past medical, surgical, family and social history reviewed. Allergies and medications reviewed and updated. Social History   Tobacco Use  Smoking Status Current Some Day Smoker  . Packs/day: 1.00  . Years: 55.00  . Pack years: 55.00  . Types: Cigarettes  . Last attempt to quit: 05/07/2017  . Years since quitting: 0.6  Smokeless Tobacco Never Used   ROS: Per HPI   Objective:    BP 103/71   Pulse (!) 101   Temp (!) 97.2 F (36.2 C) (Oral)   Ht 5\' 4"  (1.626 m)   Wt 163 lb 6.4 oz (74.1 kg)   BMI 28.05 kg/m   Wt Readings from Last 3 Encounters:  12/14/17 163 lb 6.4 oz (74.1 kg)  12/11/17 166 lb (75.3 kg)  11/27/17 169 lb (76.7 kg)    Gen: NAD, alert, cooperative with exam, NCAT EYES: EOMI, no conjunctival injection, or no icterus CV: NRRR, normal S1/S2, no murmur, distal pulses 2+ b/l Resp: CTABL, no wheezes,  normal WOB Abd: +BS, soft, NTND. no guarding or organomegaly Ext: No edema, warm Neuro: Alert and oriented MSK: normal muscle bulk  Assessment & Plan:  [name] was seen today for constipation.  Diagnoses and all orders for this visit:  Constipation, unspecified constipation type Ongoing symptoms, discussed next steps in care, importance to relieve current episode of constipation with cleanout and then follow-up maintenance bowel regimen to prevent future episodes.  Linzess in the morning before breakfast. Increase to 2 daily if needed. Magnesium citrate for cleanout as MiraLAX did not help. Two Ducolax or senna as needed miralax as needed  Stick with mostly liquid diet while cleaning out  Maintenance: Continue linzess every morning If no stool in 24-48 hours, start miralax 1-3 times a day until stool  Follow up plan: As needed Assunta Found, MD [place]

## 2017-12-14 NOTE — Patient Instructions (Addendum)
Linzess in the morning before breakfast. Increase to 2 daily if needed. Magnesium citrate  Two Ducolax or senna miralax as needed  Stick with mostly liquid diet while cleaning out  Maintenance: Continue linzess every morning If no stool in 24-48 hours, start miralax 1-3 times a day until stool

## 2017-12-19 ENCOUNTER — Other Ambulatory Visit: Payer: Self-pay | Admitting: Family Medicine

## 2017-12-19 DIAGNOSIS — R928 Other abnormal and inconclusive findings on diagnostic imaging of breast: Secondary | ICD-10-CM

## 2017-12-26 ENCOUNTER — Other Ambulatory Visit: Payer: Self-pay | Admitting: Family Medicine

## 2017-12-26 ENCOUNTER — Ambulatory Visit (HOSPITAL_COMMUNITY)
Admission: RE | Admit: 2017-12-26 | Discharge: 2017-12-26 | Disposition: A | Discharge status: Home / Self Care | Payer: Medicare Other | Source: Ambulatory Visit | Attending: Family Medicine | Admitting: Family Medicine

## 2017-12-26 ENCOUNTER — Ambulatory Visit (HOSPITAL_COMMUNITY): Payer: Medicare Other

## 2017-12-26 DIAGNOSIS — R921 Mammographic calcification found on diagnostic imaging of breast: Secondary | ICD-10-CM | POA: Diagnosis not present

## 2017-12-26 DIAGNOSIS — R928 Other abnormal and inconclusive findings on diagnostic imaging of breast: Secondary | ICD-10-CM | POA: Insufficient documentation

## 2017-12-26 DIAGNOSIS — N6320 Unspecified lump in the left breast, unspecified quadrant: Secondary | ICD-10-CM | POA: Diagnosis not present

## 2017-12-28 DIAGNOSIS — J449 Chronic obstructive pulmonary disease, unspecified: Secondary | ICD-10-CM | POA: Diagnosis not present

## 2018-01-02 ENCOUNTER — Encounter (HOSPITAL_COMMUNITY): Payer: Self-pay

## 2018-01-02 ENCOUNTER — Ambulatory Visit (HOSPITAL_COMMUNITY)
Admission: RE | Admit: 2018-01-02 | Discharge: 2018-01-02 | Disposition: A | Discharge status: Home / Self Care | Payer: Medicare Other | Source: Ambulatory Visit | Attending: Family Medicine | Admitting: Family Medicine

## 2018-01-02 ENCOUNTER — Other Ambulatory Visit: Payer: Self-pay | Admitting: Family Medicine

## 2018-01-02 DIAGNOSIS — R921 Mammographic calcification found on diagnostic imaging of breast: Secondary | ICD-10-CM | POA: Diagnosis present

## 2018-01-02 DIAGNOSIS — R590 Localized enlarged lymph nodes: Secondary | ICD-10-CM | POA: Diagnosis not present

## 2018-01-02 DIAGNOSIS — R928 Other abnormal and inconclusive findings on diagnostic imaging of breast: Secondary | ICD-10-CM

## 2018-01-02 DIAGNOSIS — Z170 Estrogen receptor positive status [ER+]: Secondary | ICD-10-CM | POA: Diagnosis not present

## 2018-01-02 DIAGNOSIS — C50412 Malignant neoplasm of upper-outer quadrant of left female breast: Secondary | ICD-10-CM | POA: Insufficient documentation

## 2018-01-02 DIAGNOSIS — N6321 Unspecified lump in the left breast, upper outer quadrant: Secondary | ICD-10-CM | POA: Diagnosis not present

## 2018-01-02 MED ORDER — LIDOCAINE-EPINEPHRINE (PF) 1 %-1:200000 IJ SOLN
INTRAMUSCULAR | Status: AC
  Administered 2018-01-02: 10 mL
  Filled 2018-01-02: qty 30

## 2018-01-02 MED ORDER — LIDOCAINE HCL (PF) 1 % IJ SOLN
INTRAMUSCULAR | Status: AC
  Administered 2018-01-02: 10 mL
  Filled 2018-01-02: qty 10

## 2018-01-16 ENCOUNTER — Other Ambulatory Visit: Payer: Self-pay | Admitting: General Surgery

## 2018-01-16 ENCOUNTER — Ambulatory Visit: Payer: Medicare Other | Admitting: General Surgery

## 2018-01-16 ENCOUNTER — Encounter: Payer: Self-pay | Admitting: General Surgery

## 2018-01-16 VITALS — BP 150/88 | HR 94 | Temp 97.1°F | Resp 18 | Wt 165.0 lb

## 2018-01-16 DIAGNOSIS — Z170 Estrogen receptor positive status [ER+]: Secondary | ICD-10-CM | POA: Insufficient documentation

## 2018-01-16 DIAGNOSIS — Z853 Personal history of malignant neoplasm of breast: Secondary | ICD-10-CM

## 2018-01-16 DIAGNOSIS — C50912 Malignant neoplasm of unspecified site of left female breast: Secondary | ICD-10-CM

## 2018-01-16 DIAGNOSIS — C50412 Malignant neoplasm of upper-outer quadrant of left female breast: Secondary | ICD-10-CM

## 2018-01-16 NOTE — H&P (Signed)
[facility] History and Physical  Reason for Referral: Left breast cancer  Referring Physician:  Particia Nearing PA  Joanne Kramer is a 72 y.o. female.  HPI: Joanne Kramer is a 72 yo with a newly diagnosed left breast cancer after her normal screening mammogram.  The patient has had a cyst in the area for years but this mammogram demonstrated some suspicious findings. 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 She denies any other history of lumps or bumps aside from the cyst that was adjacent to the cancer.  She had menarche at age 60, and her first pregnancy at age 85. She is G1P1. She did not breastfeed her children.  She was adopted. She underwent menopause at 78.  She has never had any previous biopsies or concerning areas on mammogram.  She has not had any chest radiation.  She does complain about having some left arm pain following her biopsies and that she had pain shooting into the arm that day.      Past Medical History:  Diagnosis Date  . Contact dermatitis    chronic itching but skin biopsy did not show anything specific  . COPD (chronic obstructive pulmonary disease) ([place])   . Emphysema of lung ([place])   . Headache   . OA (osteoarthritis) of knee    right  . Pneumonia   . Requires supplemental oxygen          Past Surgical History:  Procedure Laterality Date  . BREAST SURGERY     left breast aspiration  . CESAREAN SECTION    . COLONOSCOPY    . EYE SURGERY    . KNEE SURGERY Right   . TONSILLECTOMY    . TOTAL KNEE ARTHROPLASTY Right 06/27/2017   Procedure: RIGHT TOTAL KNEE ARTHROPLASTY;  Surgeon: Garald Balding, MD;  Location: [facility];  Service: Orthopedics;  Laterality: Right;    Family History  Adopted: Yes    Social History        Tobacco Use  . Smoking status: Current Some Day Smoker    Packs/day: 1.00    Years: 55.00    Pack years: 55.00    Types:  Cigarettes    Last attempt to quit: 05/07/2017    Years since quitting: 0.6  . Smokeless tobacco: Never Used  Substance Use Topics  . Alcohol use: No  . Drug use: No    Medications: I have reviewed the patient's current medications. Allergies as of 01/16/2018   No Known Allergies              Medication List            Accurate as of 01/16/18 10:29 AM. Always use your most recent med list.           ADVAIR DISKUS 250-50 MCG/DOSE Aepb Generic drug:  Fluticasone-Salmeterol Inhale 1 puff into the lungs 2 (two) times daily.   CALCIUM 600+D3 PO Take 1 tablet by mouth daily.   calcium carbonate 750 MG chewable tablet Commonly known as:  TUMS EX Chew 1-2 tablets by mouth 3 (three) times daily as needed (for heartburn/indigestion.).   Ipratropium-Albuterol 20-100 MCG/ACT Aers respimat Commonly known as:  COMBIVENT Inhale 1 puff into the lungs every 4 (four) hours as needed ((typically 4x's daily)).   Melatonin 5 MG Tabs Take 5 mg by mouth at bedtime as needed (for sleep.).   multivitamin with minerals Tabs tablet Take 1 tablet by mouth daily.   OXYGEN  Inhale 2 L into the lungs at bedtime.   simvastatin 20 MG tablet Commonly known as:  ZOCOR TAKE 1 TABLET BY MOUTH AT BEDTIME   triamcinolone cream 0.1 % Commonly known as:  KENALOG Apply 1 application topically 2 (two) times daily as needed (for skin irritation/contact dermatitis).        ROS:  A comprehensive review of systems was negative except for: Respiratory: positive for cough, wheezing and SOB Musculoskeletal: positive for stiff joints Neurological: positive for headaches  Blood pressure (!) 150/88, pulse 94, temperature (!) 97.1 F (36.2 C), temperature source Temporal, resp. rate 18, weight 165 lb (74.8 kg). Physical Exam  Constitutional: She is oriented to person, place, and time. She appears well-developed and well-nourished.  HENT:  Head: Normocephalic and atraumatic.    Eyes: Pupils are equal, round, and reactive to light. EOM are normal.  Neck: Normal range of motion. Neck supple.  Cardiovascular: Normal rate and regular rhythm.  Pulmonary/Chest: Effort normal and breath sounds normal. Right breast exhibits no inverted nipple, no mass, no nipple discharge, no skin change and no tenderness. Left breast exhibits mass. Left breast exhibits no inverted nipple, no nipple discharge and no skin change.  Left outer breast with 1cm area corresponding to cyst, bruising over area  Abdominal: Soft. She exhibits no distension. There is no tenderness.  Musculoskeletal: Normal range of motion. She exhibits no edema.  Lymphadenopathy:    She has no cervical adenopathy.    She has no axillary adenopathy.  Neurological: She is alert and oriented to person, place, and time.  Skin: Skin is warm and dry.  Psychiatric: She has a normal mood and affect. Her behavior is normal. Judgment and thought content normal.  Vitals reviewed.   Results: Mammogram /US 11/2017 IMPRESSION: 1. Suspicious 9 mm mass involving the UPPER OUTER QUADRANT of the LEFT breast, immediately adjacent to a longstanding benign LEFT breast cyst. 2. Solitary borderline LEFT axillary lymph node with a cortical thickness of 4 mm. 3. Stable likely benign RIGHT breast calcifications dating back to August, 2018.  RECOMMENDATION: 1. Ultrasound-guided core needle biopsy of the suspicious LEFT breast mass and the borderline LEFT axillary lymph node. 2. Diagnostic mammography in 1 year with spot magnification views of the RIGHT breast calcifications at that time.  The ultrasound core needle biopsy procedure was discussed with patient and her questions were answered. She has agreed to proceed and the biopsy has been scheduled for Tuesday, November 5 at 1 o'clock p.m.  I have discussed the findings and recommendations with the patient. Results were also provided in writing at the conclusion of  the visit. If applicable, a reminder letter will be sent to the patient regarding the next appointment.  BI-RADS CATEGORY 4: Suspicious.  Pathology: ER/PR +  Diagnosis 1. 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 SEE NOTE. 2. Lymph node, needle/core biopsy, left axilla - LYMPH NODE, NEGATIVE FOR CARCINOMA.  Assessment & Plan:  Joanne Kramer is a 72 y.o. female with a left breast cancer ER/PR+.  She is otherwise doing well and wants to get this taken care of as soon as possible.   We have discussed the options for surgery including the option of mastectomy with sentinel node biopsy versus partial mastectomy (lumpectomy) with sentinel node biopsy. We have discussed that there is no difference in the prognosis or chance or recurrence or differences in survival between the two options. We have discussed the need for radiation with the lumpectomy, and  we have discussed that she will be referred to oncology after our procedure to further discuss her options for chemotherapy and hormonal therapy if she qualifies.   We have discussed that if she decides to have a lumpectomy that we will need to get a needle placed into the area where the biopsy was performed, since we cannot palpate a mass. We have also discussed the need for injection of radiotracer and blue dye to perform the sentinel node biopsy.  We have discussed that the sentinel node biopsy tells [place] if the cancer has spread to the lymph nodes, and can help with plans for chemotherapy treatment and overall prognosis.    We have discussed that if the lumpectomy does not remove the entire cancer that she may have to have an additional procedure, and we have discussed that a positive sentinel node can require further removal of lymph nodes from the axilla but that recent research does not show any improvement in disease free survival and carries greater risk for lymphedema.    We have discussed that  these are big discussions, and that the risk from the operations are similar including risk of bleeding, risk of infection, and risk of needing additional surgeries. We have discussed the likely need for an overnight stay with a mastectomy and a drain that will remain in place for about 1 week.     All questions were answered to the satisfaction of the patient and family.  Virl Cagey 01/16/2018, 10:29 AM

## 2018-01-16 NOTE — Patient Instructions (Signed)
Breast Cancer, Female Breast cancer is an abnormal growth of tissue (tumor) in the breast that is cancerous (malignant). Unlike noncancerous (benign) tumors, malignant tumors can spread to other parts of your body. The most common type of female breast cancer begins in the milk ducts (ductal carcinoma). Breast cancer is one of the most common types of cancer in women. What are the causes? The exact cause of female breast cancer is unknown. What increases the risk?  Age older than 33 years.  Family history of breast cancer.  Having the BRCA1 and BRCA2 genes.  Personal history of radiation exposure.  Obesity.  Menstrual periods that begin before age 47 years.  Menopause that begins after age 85 years.  Pregnant for the first time at the age of 77 years or older.  Using hormone therapy.  Drinking more than one alcoholic drink per day. What are the signs or symptoms?  A painless lump in your breast.  Changes in the size or shape of your breast.  Breast skin changes, such as puckering or dimpling.  Nipple abnormalities, such as scaling, crustiness, redness, or pulling in (retraction).  Nipple discharge that is bloody or clear. How is this diagnosed? Your health care provider will ask about your medical history. He or she may also perform a number of procedures, such as:  A physical exam. This will involve feeling the tissue around the breast and under the arms.  Taking a sample of nipple discharge. The sample will be examined under a microscope.  Breast X-rays (mammogram), breast ultrasound exams, or an MRI.  Taking a tissue sample (biopsy) from the breast. The sample will be examined under a microscope to look for cancer cells.  Your cancer will be staged to determine its severity and extent. Staging is a careful attempt to find out the size of the tumor, whether the cancer has spread, and if so, to what parts of the body. You may need to have more tests to determine the  stage of your cancer:  Stage 0-The tumor has not spread to other breast tissue.  Stage I-The cancer is only found in the breast. The tumor may be up to  in (2 cm) wide.  Stage II-The cancer has spread to nearby lymph nodes. The tumor may be up to 2 in (5 cm) wide.  Stage III-The cancer has spread to more distant lymph nodes. The tumor may be larger than 2 in (5 cm) wide.  Stage IV-The cancer has spread to other parts of the body, such as the bones, brain, liver, or lungs.  How is this treated? Depending on the type and stage, female breast cancer may be treated with one or more of the following therapies:  Surgery to remove just the tumor (lumpectomy) or the entire breast (mastectomy). Lymph nodes may also be removed.  Radiation therapy, which uses high-energy rays to kill cancer cells.  Chemotherapy, which is the use of drugs to kill cancer cells.  Hormone therapy, which involves taking medicine to adjust the hormone levels in your body. You may take medicine to decrease your estrogen levels. This can help stop cancer cells from growing.  Follow these instructions at home:  Take medicines only as directed by your health care provider.  Maintain a healthy diet.  Consider joining a support group. This may help you learn to cope with the stress of having breast cancer.  Keep all follow-up appointments as directed by your health care provider. Contact a health care provider if:  You have a sudden increase in pain.  You notice a new lump in either breast or under your arm.  You develop swelling in either arm or hand.  You lose weight without trying.  You have a fever.  You notice new fatigue or weakness. Get help right away if:  You have chest pain or trouble breathing.  You faint. This information is not intended to replace advice given to you by your health care provider. Make sure you discuss any questions you have with your health care provider. Document Released:  05/25/2005 Document Revised: 06/25/2015 Document Reviewed: 04/10/2013 Elsevier Interactive Patient Education  2017 [place].  Surgical Options for Early-Stage Breast Cancer Surgery is usually the first treatment for early-stage breast cancer. Most women have two surgery options. One is called breast-sparing surgery and the other is called mastectomy. Both surgeries have good survival rates. Everyone's breast cancer is different, even in the early stage. The best treatment for one person might not be the best treatment for you. Learn as much as you can about your cancer and work closely with your health care providers to make the best choices for you. Breast-sparing surgery With breast-sparing surgery, your cancer is removed along with some breast tissue that surrounds it. Lymph nodes from under the arm may also be removed. There are two types of breast-sparing surgeries:  Lumpectomy. With this surgery, less breast tissue is removed than with a partial mastectomy.  Partial mastectomy. With this surgery, more breast tissue is removed than with a lumpectomy.  Here are some advantages of breast-sparing surgery:  You can keep most of your breast.  Recovery is easier than from a mastectomy.  You may be able to go home the day of the procedure.  Here are some disadvantages of breast-sparing surgery:  There is a slightly higher risk than with a mastectomy that your cancer will come back. You may need more surgery.  You will probably need to have radiation therapy after surgery. Radiation therapy is given every day for about 5 weeks. It also has side effects and possible complications.  Mastectomy Mastectomy is surgery to remove your whole breast along with the cancer cells. There are two types of mastectomies:  Simple or total mastectomy. With this surgery, your breast and some lymph nodes under your arm are removed.  Modified radical mastectomy. With this surgery, many lymph nodes from  under your arm and some of the muscle layers under your breast are removed.  Here are some advantages of mastectomy:  You will probably not need to have radiation therapy after the surgery.  There is very little chance the cancer will come back.  Here are some disadvantages of mastectomy:  The recovery is longer than the recovery from breast-sparing surgery.  There are more possible complications.  Questions to ask Here are some questions to ask about each surgery:  What will recovery be like?  How will my breast look and feel?  What are the possible risks and complications of the surgery?  What treatment might I need after surgery?  What are the risks and complications of radiation therapy?  What are the risks and complications of chemotherapy?  This information is not intended to replace advice given to you by your health care provider. Make sure you discuss any questions you have with your health care provider. Document Released: 05/07/2003 Document Revised: 07/23/2015 Document Reviewed: 01/03/2013 Elsevier Interactive Patient Education  2018 [place].  Sentinel Lymph Node Biopsy in Breast Cancer Treatment  Sentinel lymph node biopsy is a procedure to identify, remove, and examine one or more lymph nodes for cancer. Lymph nodes are collections of tissue that filter infections, cancer cells, and other waste substances from the bloodstream. Cancer can spread to nearby lymph nodes. It usually spreads to one lymph node first, and then it spreads to others. The first lymph node that the cancer could spread to is called the sentinel lymph node. In some cases, there may be more than one sentinel lymph node. If you have breast cancer, you may have this procedure to determine whether your cancer has spread. For breast cancer, a sentinel lymph node is usually in your armpit because that is where breast cancer tends to spread first. If no cancer is found in a sentinel lymph node, it is  very unlikely that the cancer has spread to any of the other lymph nodes. If cancer is found in a sentinel lymph node, your surgeon may remove additional lymph nodes for examination. Tell a health care provider about:  Any allergies you have.  All medicines you are taking, including vitamins, herbs, eye drops, creams, and over-the-counter medicines.  Any problems you or family members have had with anesthetic medicines.  Any blood disorders you have.  Any surgeries you have had.  Any medical conditions you have.  Whether you are pregnant or may be pregnant. What are the risks? Generally, this is a safe procedure. However, problems may occur, including:  Infection.  Allergic reactions to medicines or dyes.  Staining of the skin where the dye is injected.  Damaged lymph vessels, causing a buildup of fluid (lymphedema).  Damage to other structures or organs.  Pain, bleeding, or bruising where a lymph node was removed (biopsy site).  A false-negative biopsy. This is when cancer cells are not found in the sentinel node, but the cancer has spread to other nodes in the area.  Medicines  Ask your health care provider about: ? Changing or stopping your regular medicines. This is especially important if you are taking diabetes medicines or blood thinners. ? Taking medicines such as aspirin and ibuprofen. These medicines can thin your blood. Do not take these medicines before your procedure if your health care provider instructs you not to.  You may be given antibiotic medicine to help prevent infection. General instructions  If you smoke, stop smoking at least 2 weeks before the procedure. This will improve your health after the procedure and reduce your risk of getting a wound infection.  You may have blood tests to make sure your blood clots normally.  You may be screened for extra fluid around the lymph nodes (lymphedema).  Plan to have someone take you home from the hospital  or clinic.  Ask your health care provider how your surgical site will be marked or identified. What happens during the procedure?  To reduce your risk of infection: ? Your health care team will wash or sanitize their hands. ? Your skin will be washed with soap.  You will be given one of the following: ? A medicine to numb the area (local anesthetic). ? A medicine to make you fall asleep (general anesthetic).  Blue dye or a radioactive substance or both will be injected around the tumor in your breast. ? The blue dye will reach your lymph node quickly. It may be given just before surgery. ? The radioactive substance will take longer to reach your lymph nodes, so it may be given before you go into the  operating area. ? Both the dye and the radioactive substance will follow the same path that a spreading cancer would be likely to follow.  If a radioactive substance was injected,a scanner will show where the substance has spread to help identify the sentinel lymph node.  The surgeon will make a small incision. If blue dye was injected, your surgeon will look for any lymph nodes that have picked up the dye.  Sentinel lymph nodes will be removed and sent to a lab for examination. ? If no cancer is found, no other lymph nodes will be removed. This means it is unlikely that the cancer has spread to other lymph nodes. ? If cancer is found, the surgeon will remove other lymph nodes in the armpit for examination. This may happen during the same procedure or at a later time.  The incision will be closed with stitches (sutures) or metal clips.  Small adhesive bandages may be used to keep the skin edges close together.  A small dressing may be taped over the incision area. The procedure may vary among health care providers and hospitals. What happens after the procedure?  Your blood pressure, heart rate, breathing rate, and blood oxygen level will be monitored until the medicines you were given  have worn off.  Your urine may be blue for the next 24 hours. This is normal. It is caused by the dye that is used during the procedure.  Your skin at the injection site may be blue for up to 8 weeks.  You may feel numbness, tingling, or pain near your incision.  You may have swelling or bruising near your incision. Summary  Sentinel lymph node biopsy is a procedure to identify, remove, and examine one or more lymph nodes for cancer.  If you have breast cancer, you may have this procedure to determine whether your cancer has spread.  If cancer is found in a sentinel lymph node, your surgeon may remove additional lymph nodes for examination. This information is not intended to replace advice given to you by your health care provider. Make sure you discuss any questions you have with your health care provider. Document Released: 02/14/2005 Document Revised: 11/04/2015 Document Reviewed: 11/04/2015 Elsevier Interactive Patient Education  2018 [employer].

## 2018-01-16 NOTE — Progress Notes (Signed)
[facility] History and Physical  Reason for Referral: Left breast cancer  Referring Physician:  Particia Nearing PA   Joanne Kramer is a 72 y.o. female.  HPI: Ms. Ihnen is a 72 yo with a newly diagnosed left breast cancer after her normal screening mammogram.  The patient has had a cyst in the area for years but this mammogram demonstrated some suspicious findings. 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 She denies any other history of lumps or bumps aside from the cyst that was adjacent to the cancer.  She had menarche at age 89, and her first pregnancy at age 33. She is G1P1. She did not breastfeed her children.  She was adopted. She underwent menopause at 77.  She has never had any previous biopsies or concerning areas on mammogram.  She has not had any chest radiation.  She does complain about having some left arm pain following her biopsies and that she had pain shooting into the arm that day.  Past Medical History:  Diagnosis Date  . Contact dermatitis    chronic itching but skin biopsy did not show anything specific  . COPD (chronic obstructive pulmonary disease) ([place])   . Emphysema of lung ([place])   . Headache   . OA (osteoarthritis) of knee    right  . Pneumonia   . Requires supplemental oxygen     Past Surgical History:  Procedure Laterality Date  . BREAST SURGERY     left breast aspiration  . CESAREAN SECTION    . COLONOSCOPY    . EYE SURGERY    . KNEE SURGERY Right   . TONSILLECTOMY    . TOTAL KNEE ARTHROPLASTY Right 06/27/2017   Procedure: RIGHT TOTAL KNEE ARTHROPLASTY;  Surgeon: Garald Balding, MD;  Location: [facility];  Service: Orthopedics;  Laterality: Right;    Family History  Adopted: Yes    Social History   Tobacco Use  . Smoking status: Current Some Day Smoker    Packs/day: 1.00    Years: 55.00    Pack years: 55.00    Types: Cigarettes    Last attempt to quit: 05/07/2017    Years since  quitting: 0.6  . Smokeless tobacco: Never Used  Substance Use Topics  . Alcohol use: No  . Drug use: No    Medications: I have reviewed the patient's current medications. Allergies as of 01/16/2018   No Known Allergies     Medication List        Accurate as of 01/16/18 10:29 AM. Always use your most recent med list.          ADVAIR DISKUS 250-50 MCG/DOSE Aepb Generic drug:  Fluticasone-Salmeterol Inhale 1 puff into the lungs 2 (two) times daily.   CALCIUM 600+D3 PO Take 1 tablet by mouth daily.   calcium carbonate 750 MG chewable tablet Commonly known as:  TUMS EX Chew 1-2 tablets by mouth 3 (three) times daily as needed (for heartburn/indigestion.).   Ipratropium-Albuterol 20-100 MCG/ACT Aers respimat Commonly known as:  COMBIVENT Inhale 1 puff into the lungs every 4 (four) hours as needed ((typically 4x's daily)).   Melatonin 5 MG Tabs Take 5 mg by mouth at bedtime as needed (for sleep.).   multivitamin with minerals Tabs tablet Take 1 tablet by mouth daily.   OXYGEN Inhale 2 L into the lungs at bedtime.   simvastatin 20 MG tablet Commonly known as:  ZOCOR TAKE 1 TABLET BY MOUTH AT BEDTIME  triamcinolone cream 0.1 % Commonly known as:  KENALOG Apply 1 application topically 2 (two) times daily as needed (for skin irritation/contact dermatitis).        ROS:  A comprehensive review of systems was negative except for: Respiratory: positive for cough, wheezing and SOB Musculoskeletal: positive for stiff joints Neurological: positive for headaches  Blood pressure (!) 150/88, pulse 94, temperature (!) 97.1 F (36.2 C), temperature source Temporal, resp. rate 18, weight 165 lb (74.8 kg). Physical Exam  Constitutional: She is oriented to person, place, and time. She appears well-developed and well-nourished.  HENT:  Head: Normocephalic and atraumatic.  Eyes: Pupils are equal, round, and reactive to light. EOM are normal.  Neck: Normal range of motion.  Neck supple.  Cardiovascular: Normal rate and regular rhythm.  Pulmonary/Chest: Effort normal and breath sounds normal. Right breast exhibits no inverted nipple, no mass, no nipple discharge, no skin change and no tenderness. Left breast exhibits mass. Left breast exhibits no inverted nipple, no nipple discharge and no skin change.  Left outer breast with 1cm area corresponding to cyst, bruising over area  Abdominal: Soft. She exhibits no distension. There is no tenderness.  Musculoskeletal: Normal range of motion. She exhibits no edema.  Lymphadenopathy:    She has no cervical adenopathy.    She has no axillary adenopathy.  Neurological: She is alert and oriented to person, place, and time.  Skin: Skin is warm and dry.  Psychiatric: She has a normal mood and affect. Her behavior is normal. Judgment and thought content normal.  Vitals reviewed.   Results: Mammogram /US 11/2017 IMPRESSION: 1. Suspicious 9 mm mass involving the UPPER OUTER QUADRANT of the LEFT breast, immediately adjacent to a longstanding benign LEFT breast cyst. 2. Solitary borderline LEFT axillary lymph node with a cortical thickness of 4 mm. 3. Stable likely benign RIGHT breast calcifications dating back to August, 2018.  RECOMMENDATION: 1. Ultrasound-guided core needle biopsy of the suspicious LEFT breast mass and the borderline LEFT axillary lymph node. 2. Diagnostic mammography in 1 year with spot magnification views of the RIGHT breast calcifications at that time.  The ultrasound core needle biopsy procedure was discussed with patient and her questions were answered. She has agreed to proceed and the biopsy has been scheduled for Tuesday, November 5 at 1 o'clock p.m.  I have discussed the findings and recommendations with the patient. Results were also provided in writing at the conclusion of the visit. If applicable, a reminder letter will be sent to the patient regarding the next  appointment.  BI-RADS CATEGORY  4: Suspicious.  Pathology: ER/PR +  Diagnosis 1. 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 SEE NOTE. 2. Lymph node, needle/core biopsy, left axilla - LYMPH NODE, NEGATIVE FOR CARCINOMA.  Assessment & Plan:  Joanne Kramer is a 72 y.o. female with a left breast cancer ER/PR+.  She is otherwise doing well and wants to get this taken care of as soon as possible.   We have discussed the options for surgery including the option of mastectomy with sentinel node biopsy versus partial mastectomy (lumpectomy) with sentinel node biopsy. We have discussed that there is no difference in the prognosis or chance or recurrence or differences in survival between the two options. We have discussed the need for radiation with the lumpectomy, and we have discussed that she will be referred to oncology after our procedure to further discuss her options for chemotherapy and hormonal therapy if she qualifies.  We have discussed that if she decides to have a lumpectomy that we will need to get a needle placed into the area where the biopsy was performed, since we cannot palpate a mass. We have also discussed the need for injection of radiotracer and blue dye to perform the sentinel node biopsy.  We have discussed that the sentinel node biopsy tells [place] if the cancer has spread to the lymph nodes, and can help with plans for chemotherapy treatment and overall prognosis.    We have discussed that if the lumpectomy does not remove the entire cancer that she may have to have an additional procedure, and we have discussed that a positive sentinel node can require further removal of lymph nodes from the axilla but that recent research does not show any improvement in disease free survival and carries greater risk for lymphedema.    We have discussed that these are big discussions, and that the risk from the operations are similar including risk of  bleeding, risk of infection, and risk of needing additional surgeries. We have discussed the likely need for an overnight stay with a mastectomy and a drain that will remain in place for about 1 week.     All questions were answered to the satisfaction of the patient and family.  Virl Cagey 01/16/2018, 10:29 AM

## 2018-01-17 ENCOUNTER — Encounter: Payer: Self-pay | Admitting: General Surgery

## 2018-01-17 NOTE — Patient Instructions (Signed)
Joanne Kramer  01/17/2018     @PREFPERIOPPHARMACY @   Your procedure is scheduled on  01/24/2018.  Report to [facility] at  700   A.M.  Call this number if you have problems the morning of surgery:  [phone]   Remember:  Do not eat or drink after midnight.                        Take these medicines the morning of surgery with A SIP OF WATER  None. Use your inhalers before you come.    Do not wear jewelry, make-up or nail polish.  Do not wear lotions, powders, or perfumes, or deodorant.  Do not shave 48 hours prior to surgery.  Men may shave face and neck.  Do not bring valuables to the hospital.  [facility] is not responsible for any belongings or valuables.  Contacts, dentures or bridgework may not be worn into surgery.  Leave your suitcase in the car.  After surgery it may be brought to your room.  For patients admitted to the hospital, discharge time will be determined by your treatment team.  Patients discharged the day of surgery will not be allowed to drive home.   Name and phone number of your driver:   family Special instructions:  None  Please read over the following fact sheets that you were given. Anesthesia Post-op Instructions and Care and Recovery After Surgery       Breast Biopsy A breast biopsy is a procedure in which a sample of suspicious breast tissue is removed from your breast. Following the procedure, the tissue or liquid that is removed from the breast is examined under a microscope to see if cancerous cells are present. You may need a breast biopsy if you have:  Any undiagnosed breast mass (tumor).  Nipple abnormalities, dimpling, crusting, or ulcerations.  Abnormal discharge from the nipple, especially blood.  Redness, swelling, and pain of the breast.  Calcium deposits (calcifications) or abnormalities seen on a mammogram, ultrasound results, or MRI results.  Suspicious changes in the breast seen on your  mammogram.  If the breast abnormality is found to be cancerous (malignant), a breast biopsy can help to determine what the best treatment is for you. There are many different types of breast biopsies. Talk with your health care provider about your options and which type is best for you. Tell a health care provider about:  Any allergies you have.  All medicines you are taking, including vitamins, herbs, eye drops, creams, and over-the-counter medicines.  Any problems you or family members have had with anesthetic medicines.  Any blood disorders you have.  Any surgeries you have had.  Any medical conditions you have.  Whether you are pregnant or may be pregnant. What are the risks? Generally, this is a safe procedure. However, problems may occur, including:  Bleeding.  Infection.  Discomfort. This is temporary.  Allergic reactions to medicines.  Bruising and swelling of the breast.  Alteration in the shape of the breast.  Damage to other tissues.  Not finding the lump or abnormality.  Needing more surgery.  What happens before the procedure?  Plan to have someone take you home after the procedure.  Do not use any tobacco products, such as cigarettes, chewing tobacco, and e-cigarettes. If you need help quitting, ask your health care provider.  Do not drink alcohol for 24  hours before the procedure.  Ask your health care provider about: ? Changing or stopping your regular medicines. This is especially important if you are taking diabetes medicines or blood thinners. ? Taking medicines such as aspirin and ibuprofen. These medicines can thin your blood. Do not take these medicines before your procedure if your health care provider instructs you not to.  Wear a good support bra to the procedure.  Ask your health care provider how your surgical site will be marked or identified.  You may be given antibiotic medicine to help prevent infection.  Your health care  provider may perform a procedure to place a wire (needle localization) or a seed that gives off radiation (radioactive seed localization) in the breast lump. A mammogram, ultrasound, MRI, or a combination of these techniques will be done during this procedure to identify the location of the breast abnormality. The imaging technique used will depend on the type of biopsy you are having. The wire or seed will help the health care provider locate the lump when performing the biopsy, especially if the lump cannot be felt. What happens during the procedure? You may be given one or both of the following:  A medicine to numb the breast area (local anesthetic).  A medicine to help you relax (sedative) during the procedure.  The following are the different types of biopsies that can be performed. Fine-Needle Aspiration A thin needle will be attached to a syringe and inserted into a breast cyst. Fluid and cells will be removed. This technique is not as common as a core needle biopsy. Core Needle Biopsy A wide, hollow needle (core needle) will be inserted into a breast lump multiple times to remove tissue samples or cores. Stereotactic Biopsy You will lie face-down on a table. Your breast will pass through an opening in the table and will be gently compressed into a fixed position. X-ray equipment and a computer will be used to locate the breast lump. The surgeon will use this information to collect several samples of tissue using a needle collection device. Vacuum-Assisted Biopsy A small incision (less than  inch) will be made in your breast. A biopsy device that includes a hollow needle and vacuum will be passed through the incision and into the breast tissue. The vacuum will gently draw abnormal breast tissue into the needle to remove it. No stitches (sutures) will be needed. The incision will be covered with a bandage (dressing). In this type of biopsy, a larger tissue sample is removed than in a regular  core needle biopsy. Ultrasound-Guided Core Needle Biopsy A high-frequency ultrasound will be used to help guide the core needle to the area of the mass or abnormality. An incision will be made to insert the needle. Then tissue samples will be removed. Surgical Biopsy This method requires an incision in the breast to remove part or all of the suspicious tissue. After the tissue is removed, the skin over the area will be closed with sutures and covered with a dressing. There are two types of surgical biopsies:  Incisional biopsy. The surgeon will remove part of the breast lump.  Excisional biopsy. The surgeon will attempt to remove the whole breast lump or as much of it as possible.  After any of these procedures, the tissue or liquid that was removed will be examined under a microscope. What happens after the procedure?  You will be taken to the recovery area. If you are doing well and have no problems, you will be  allowed to go home.  You may notice bruising on your breast. This is normal.  You may have a pressure dressing applied on your breast for 24-48 hours. A pressure dressing is a bandage that is wrapped tightly around the chest to stop fluid from collecting underneath tissues. You may also be advised to wear a supportive bra during this time.  Do not drive for 24 hours if you received a sedative. This information is not intended to replace advice given to you by your health care provider. Make sure you discuss any questions you have with your health care provider. Document Released: 02/14/2005 Document Revised: 06/25/2015 Document Reviewed: 11/18/2014 Elsevier Interactive Patient Education  2018 [employer].  Breast Biopsy, Care After These instructions give you information about caring for yourself after your procedure. Your doctor may also give you more specific instructions. Call your doctor if you have any problems or questions after your procedure. Follow these instructions at  home: Medicines  Take over-the-counter and prescription medicines only as told by your doctor.  Do not drive for 24 hours if you received a sedative.  Do not drink alcohol while taking pain medicine.  Do not drive or use heavy machinery while taking prescription pain medicine. Biopsy Site Care   Follow instructions from your doctor about how to take care of your cut from surgery (incision) or puncture area. Make sure you: ? Wash your hands with soap and water before you change your bandage. If you cannot use soap and water, use hand sanitizer. ? Change any bandages (dressings) as told by your doctor. ? Leave any stitches (sutures), skin glue, or skin tape (adhesive) strips in place. They may need to stay in place for 2 weeks or longer. If tape strips get loose and curl up, you may trim the loose edges. Do not remove tape strips completely unless your doctor says it is okay.  If you have stitches, keep them dry when you take a bath or a shower.  Check your cut or puncture area every day for signs of infection. Check for: ? More redness, swelling, or pain. ? More fluid or blood. ? Warmth. ? Pus or a bad smell.  Protect the biopsy area. Do not let the area get bumped. Activity  Avoid activities that could pull the biopsy site open. ? Avoid stretching. ? Avoid reaching. ? Avoid exercise. ? Avoid sports. ? Avoid lifting anything that is heavier than 3 pounds (1.4 kg).  Return to your normal activities as told by your doctor. Ask your doctor what activities are safe for you. General instructions  Continue your normal diet.  Wear a good support bra for as long as told by your doctor.  Get checked for extra fluid in your body (lymphedema) as often as told by your doctor.  Keep all follow-up visits as told by your doctor. This is important. Contact a health care provider if:  You have more redness, swelling, or pain at the biopsy site.  You have more fluid or blood coming  from your biopsy site.  Your biopsy site feels warm to the touch.  You have pus or a bad smell coming from the biopsy site.  Your biopsy site breaks open after the stitches, staples, or skin tape strips have been removed.  You have a rash.  You have a fever. Get help right away if:  You have more bleeding (more than a small spot) from the biopsy site.  You have trouble breathing.  You have  red streaks around the biopsy site. This information is not intended to replace advice given to you by your health care provider. Make sure you discuss any questions you have with your health care provider. Document Released: 12/11/2008 Document Revised: 10/22/2015 Document Reviewed: 11/18/2014 Elsevier Interactive Patient Education  2018 [place].  Sentinel Lymph Node Biopsy in Breast Cancer Treatment Sentinel lymph node biopsy is a procedure to identify, remove, and examine one or more lymph nodes for cancer. Lymph nodes are collections of tissue that filter infections, cancer cells, and other waste substances from the bloodstream. Cancer can spread to nearby lymph nodes. It usually spreads to one lymph node first, and then it spreads to others. The first lymph node that the cancer could spread to is called the sentinel lymph node. In some cases, there may be more than one sentinel lymph node. If you have breast cancer, you may have this procedure to determine whether your cancer has spread. For breast cancer, a sentinel lymph node is usually in your armpit because that is where breast cancer tends to spread first. If no cancer is found in a sentinel lymph node, it is very unlikely that the cancer has spread to any of the other lymph nodes. If cancer is found in a sentinel lymph node, your surgeon may remove additional lymph nodes for examination. Tell a health care provider about:  Any allergies you have.  All medicines you are taking, including vitamins, herbs, eye drops, creams, and  over-the-counter medicines.  Any problems you or family members have had with anesthetic medicines.  Any blood disorders you have.  Any surgeries you have had.  Any medical conditions you have.  Whether you are pregnant or may be pregnant. What are the risks? Generally, this is a safe procedure. However, problems may occur, including:  Infection.  Allergic reactions to medicines or dyes.  Staining of the skin where the dye is injected.  Damaged lymph vessels, causing a buildup of fluid (lymphedema).  Damage to other structures or organs.  Pain, bleeding, or bruising where a lymph node was removed (biopsy site).  A false-negative biopsy. This is when cancer cells are not found in the sentinel node, but the cancer has spread to other nodes in the area.  What happens before the procedure? Staying hydrated Follow instructions from your health care provider about hydration, which may include:  Up to 2 hours before the procedure - you may continue to drink clear liquids, such as water, clear fruit juice, black coffee, and plain tea.  Eating and drinking restrictions Follow instructions from your health care provider about eating and drinking, which may include:  8 hours before the procedure - stop eating heavy meals or foods such as meat, fried foods, or fatty foods.  6 hours before the procedure - stop eating light meals or foods, such as toast or cereal.  6 hours before the procedure - stop drinking milk or drinks that contain milk.  2 hours before the procedure - stop drinking clear liquids.  Medicines  Ask your health care provider about: ? Changing or stopping your regular medicines. This is especially important if you are taking diabetes medicines or blood thinners. ? Taking medicines such as aspirin and ibuprofen. These medicines can thin your blood. Do not take these medicines before your procedure if your health care provider instructs you not to.  You may be  given antibiotic medicine to help prevent infection. General instructions  If you smoke, stop smoking at least  2 weeks before the procedure. This will improve your health after the procedure and reduce your risk of getting a wound infection.  You may have blood tests to make sure your blood clots normally.  You may be screened for extra fluid around the lymph nodes (lymphedema).  Plan to have someone take you home from the hospital or clinic.  Ask your health care provider how your surgical site will be marked or identified. What happens during the procedure?  To reduce your risk of infection: ? Your health care team will wash or sanitize their hands. ? Your skin will be washed with soap.  You will be given one of the following: ? A medicine to numb the area (local anesthetic). ? A medicine to make you fall asleep (general anesthetic).  Blue dye or a radioactive substance or both will be injected around the tumor in your breast. ? The blue dye will reach your lymph node quickly. It may be given just before surgery. ? The radioactive substance will take longer to reach your lymph nodes, so it may be given before you go into the operating area. ? Both the dye and the radioactive substance will follow the same path that a spreading cancer would be likely to follow.  If a radioactive substance was injected,a scanner will show where the substance has spread to help identify the sentinel lymph node.  The surgeon will make a small incision. If blue dye was injected, your surgeon will look for any lymph nodes that have picked up the dye.  Sentinel lymph nodes will be removed and sent to a lab for examination. ? If no cancer is found, no other lymph nodes will be removed. This means it is unlikely that the cancer has spread to other lymph nodes. ? If cancer is found, the surgeon will remove other lymph nodes in the armpit for examination. This may happen during the same procedure or at a  later time.  The incision will be closed with stitches (sutures) or metal clips.  Small adhesive bandages may be used to keep the skin edges close together.  A small dressing may be taped over the incision area. The procedure may vary among health care providers and hospitals. What happens after the procedure?  Your blood pressure, heart rate, breathing rate, and blood oxygen level will be monitored until the medicines you were given have worn off.  Your urine may be blue for the next 24 hours. This is normal. It is caused by the dye that is used during the procedure.  Your skin at the injection site may be blue for up to 8 weeks.  You may feel numbness, tingling, or pain near your incision.  You may have swelling or bruising near your incision. Summary  Sentinel lymph node biopsy is a procedure to identify, remove, and examine one or more lymph nodes for cancer.  If you have breast cancer, you may have this procedure to determine whether your cancer has spread.  If cancer is found in a sentinel lymph node, your surgeon may remove additional lymph nodes for examination. This information is not intended to replace advice given to you by your health care provider. Make sure you discuss any questions you have with your health care provider. Document Released: 02/14/2005 Document Revised: 11/04/2015 Document Reviewed: 11/04/2015 Elsevier Interactive Patient Education  2018 [place] Lymph Node Biopsy, Care After Refer to this sheet in the next few weeks. These instructions provide you with information on  caring for yourself after your procedure. Your health care provider may also give you more specific instructions. Your treatment has been planned according to current medical practices, but problems sometimes occur. Call your health care provider if you have any problems or questions after your procedure. What can I expect after the procedure? After your procedure, it is  typical to have the following:  Your urine may be blue for the next 24 hours. This is normal. It is caused by the dye used during the procedure.  Your skin at the injection site may be blue for up to 8 weeks.  You may feel numbness, tingling, or pain near your surgical incision.  You may have swelling or bruising near your incision.  Follow these instructions at home:  Avoid vigorous exercise. Ask your health care provider when you can return to your normal activities.  You may shower 24 hours after your procedure. It is okay to get your incision wet. Pat the area dry with a clean towel. Do not rub the incision because this may cause bleeding.  Women who are given a surgical bra should wear it for the next 48 hours. The bra may be removed to shower.  There are many different ways to close and cover an incision, including stitches, skin glue, and adhesive strips. Follow your health care provider's instructions on: ? Incision care. ? Bandage (dressing) changes and removal. ? Incision closure removal.  Take medicines only as directed by your health care provider.  You may resume your regular diet.  Do not have your blood pressure taken in the arm on the side of the biopsy until your health care provider says it is okay.  Keep all follow-up visits as directed by your health care provider. This is important. Contact a health care provider if:  Your pain medicine is not helping.  You have nausea and vomiting.  You have any new swelling, bruising, or redness.  You have chills or a fever. Get help right away if:  You have pain that is getting worse, and your medicine is not helping.  You have redness, swelling, or tenderness that is getting worse.  You have bleeding or drainage from your incision site.  You have vomiting that will not stop.  You have chest pain or trouble breathing. This information is not intended to replace advice given to you by your health care provider.  Make sure you discuss any questions you have with your health care provider. Document Released: 09/29/2003 Document Revised: 07/23/2015 Document Reviewed: 04/05/2013 Elsevier Interactive Patient Education  2018 [place] Anesthesia, Adult General anesthesia is the use of medicines to make a person "go to sleep" (be unconscious) for a medical procedure. General anesthesia is often recommended when a procedure:  Is long.  Requires you to be still or in an unusual position.  Is major and can cause you to lose blood.  Is impossible to do without general anesthesia.  The medicines used for general anesthesia are called general anesthetics. In addition to making you sleep, the medicines:  Prevent pain.  Control your blood pressure.  Relax your muscles.  Tell a health care provider about:  Any allergies you have.  All medicines you are taking, including vitamins, herbs, eye drops, creams, and over-the-counter medicines.  Any problems you or family members have had with anesthetic medicines.  Types of anesthetics you have had in the past.  Any bleeding disorders you have.  Any surgeries you have had.  Any  medical conditions you have.  Any history of heart or lung conditions, such as heart failure, sleep apnea, or chronic obstructive pulmonary disease (COPD).  Whether you are pregnant or may be pregnant.  Whether you use tobacco, alcohol, marijuana, or street drugs.  Any history of Armed forces logistics/support/administrative officer.  Any history of depression or anxiety. What are the risks? Generally, this is a safe procedure. However, problems may occur, including:  Allergic reaction to anesthetics.  Lung and heart problems.  Inhaling food or liquids from your stomach into your lungs (aspiration).  Injury to nerves.  Waking up during your procedure and being unable to move (rare).  Extreme agitation or a state of mental confusion (delirium) when you wake up from the  anesthetic.  Air in the bloodstream, which can lead to stroke.  These problems are more likely to develop if you are having a major surgery or if you have an advanced medical condition. You can prevent some of these complications by answering all of your health care provider's questions thoroughly and by following all pre-procedure instructions. General anesthesia can cause side effects, including:  Nausea or vomiting  A sore throat from the breathing tube.  Feeling cold or shivery.  Feeling tired, washed out, or achy.  Sleepiness or drowsiness.  Confusion or agitation.  What happens before the procedure? Staying hydrated Follow instructions from your health care provider about hydration, which may include:  Up to 2 hours before the procedure - you may continue to drink clear liquids, such as water, clear fruit juice, black coffee, and plain tea.  Eating and drinking restrictions Follow instructions from your health care provider about eating and drinking, which may include:  8 hours before the procedure - stop eating heavy meals or foods such as meat, fried foods, or fatty foods.  6 hours before the procedure - stop eating light meals or foods, such as toast or cereal.  6 hours before the procedure - stop drinking milk or drinks that contain milk.  2 hours before the procedure - stop drinking clear liquids.  Medicines  Ask your health care provider about: ? Changing or stopping your regular medicines. This is especially important if you are taking diabetes medicines or blood thinners. ? Taking medicines such as aspirin and ibuprofen. These medicines can thin your blood. Do not take these medicines before your procedure if your health care provider instructs you not to. ? Taking new dietary supplements or medicines. Do not take these during the week before your procedure unless your health care provider approves them.  If you are told to take a medicine or to continue  taking a medicine on the day of the procedure, take the medicine with sips of water. General instructions   Ask if you will be going home the same day, the following day, or after a longer hospital stay. ? Plan to have someone take you home. ? Plan to have someone stay with you for the first 24 hours after you leave the hospital or clinic.  For 3-6 weeks before the procedure, try not to use any tobacco products, such as cigarettes, chewing tobacco, and e-cigarettes.  You may brush your teeth on the morning of the procedure, but make sure to spit out the toothpaste. What happens during the procedure?  You will be given anesthetics through a mask and through an IV tube in one of your veins.  You may receive medicine to help you relax (sedative).  As soon as you are asleep, a  breathing tube may be used to help you breathe.  An anesthesia specialist will stay with you throughout the procedure. He or she will help keep you comfortable and safe by continuing to give you medicines and adjusting the amount of medicine that you get. He or she will also watch your blood pressure, pulse, and oxygen levels to make sure that the anesthetics do not cause any problems.  If a breathing tube was used to help you breathe, it will be removed before you wake up. The procedure may vary among health care providers and hospitals. What happens after the procedure?  You will wake up, often slowly, after the procedure is complete, usually in a recovery area.  Your blood pressure, heart rate, breathing rate, and blood oxygen level will be monitored until the medicines you were given have worn off.  You may be given medicine to help you calm down if you feel anxious or agitated.  If you will be going home the same day, your health care provider may check to make sure you can stand, drink, and urinate.  Your health care providers will treat your pain and side effects before you go home.  Do not drive for 24  hours if you received a sedative.  You may: ? Feel nauseous and vomit. ? Have a sore throat. ? Have mental slowness. ? Feel cold or shivery. ? Feel sleepy. ? Feel tired. ? Feel sore or achy, even in parts of your body where you did not have surgery. This information is not intended to replace advice given to you by your health care provider. Make sure you discuss any questions you have with your health care provider. Document Released: 05/24/2007 Document Revised: 07/28/2015 Document Reviewed: 01/29/2015 Elsevier Interactive Patient Education  2018 [place] [facility]hesia, Adult, Care After These instructions provide you with information about caring for yourself after your procedure. Your health care provider may also give you more specific instructions. Your treatment has been planned according to current medical practices, but problems sometimes occur. Call your health care provider if you have any problems or questions after your procedure. What can I expect after the procedure? After the procedure, it is common to have:  Vomiting.  A sore throat.  Mental slowness.  It is common to feel:  Nauseous.  Cold or shivery.  Sleepy.  Tired.  Sore or achy, even in parts of your body where you did not have surgery.  Follow these instructions at home: For at least 24 hours after the procedure:  Do not: ? Participate in activities where you could fall or become injured. ? Drive. ? Use heavy machinery. ? Drink alcohol. ? Take sleeping pills or medicines that cause drowsiness. ? Make important decisions or sign legal documents. ? Take care of children on your own.  Rest. Eating and drinking  If you vomit, drink water, juice, or soup when you can drink without vomiting.  Drink enough fluid to keep your urine clear or pale yellow.  Make sure you have little or no nausea before eating solid foods.  Follow the diet recommended by your health care  provider. General instructions  Have a responsible adult stay with you until you are awake and alert.  Return to your normal activities as told by your health care provider. Ask your health care provider what activities are safe for you.  Take over-the-counter and prescription medicines only as told by your health care provider.  If you smoke, do not smoke without  supervision.  Keep all follow-up visits as told by your health care provider. This is important. Contact a health care provider if:  You continue to have nausea or vomiting at home, and medicines are not helpful.  You cannot drink fluids or start eating again.  You cannot urinate after 8-12 hours.  You develop a skin rash.  You have fever.  You have increasing redness at the site of your procedure. Get help right away if:  You have difficulty breathing.  You have chest pain.  You have unexpected bleeding.  You feel that you are having a life-threatening or urgent problem. This information is not intended to replace advice given to you by your health care provider. Make sure you discuss any questions you have with your health care provider. Document Released: 05/23/2000 Document Revised: 07/20/2015 Document Reviewed: 01/29/2015 Elsevier Interactive Patient Education  [employer].

## 2018-01-18 ENCOUNTER — Encounter: Payer: Self-pay | Admitting: Nurse Practitioner

## 2018-01-18 ENCOUNTER — Ambulatory Visit (INDEPENDENT_AMBULATORY_CARE_PROVIDER_SITE_OTHER): Payer: Medicare Other | Admitting: Nurse Practitioner

## 2018-01-18 VITALS — BP 114/79 | HR 82 | Temp 97.1°F | Ht 64.0 in | Wt 167.0 lb

## 2018-01-18 DIAGNOSIS — G608 Other hereditary and idiopathic neuropathies: Secondary | ICD-10-CM | POA: Diagnosis not present

## 2018-01-18 MED ORDER — PREDNISONE 10 MG (21) PO TBPK: ORAL_TABLET | ORAL | 0 refills | Status: DC

## 2018-01-18 NOTE — Progress Notes (Signed)
   Subjective:    Patient ID: Joanne Kramer, female    DOB: [birth year], 72 y.o.   MRN: [id number]   Chief Complaint: Left upper arm pain (Had breast biopsy 2 weeks ago and they hit a nerve)   HPI Patient had left breast bx 2 weeks ago and while they were doing it they hit a nerve and pain shot down left arm. She is still having what she calls nerve pain. She describes the pain as a hot tingly pain. Is constant. Nothing makes it worse or better. Rates pain 5-8/10 . Denies any weakness in that ext or hand.   Review of Systems  Constitutional: Negative.   Respiratory: Negative.   Cardiovascular: Negative.   Neurological: Negative.   Psychiatric/Behavioral: Negative.   All other systems reviewed and are negative.      Objective:   Physical Exam  Constitutional: She appears well-developed and well-nourished. No distress.  Cardiovascular: Regular rhythm.  Pulmonary/Chest: Effort normal.  Musculoskeletal:  Left arm tender to touch all the way up. Worse along left medial epicondyle. Grips equal bil DTR's equal bil  Neurological: She is alert.  Skin: Skin is warm.  Psychiatric: She has a normal mood and affect. Her behavior is normal. Thought content normal.  Nursing note and vitals reviewed.  BP 114/79   Pulse 82   Temp (!) 97.1 F (36.2 C) (Oral)   Ht 5\' 4"  (1.626 m)   Wt 167 lb (75.8 kg)   BMI 28.67 kg/m        Assessment & Plan:  Joanne Kramer in today with chief complaint of Left upper arm pain (Had breast biopsy 2 weeks ago and they hit a nerve)   1. Acute sensory neuropathy Ice RTO prn - predniSONE (STERAPRED UNI-PAK 21 TAB) 10 MG (21) TBPK tablet; As directed x 6 days  Dispense: 21 tablet; Refill: 0  Mary-Margaret Hassell Done, FNP

## 2018-01-18 NOTE — Patient Instructions (Signed)
Neuropathic Pain Neuropathic pain is pain caused by damage to the nerves that are responsible for certain sensations in your body (sensory nerves). The pain can be caused by damage to:  The sensory nerves that send signals to your spinal cord and brain (peripheral nervous system).  The sensory nerves in your brain or spinal cord (central nervous system).  Neuropathic pain can make you more sensitive to pain. What would be a minor sensation for most people may feel very painful if you have neuropathic pain. This is usually a long-term condition that can be difficult to treat. The type of pain can differ from person to person. It may start suddenly (acute), or it may develop slowly and last for a long time (chronic). Neuropathic pain may come and go as damaged nerves heal or may stay at the same level for years. It often causes emotional distress, loss of sleep, and a lower quality of life. What are the causes? The most common cause of damage to a sensory nerve is diabetes. Many other diseases and conditions can also cause neuropathic pain. Causes of neuropathic pain can be classified as:  Toxic. Many drugs and chemicals can cause toxic damage. The most common cause of toxic neuropathic pain is damage from drug treatment for cancer (chemotherapy).  Metabolic. This type of pain can happen when a disease causes imbalances that damage nerves. Diabetes is the most common of these diseases. Vitamin B deficiency caused by long-term alcohol abuse is another common cause.  Traumatic. Any injury that cuts, crushes, or stretches a nerve can cause damage and pain. A common example is feeling pain after losing an arm or leg (phantom limb pain).  Compression-related. If a sensory nerve gets trapped or compressed for a long period of time, the blood supply to the nerve can be cut off.  Vascular. Many blood vessel diseases can cause neuropathic pain by decreasing blood supply and oxygen to nerves.  Autoimmune.  This type of pain results from diseases in which the body's defense system mistakenly attacks sensory nerves. Examples of autoimmune diseases that can cause neuropathic pain include lupus and multiple sclerosis.  Infectious. Many types of viral infections can damage sensory nerves and cause pain. Shingles infection is a common cause of this type of pain.  Inherited. Neuropathic pain can be a symptom of many diseases that are passed down through families (genetic).  What are the signs or symptoms? The main symptom is pain. Neuropathic pain is often described as:  Burning.  Shock-like.  Stinging.  Hot or cold.  Itching.  How is this diagnosed? No single test can diagnose neuropathic pain. Your health care provider will do a physical exam and ask you about your pain. You may use a pain scale to describe how bad your pain is. You may also have tests to see if you have a high sensitivity to pain and to help find the cause and location of any sensory nerve damage. These tests may include:  Imaging studies, such as: ? X-rays. ? CT scan. ? MRI.  Nerve conduction studies to test how well nerve signals travel through your sensory nerves (electrodiagnostic testing).  Stimulating your sensory nerves through electrodes on your skin and measuring the response in your spinal cord and brain (somatosensory evoked potentials).  How is this treated? Treatment for neuropathic pain may change over time. You may need to try different treatment options or a combination of treatments. Some options include:  Over-the-counter pain relievers.  Prescription medicines. Some medicines   used to treat other conditions may also help neuropathic pain. These include medicines to: ? Control seizures (anticonvulsants). ? Relieve depression (antidepressants).  Prescription-strength pain relievers (narcotics). These are usually used when other pain relievers do not help.  Transcutaneous nerve stimulation (TENS).  This uses electrical currents to block painful nerve signals. The treatment is painless.  Topical and local anesthetics. These are medicines that numb the nerves. They can be injected as a nerve block or applied to the skin.  Alternative treatments, such as: ? Acupuncture. ? Meditation. ? Massage. ? Physical therapy. ? Pain management programs. ? Counseling.  Follow these instructions at home:  Learn as much as you can about your condition.  Take medicines only as directed by your health care provider.  Work closely with all your health care providers to find what works best for you.  Have a good support system at home.  Consider joining a chronic pain support group. Contact a health care provider if:  Your pain treatments are not helping.  You are having side effects from your medicines.  You are struggling with fatigue, mood changes, depression, or anxiety. This information is not intended to replace advice given to you by your health care provider. Make sure you discuss any questions you have with your health care provider. Document Released: 11/12/2003 Document Revised: 09/04/2015 Document Reviewed: 07/25/2013 Elsevier Interactive Patient Education  2018 [employer]vier Inc[employer]  

## 2018-01-19 ENCOUNTER — Encounter (HOSPITAL_COMMUNITY): Payer: Self-pay

## 2018-01-19 ENCOUNTER — Encounter (HOSPITAL_COMMUNITY)
Admission: RE | Admit: 2018-01-19 | Discharge: 2018-01-19 | Disposition: A | Discharge status: Home / Self Care | Payer: Medicare Other | Source: Ambulatory Visit | Attending: General Surgery | Admitting: General Surgery

## 2018-01-24 ENCOUNTER — Encounter (HOSPITAL_COMMUNITY): Payer: Self-pay

## 2018-01-24 ENCOUNTER — Encounter (HOSPITAL_COMMUNITY): Payer: Medicare Other

## 2018-01-24 ENCOUNTER — Ambulatory Visit (HOSPITAL_COMMUNITY): Admit: 2018-01-24 | Payer: Medicare Other | Admitting: General Surgery

## 2018-01-24 ENCOUNTER — Other Ambulatory Visit (HOSPITAL_COMMUNITY): Payer: Medicare Other

## 2018-01-24 SURGERY — PARTIAL MASTECTOMY WITH NEEDLE LOCALIZATION AND AXILLARY SENTINEL LYMPH NODE BX
Anesthesia: General | Laterality: Left

## 2018-01-27 DIAGNOSIS — J449 Chronic obstructive pulmonary disease, unspecified: Secondary | ICD-10-CM | POA: Diagnosis not present

## 2018-01-29 DIAGNOSIS — C50412 Malignant neoplasm of upper-outer quadrant of left female breast: Secondary | ICD-10-CM | POA: Diagnosis not present

## 2018-02-07 ENCOUNTER — Other Ambulatory Visit: Payer: Self-pay | Admitting: General Surgery

## 2018-02-07 DIAGNOSIS — Z170 Estrogen receptor positive status [ER+]: Principal | ICD-10-CM

## 2018-02-07 DIAGNOSIS — C50412 Malignant neoplasm of upper-outer quadrant of left female breast: Secondary | ICD-10-CM

## 2018-02-09 DIAGNOSIS — J449 Chronic obstructive pulmonary disease, unspecified: Secondary | ICD-10-CM | POA: Diagnosis not present

## 2018-02-09 DIAGNOSIS — C50412 Malignant neoplasm of upper-outer quadrant of left female breast: Secondary | ICD-10-CM | POA: Diagnosis not present

## 2018-02-12 ENCOUNTER — Other Ambulatory Visit: Payer: Self-pay | Admitting: General Surgery

## 2018-02-13 ENCOUNTER — Other Ambulatory Visit: Payer: Self-pay | Admitting: General Surgery

## 2018-02-13 DIAGNOSIS — C50412 Malignant neoplasm of upper-outer quadrant of left female breast: Secondary | ICD-10-CM

## 2018-02-13 DIAGNOSIS — Z170 Estrogen receptor positive status [ER+]: Principal | ICD-10-CM

## 2018-02-16 ENCOUNTER — Encounter: Payer: Self-pay | Admitting: Physician Assistant

## 2018-02-16 ENCOUNTER — Ambulatory Visit (INDEPENDENT_AMBULATORY_CARE_PROVIDER_SITE_OTHER): Payer: Medicare Other | Admitting: Physician Assistant

## 2018-02-16 VITALS — BP 110/76 | HR 86 | Temp 97.8°F | Ht 64.0 in | Wt 164.0 lb

## 2018-02-16 DIAGNOSIS — G44099 Other trigeminal autonomic cephalgias (TAC), not intractable: Secondary | ICD-10-CM | POA: Diagnosis not present

## 2018-02-16 DIAGNOSIS — E7800 Pure hypercholesterolemia, unspecified: Secondary | ICD-10-CM

## 2018-02-16 DIAGNOSIS — J209 Acute bronchitis, unspecified: Secondary | ICD-10-CM

## 2018-02-16 DIAGNOSIS — G629 Polyneuropathy, unspecified: Secondary | ICD-10-CM

## 2018-02-16 MED ORDER — CEFDINIR 300 MG PO CAPS: 300.0000 mg | ORAL_CAPSULE | Freq: Two times a day (BID) | ORAL | 0 refills | Status: DC

## 2018-02-16 MED ORDER — SIMVASTATIN 20 MG PO TABS: 20.0000 mg | ORAL_TABLET | Freq: Every day | ORAL | 1 refills | Status: DC

## 2018-02-16 MED ORDER — GABAPENTIN 100 MG PO CAPS: 100.0000 mg | ORAL_CAPSULE | Freq: Three times a day (TID) | ORAL | 5 refills | Status: DC

## 2018-02-18 DIAGNOSIS — G629 Polyneuropathy, unspecified: Secondary | ICD-10-CM | POA: Insufficient documentation

## 2018-02-18 NOTE — Progress Notes (Signed)
BP 110/76   Pulse 86   Temp 97.8 F (36.6 C) (Oral)   Ht 5\' 4"  (1.626 m)   Wt 164 lb (74.4 kg)   BMI 28.15 kg/m    Subjective:    Patient ID: Joanne Kramer, female    DOB: 1946/01/21, 72 y.o.   MRN: [id number]  HPI: Joanne Kramer is a 72 y.o. 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  She has not had any issues with any of her medications.  She is having a breast procedure due to her cancer.  And she hopes that the surgery is all there can have today.  Patient with several days of progressing upper respiratory and bronchial symptoms. Initially there was more upper respiratory congestion. This progressed to having significant cough that is productive throughout the day and severe at night. There is occasional wheezing after coughing. Sometimes there is slight dyspnea on exertion. It is productive mucus that is yellow in color. Denies any blood.   All medications are reviewed today. There are no reports of any problems with the medications. All of the medical conditions are reviewed and updated.  Lab work is reviewed and will be ordered as medically necessary. There are no new problems reported with today's visit.   Past Medical History:  Diagnosis Date  . Contact dermatitis    chronic itching but skin biopsy did not show anything specific  . COPD (chronic obstructive pulmonary disease) ([place])   . Emphysema of lung ([place])   . Headache   . OA (osteoarthritis) of knee    right  . Pneumonia   . Requires supplemental oxygen    Relevant past medical, surgical, family and social history reviewed and updated as indicated. Interim medical history since our last visit reviewed. Allergies and medications reviewed and updated. DATA REVIEWED: CHART IN EPIC  Family History reviewed for  pertinent findings.  Review of Systems  Constitutional: Positive for chills, fatigue and fever. Negative for activity change and appetite change.  HENT: Positive for congestion, postnasal drip and sore throat.   Eyes: Negative.   Respiratory: Positive for cough. Negative for wheezing.   Cardiovascular: Negative.  Negative for chest pain, palpitations and leg swelling.  Gastrointestinal: Negative.   Genitourinary: Negative.   Musculoskeletal: Negative.   Skin: Negative.   Neurological: Positive for headaches.    Allergies as of 02/16/2018   No Known Allergies     Medication List       Accurate as of February 16, 2018 11:59 PM. Always use your most recent med list.        ADVAIR DISKUS 250-50 MCG/DOSE Aepb Generic drug:  Fluticasone-Salmeterol Inhale 1 puff into the lungs 2 (two) times daily.   CALCIUM 600+D3 PO Take 1 tablet by mouth daily.   calcium carbonate 750 MG chewable tablet Commonly known as:  TUMS EX Chew 1-2 tablets by mouth 3 (three) times daily as needed (for heartburn/indigestion.).   cefdinir 300 MG capsule Commonly known as:  OMNICEF Take 1 capsule (300 mg total) by mouth 2 (two) times daily. 1 po BID   gabapentin 100 MG capsule Commonly known as:  NEURONTIN Take 1-3 capsules (100-300 mg total) by mouth 3 (three) times daily.   Ipratropium-Albuterol 20-100 MCG/ACT Aers respimat Commonly known as:  COMBIVENT RESPIMAT Inhale 1 puff into the lungs every 4 (four) hours  as needed ((typically 4x's daily)).   Melatonin 5 MG Tabs Take 5 mg by mouth at bedtime as needed (for sleep.).   multivitamin with minerals Tabs tablet Take 1 tablet by mouth daily.   OXYGEN Inhale 2 L into the lungs at bedtime.   simvastatin 20 MG tablet Commonly known as:  ZOCOR Take 1 tablet (20 mg total) by mouth at bedtime.   triamcinolone cream 0.1 % Commonly known as:  KENALOG Apply 1 application topically 2 (two) times daily as needed (for skin irritation/contact  dermatitis).          Objective:    BP 110/76   Pulse 86   Temp 97.8 F (36.6 C) (Oral)   Ht 5\' 4"  (1.626 m)   Wt 164 lb (74.4 kg)   BMI 28.15 kg/m   No Known Allergies  Wt Readings from Last 3 Encounters:  02/16/18 164 lb (74.4 kg)  01/18/18 167 lb (75.8 kg)  01/16/18 165 lb (74.8 kg)    Physical Exam Constitutional:      Appearance: She is well-developed.  HENT:     Head: Normocephalic and atraumatic.     Right Ear: Drainage and tenderness present.     Left Ear: Drainage and tenderness present.     Nose: Mucosal edema and rhinorrhea present.     Right Sinus: No maxillary sinus tenderness or frontal sinus tenderness.     Left Sinus: No maxillary sinus tenderness or frontal sinus tenderness.     Mouth/Throat:     Pharynx: Oropharyngeal exudate and posterior oropharyngeal erythema present.  Eyes:     Conjunctiva/sclera: Conjunctivae normal.     Pupils: Pupils are equal, round, and reactive to light.  Neck:     Musculoskeletal: Normal range of motion and neck supple.  Cardiovascular:     Rate and Rhythm: Normal rate and regular rhythm.     Heart sounds: Normal heart sounds.  Pulmonary:     Effort: Pulmonary effort is normal.     Breath sounds: Examination of the right-upper field reveals wheezing. Examination of the left-upper field reveals wheezing. Wheezing present.  Abdominal:     General: Bowel sounds are normal.     Palpations: Abdomen is soft.  Skin:    General: Skin is warm and dry.     Findings: No rash.  Neurological:     Mental Status: She is alert and oriented to person, place, and time.     Deep Tendon Reflexes: Reflexes are normal and symmetric.  Psychiatric:        Behavior: Behavior normal.        Thought Content: Thought content normal.        Judgment: Judgment normal.     Results for orders placed or performed during the hospital encounter of 06/27/17  CBC  Result Value Ref Range   WBC 12.2 (H) 4.0 - 10.5 K/uL   RBC 3.60 (L) 3.87 -  5.11 MIL/uL   Hemoglobin 10.6 (L) 12.0 - 15.0 g/dL   HCT 35.3 (L) 36.0 - 46.0 %   MCV 98.1 78.0 - 100.0 fL   MCH 29.4 26.0 - 34.0 pg   MCHC 30.0 30.0 - 36.0 g/dL   RDW 14.5 11.5 - 15.5 %   Platelets 197 150 - 400 K/uL  Basic metabolic panel  Result Value Ref Range   Sodium 137 135 - 145 mmol/L   Potassium 4.3 3.5 - 5.1 mmol/L   Chloride 103 101 - 111 mmol/L   CO2 30 22 -  32 mmol/L   Glucose, Bld 141 (H) 65 - 99 mg/dL   BUN 9 6 - 20 mg/dL   Creatinine, Ser 0.57 0.44 - 1.00 mg/dL   Calcium 8.2 (L) 8.9 - 10.3 mg/dL   GFR calc non Af Amer >60 >60 mL/min   GFR calc Af Amer >60 >60 mL/min   Anion gap 4 (L) 5 - 15  CBC  Result Value Ref Range   WBC 11.8 (H) 4.0 - 10.5 K/uL   RBC 3.53 (L) 3.87 - 5.11 MIL/uL   Hemoglobin 10.5 (L) 12.0 - 15.0 g/dL   HCT 34.4 (L) 36.0 - 46.0 %   MCV 97.5 78.0 - 100.0 fL   MCH 29.7 26.0 - 34.0 pg   MCHC 30.5 30.0 - 36.0 g/dL   RDW 15.0 11.5 - 15.5 %   Platelets 198 150 - 400 K/uL  Basic metabolic panel  Result Value Ref Range   Sodium 138 135 - 145 mmol/L   Potassium 4.2 3.5 - 5.1 mmol/L   Chloride 99 (L) 101 - 111 mmol/L   CO2 32 22 - 32 mmol/L   Glucose, Bld 101 (H) 65 - 99 mg/dL   BUN 11 6 - 20 mg/dL   Creatinine, Ser 0.61 0.44 - 1.00 mg/dL   Calcium 8.5 (L) 8.9 - 10.3 mg/dL   GFR calc non Af Amer >60 >60 mL/min   GFR calc Af Amer >60 >60 mL/min   Anion gap 7 5 - 15  CBC  Result Value Ref Range   WBC 10.5 4.0 - 10.5 K/uL   RBC 3.47 (L) 3.87 - 5.11 MIL/uL   Hemoglobin 10.2 (L) 12.0 - 15.0 g/dL   HCT 33.7 (L) 36.0 - 46.0 %   MCV 97.1 78.0 - 100.0 fL   MCH 29.4 26.0 - 34.0 pg   MCHC 30.3 30.0 - 36.0 g/dL   RDW 14.7 11.5 - 15.5 %   Platelets 192 150 - 400 K/uL  Basic metabolic panel  Result Value Ref Range   Sodium 138 135 - 145 mmol/L   Potassium 4.0 3.5 - 5.1 mmol/L   Chloride 95 (L) 101 - 111 mmol/L   CO2 33 (H) 22 - 32 mmol/L   Glucose, Bld 92 65 - 99 mg/dL   BUN 8 6 - 20 mg/dL   Creatinine, Ser 0.53 0.44 - 1.00 mg/dL   Calcium  8.8 (L) 8.9 - 10.3 mg/dL   GFR calc non Af Amer >60 >60 mL/min   GFR calc Af Amer >60 >60 mL/min   Anion gap 10 5 - 15      Assessment & Plan:   1. Neuropathy - gabapentin (NEURONTIN) 100 MG capsule; Take 1-3 capsules (100-300 mg total) by mouth 3 (three) times daily.  Dispense: 90 capsule; Refill: 5  2. Other trigeminal autonomic cephalgia (TAC), not intractable - gabapentin (NEURONTIN) 100 MG capsule; Take 1-3 capsules (100-300 mg total) by mouth 3 (three) times daily.  Dispense: 90 capsule; Refill: 5  3. Pure hypercholesterolemia - simvastatin (ZOCOR) 20 MG tablet; Take 1 tablet (20 mg total) by mouth at bedtime.  Dispense: 90 tablet; Refill: 1  4. Bronchitis, acute, with bronchospasm - cefdinir (OMNICEF) 300 MG capsule; Take 1 capsule (300 mg total) by mouth 2 (two) times daily. 1 po BID  Dispense: 20 capsule; Refill: 0   Continue all other maintenance medications as listed above.  Follow up plan: Return in about 4 months (around 06/18/2018) for recheck.  Educational handout given for survey  Terald Sleeper PA-C [facility] [address]  [place],  [address] [phone]   02/18/2018, 8:59 PM

## 2018-02-19 NOTE — Pre-Procedure Instructions (Addendum)
Joanne Kramer  02/19/2018      [id number] - [place], [place] - [address] [place] [address] Phone: [phone] Fax: [phone]    Your procedure is scheduled on January 6th.  Report to [facility] Admitting at 1:00 P.M.  Call this number if you have problems the morning of surgery:  [phone]   Remember:  Do not eat  after midnight.  You may drink clear liquids up to 3 hours prior to surgery.  Clear liquids include: water, gatorade, black coffee only, clear tea.  Stop all clear liquids at 12:00pm day of surgery.  Finish Ensure drink 3 hours prior to surgery.  Do not sip.  Drink all at one sitting.     Take these medicines the morning of surgery with A SIP OF WATER   Fluticasone-Salmeterol  Gabapentin (Neurontin)  Ipratropium-Albuterol - if needed   7 days prior to surgery STOP taking any Aspirin (unless otherwise instructed by your surgeon), Aleve, Naproxen, Ibuprofen, Motrin, Advil, Goody's, BC's, all herbal medications, fish oil, and all vitamins.     Do not wear jewelry, make-up or nail polish.  Do not wear lotions, powders, or perfumes, or deodorant.  Do not shave 48 hours prior to surgery.  Men may shave face and neck.  Do not bring valuables to the hospital.  [facility] is not responsible for any belongings or valuables.    [facility]- Preparing For Surgery  Before surgery, you can play an important role. Because skin is not sterile, your skin needs to be as free of germs as possible. You can reduce the number of germs on your skin by washing with CHG (chlorahexidine gluconate) Soap before surgery.  CHG is an antiseptic cleaner which kills germs and bonds with the skin to continue killing germs even after washing.    Oral Hygiene is also important to reduce your risk of infection.  Remember - BRUSH YOUR TEETH THE MORNING OF SURGERY WITH YOUR REGULAR TOOTHPASTE  Please do not use if you have an allergy  to CHG or antibacterial soaps. If your skin becomes reddened/irritated stop using the CHG.  Do not shave (including legs and underarms) for at least 48 hours prior to first CHG shower. It is OK to shave your face.  Please follow these instructions carefully.   1. Shower the NIGHT BEFORE SURGERY and the MORNING OF SURGERY with CHG.   2. If you chose to wash your hair, wash your hair first as usual with your normal shampoo.  3. After you shampoo, rinse your hair and body thoroughly to remove the shampoo.  4. Use CHG as you would any other liquid soap. You can apply CHG directly to the skin and wash gently with a scrungie or a clean washcloth.   5. Apply the CHG Soap to your body ONLY FROM THE NECK DOWN.  Do not use on open wounds or open sores. Avoid contact with your eyes, ears, mouth and genitals (private parts). Wash Face and genitals (private parts)  with your normal soap.  6. Wash thoroughly, paying special attention to the area where your surgery will be performed.  7. Thoroughly rinse your body with warm water from the neck down.  8. DO NOT shower/wash with your normal soap after using and rinsing off the CHG Soap.  9. Pat yourself dry with a CLEAN TOWEL.  10. Wear CLEAN PAJAMAS to bed the night before surgery, wear comfortable clothes the morning  of surgery  11. Place CLEAN SHEETS on your bed the night of your first shower and DO NOT SLEEP WITH PETS.    Day of Surgery:  Do not apply any deodorants/lotions.  Please wear clean clothes to the hospital/surgery center.   Remember to brush your teeth WITH YOUR REGULAR TOOTHPASTE.    Contacts, dentures or bridgework may not be worn into surgery.  Leave your suitcase in the car.  After surgery it may be brought to your room.  For patients admitted to the hospital, discharge time will be determined by your treatment team.  Patients discharged the day of surgery will not be allowed to drive home.

## 2018-02-20 ENCOUNTER — Encounter (HOSPITAL_COMMUNITY)
Admission: RE | Admit: 2018-02-20 | Discharge: 2018-02-20 | Disposition: A | Discharge status: Home / Self Care | Payer: Medicare Other | Source: Ambulatory Visit | Attending: General Surgery | Admitting: General Surgery

## 2018-02-20 ENCOUNTER — Encounter (HOSPITAL_COMMUNITY): Payer: Self-pay

## 2018-02-20 ENCOUNTER — Other Ambulatory Visit: Payer: Self-pay

## 2018-02-20 DIAGNOSIS — Z79899 Other long term (current) drug therapy: Secondary | ICD-10-CM | POA: Diagnosis not present

## 2018-02-20 DIAGNOSIS — Z96651 Presence of right artificial knee joint: Secondary | ICD-10-CM | POA: Insufficient documentation

## 2018-02-20 DIAGNOSIS — I700 Atherosclerosis of aorta: Secondary | ICD-10-CM | POA: Insufficient documentation

## 2018-02-20 DIAGNOSIS — C50912 Malignant neoplasm of unspecified site of left female breast: Secondary | ICD-10-CM | POA: Insufficient documentation

## 2018-02-20 DIAGNOSIS — Z01812 Encounter for preprocedural laboratory examination: Secondary | ICD-10-CM | POA: Diagnosis not present

## 2018-02-20 DIAGNOSIS — J449 Chronic obstructive pulmonary disease, unspecified: Secondary | ICD-10-CM | POA: Diagnosis not present

## 2018-02-20 LAB — CBC
HCT: 46.7 % — ABNORMAL HIGH (ref 36.0–46.0)
Hemoglobin: 14.1 g/dL (ref 12.0–15.0)
MCH: 29.9 pg (ref 26.0–34.0)
MCHC: 30.2 g/dL (ref 30.0–36.0)
MCV: 99.2 fL (ref 80.0–100.0)
Platelets: 206 10*3/uL (ref 150–400)
RBC: 4.71 MIL/uL (ref 3.87–5.11)
RDW: 14 % (ref 11.5–15.5)
WBC: 7.1 10*3/uL (ref 4.0–10.5)
nRBC: 0 % (ref 0.0–0.2)

## 2018-02-20 LAB — BASIC METABOLIC PANEL
Anion gap: 8 (ref 5–15)
BUN: 10 mg/dL (ref 8–23)
CO2: 33 mmol/L — ABNORMAL HIGH (ref 22–32)
CREATININE: 0.75 mg/dL (ref 0.44–1.00)
Calcium: 9.1 mg/dL (ref 8.9–10.3)
Chloride: 99 mmol/L (ref 98–111)
GFR calc Af Amer: >60 mL/min (ref >60)
GFR calc non Af Amer: >60 mL/min (ref >60)
Glucose, Bld: 147 mg/dL — ABNORMAL HIGH (ref 70–99)
Potassium: 3.8 mmol/L (ref 3.5–5.1)
Sodium: 140 mmol/L (ref 135–145)

## 2018-02-20 NOTE — Progress Notes (Signed)
DM: Denies  SA: denies  Pt has COPD, does have SOB at times.  Denies fever, chest pain.    Pt does have cough but is currently on antibiotics.  Pt stated understanding of instructions given for DOS.

## 2018-02-22 NOTE — Anesthesia Preprocedure Evaluation (Addendum)
Anesthesia Evaluation  Patient identified by MRN, date of birth, ID band Patient awake    Reviewed: Allergy & Precautions, NPO status , Patient's Chart, lab work & pertinent test results  History of Anesthesia Complications Negative for: history of anesthetic complications  Airway Mallampati: II  TM Distance: >3 FB Neck ROM: Full    Dental  (+) Teeth Intact   Pulmonary shortness of breath, neg sleep apnea, COPD,  COPD inhaler and oxygen dependent, neg recent URI, Current Smoker,    breath sounds clear to auscultation       Cardiovascular negative cardio ROS   Rhythm:Regular     Neuro/Psych  Headaches,  Neuromuscular disease    GI/Hepatic negative GI ROS, Neg liver ROS,   Endo/Other  negative endocrine ROS  Renal/GU negative Renal ROS     Musculoskeletal  (+) Arthritis ,   Abdominal   Peds  Hematology negative hematology ROS (+)   Anesthesia Other Findings   Reproductive/Obstetrics                            Anesthesia Physical Anesthesia Plan  ASA: III  Anesthesia Plan: General and Regional   Post-op Pain Management:  Regional for Post-op pain   Induction: Intravenous  PONV Risk Score and Plan: 2 and Ondansetron, Dexamethasone and Propofol infusion  Airway Management Planned: LMA  Additional Equipment: None  Intra-op Plan:   Post-operative Plan: Extubation in OR  Informed Consent: I have reviewed the patients History and Physical, chart, labs and discussed the procedure including the risks, benefits and alternatives for the proposed anesthesia with the patient or authorized representative who has indicated his/her understanding and acceptance.   Dental advisory given  Plan Discussed with: CRNA and Surgeon  Anesthesia Plan Comments: (See PAT note 02/20/2018 by Karoline Caldwell, PA-C )       Anesthesia Quick Evaluation

## 2018-02-22 NOTE — Progress Notes (Signed)
Anesthesia Chart Review:  Case:  [id number] Date/Time:  03/05/18 1445   Procedure:  LEFT BREAST LUMPECTOMY WITH RADIOACTIVE SEED AND LEFT AXILLARY SENTINEL LYMPH NODE BIOPSY (Left Breast)   Anesthesia type:  General   Pre-op diagnosis:  LEFT BREAST CANCER   Location:  [place] [facility] ROOM 02 / [place] [facility]   Surgeon:  Rolm Bookbinder, MD      DISCUSSION:  72 yo female current smoker. Pertinent hx includes COPD requiring 3L O2 QHS.  Patient had a preop cardiology eval prior to TKA on 06/27/2017. Nuclear stress test 06/16/17 was low risk.  Postop nursing notes after pt's TKA indicate she had some O2 desaturation to 84-86% on 3L via [place] and briefly required non-rebreather to maintain sats. Sats improved and she was weaned back to RA and discharged on POD 1.  Pt seen by her PCP 02/16/2018 for bronchitis and prescribed Omnicef x 10d.  Anticipate she can proceed as planned barring acute status change and bronchitis has resolved.    VS: BP 127/71   Pulse 95   Temp 36.7 C   Ht 5\' 4"  (1.626 m)   Wt 74 kg   SpO2 95%   BMI 28.01 kg/m   PROVIDERS: Terald Sleeper, PA-C is PCP  Sinda Du, MD is Pulmonologist  LABS: Labs reviewed: Acceptable for surgery. (all labs ordered are listed, but only abnormal results are displayed)  Labs Reviewed  BASIC METABOLIC PANEL - Abnormal; Notable for the following components:      Result Value   CO2 33 (*)    Glucose, Bld 147 (*)    All other components within normal limits  CBC - Abnormal; Notable for the following components:   HCT 46.7 (*)    All other components within normal limits     IMAGES: CHEST - 2 VIEW 05/22/17  COMPARISON:  None.  FINDINGS: The heart size is normal. The lungs are hyperexpanded. The no acute or focal airspace disease is present. There is no edema or effusion. Atherosclerotic calcifications are present at the aortic arch.  Mild degenerative changes are present in the thoracic spine. Vertebral body heights are maintained.  There is mild leftward curvature in the upper thoracic spine and rightward curvature in the lower thoracic spine.  IMPRESSION: 1. No acute abnormality. 2. The lungs are hyperexpanded, suggesting COPD. 3. Aortic atherosclerosis.  EKG: 05/22/2017: Sinus  Rhythm. Rate 75. 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 Low voltage with rightward P-axis and rotation -possible pulmonary disease.   CV: Nuclear stress test 06/16/17:   There was no ST segment deviation noted during stress.  The study is normal. There are no perfusion defects.  This is a low risk study.  The left ventricular ejection fraction is normal (55-65%).  Past Medical History:  Diagnosis Date  . Contact dermatitis    chronic itching but skin biopsy did not show anything specific  . COPD (chronic obstructive pulmonary disease) ([place])   . Emphysema of lung ([place])   . Headache   . OA (osteoarthritis) of knee    right  . Pneumonia   . Requires supplemental oxygen     Past Surgical History:  Procedure Laterality Date  . BREAST SURGERY     left breast aspiration  . CESAREAN SECTION    . COLONOSCOPY    . EYE SURGERY    . KNEE SURGERY Right   . TONSILLECTOMY    . TOTAL KNEE ARTHROPLASTY Right 06/27/2017   Procedure: RIGHT TOTAL KNEE  ARTHROPLASTY;  Surgeon: Garald Balding, MD;  Location: [place] [facility];  Service: Orthopedics;  Laterality: Right;    MEDICATIONS: . Calcium Carb-Cholecalciferol (CALCIUM 600+D3 PO)  . calcium carbonate (TUMS EX) 750 MG chewable tablet  . cefdinir (OMNICEF) 300 MG capsule  . Fluticasone-Salmeterol (ADVAIR DISKUS) 250-50 MCG/DOSE AEPB  . gabapentin (NEURONTIN) 100 MG capsule  . Ipratropium-Albuterol (COMBIVENT RESPIMAT) 20-100 MCG/ACT AERS respimat  . Melatonin 5 MG TABS  . Multiple Vitamin (MULTIVITAMIN WITH MINERALS) TABS tablet  . naproxen sodium (ALEVE) 220 MG tablet  . OXYGEN  . simvastatin (ZOCOR) 20 MG tablet  . triamcinolone cream (KENALOG) 0.1  %   No current facility-administered medications for this encounter.     Wynonia Musty [facility] Short Stay Center/Anesthesiology Phone [phone] 02/22/2018 9:54 AM

## 2018-02-27 DIAGNOSIS — J449 Chronic obstructive pulmonary disease, unspecified: Secondary | ICD-10-CM | POA: Diagnosis not present

## 2018-03-01 ENCOUNTER — Ambulatory Visit
Admission: RE | Admit: 2018-03-01 | Discharge: 2018-03-01 | Disposition: A | Discharge status: Home / Self Care | Payer: Medicare Other | Source: Ambulatory Visit | Attending: General Surgery | Admitting: General Surgery

## 2018-03-01 DIAGNOSIS — C50912 Malignant neoplasm of unspecified site of left female breast: Secondary | ICD-10-CM | POA: Diagnosis not present

## 2018-03-01 DIAGNOSIS — Z170 Estrogen receptor positive status [ER+]: Principal | ICD-10-CM

## 2018-03-01 DIAGNOSIS — C50412 Malignant neoplasm of upper-outer quadrant of left female breast: Secondary | ICD-10-CM

## 2018-03-05 ENCOUNTER — Encounter (HOSPITAL_COMMUNITY): Payer: Self-pay | Admitting: *Deleted

## 2018-03-05 ENCOUNTER — Ambulatory Visit (HOSPITAL_COMMUNITY)
Admission: RE | Admit: 2018-03-05 | Discharge: 2018-03-05 | Disposition: A | Discharge status: Home / Self Care | Payer: Medicare Other | Source: Ambulatory Visit | Attending: General Surgery | Admitting: General Surgery

## 2018-03-05 ENCOUNTER — Other Ambulatory Visit: Payer: Self-pay

## 2018-03-05 ENCOUNTER — Ambulatory Visit (HOSPITAL_COMMUNITY): Payer: Medicare Other | Admitting: Vascular Surgery

## 2018-03-05 ENCOUNTER — Ambulatory Visit (HOSPITAL_COMMUNITY): Payer: Medicare Other | Admitting: Anesthesiology

## 2018-03-05 ENCOUNTER — Ambulatory Visit
Admission: RE | Admit: 2018-03-05 | Discharge: 2018-03-05 | Disposition: A | Discharge status: Home / Self Care | Payer: Medicare Other | Source: Ambulatory Visit | Attending: General Surgery | Admitting: General Surgery

## 2018-03-05 ENCOUNTER — Encounter (HOSPITAL_COMMUNITY)
Admission: RE | Disposition: A | Discharge status: Home / Self Care | Payer: Self-pay | Source: Ambulatory Visit | Attending: General Surgery

## 2018-03-05 DIAGNOSIS — J449 Chronic obstructive pulmonary disease, unspecified: Secondary | ICD-10-CM | POA: Diagnosis not present

## 2018-03-05 DIAGNOSIS — Z170 Estrogen receptor positive status [ER+]: Principal | ICD-10-CM

## 2018-03-05 DIAGNOSIS — C50412 Malignant neoplasm of upper-outer quadrant of left female breast: Secondary | ICD-10-CM

## 2018-03-05 DIAGNOSIS — F17210 Nicotine dependence, cigarettes, uncomplicated: Secondary | ICD-10-CM | POA: Diagnosis not present

## 2018-03-05 DIAGNOSIS — Z9981 Dependence on supplemental oxygen: Secondary | ICD-10-CM | POA: Insufficient documentation

## 2018-03-05 DIAGNOSIS — N6012 Diffuse cystic mastopathy of left breast: Secondary | ICD-10-CM | POA: Diagnosis not present

## 2018-03-05 DIAGNOSIS — M1990 Unspecified osteoarthritis, unspecified site: Secondary | ICD-10-CM | POA: Insufficient documentation

## 2018-03-05 DIAGNOSIS — G8918 Other acute postprocedural pain: Secondary | ICD-10-CM | POA: Diagnosis not present

## 2018-03-05 DIAGNOSIS — E7800 Pure hypercholesterolemia, unspecified: Secondary | ICD-10-CM | POA: Diagnosis not present

## 2018-03-05 DIAGNOSIS — C50912 Malignant neoplasm of unspecified site of left female breast: Secondary | ICD-10-CM | POA: Diagnosis not present

## 2018-03-05 HISTORY — PX: BREAST LUMPECTOMY WITH RADIOACTIVE SEED AND SENTINEL LYMPH NODE BIOPSY: SHX6550

## 2018-03-05 SURGERY — BREAST LUMPECTOMY WITH RADIOACTIVE SEED AND SENTINEL LYMPH NODE BIOPSY
Anesthesia: Regional | Site: Breast | Laterality: Left

## 2018-03-05 MED ORDER — LIDOCAINE HCL (CARDIAC) PF 100 MG/5ML IV SOSY
PREFILLED_SYRINGE | INTRAVENOUS | Status: DC | PRN
  Administered 2018-03-05: 40 mg via INTRAVENOUS

## 2018-03-05 MED ORDER — GABAPENTIN 100 MG PO CAPS
100.0000 mg | ORAL_CAPSULE | ORAL | Status: AC
  Administered 2018-03-05: 100 mg via ORAL
  Filled 2018-03-05: qty 1

## 2018-03-05 MED ORDER — BUPIVACAINE-EPINEPHRINE (PF) 0.25% -1:200000 IJ SOLN
INTRAMUSCULAR | Status: AC
  Filled 2018-03-05: qty 30

## 2018-03-05 MED ORDER — FENTANYL CITRATE (PF) 100 MCG/2ML IJ SOLN
50.0000 ug | Freq: Once | INTRAMUSCULAR | Status: AC
  Administered 2018-03-05: 50 ug via INTRAVENOUS

## 2018-03-05 MED ORDER — FENTANYL CITRATE (PF) 250 MCG/5ML IJ SOLN
INTRAMUSCULAR | Status: AC
  Filled 2018-03-05: qty 5

## 2018-03-05 MED ORDER — CEFAZOLIN SODIUM-DEXTROSE 2-4 GM/100ML-% IV SOLN
2.0000 g | INTRAVENOUS | Status: AC
  Administered 2018-03-05: 2 g via INTRAVENOUS
  Filled 2018-03-05: qty 100

## 2018-03-05 MED ORDER — TECHNETIUM TC 99M SULFUR COLLOID FILTERED
1.0000 | Freq: Once | INTRAVENOUS | Status: AC | PRN
  Administered 2018-03-05: 1 via INTRADERMAL

## 2018-03-05 MED ORDER — ACETAMINOPHEN 10 MG/ML IV SOLN: 1000.0000 mg | Freq: Once | INTRAVENOUS | Status: DC | PRN

## 2018-03-05 MED ORDER — MIDAZOLAM HCL 2 MG/2ML IJ SOLN
1.0000 mg | Freq: Once | INTRAMUSCULAR | Status: AC
  Administered 2018-03-05: 1 mg via INTRAVENOUS

## 2018-03-05 MED ORDER — ACETAMINOPHEN 500 MG PO TABS
1000.0000 mg | ORAL_TABLET | ORAL | Status: AC
  Administered 2018-03-05: 1000 mg via ORAL
  Filled 2018-03-05: qty 2

## 2018-03-05 MED ORDER — FENTANYL CITRATE (PF) 100 MCG/2ML IJ SOLN: 25.0000 ug | INTRAMUSCULAR | Status: DC | PRN

## 2018-03-05 MED ORDER — MIDAZOLAM HCL 2 MG/2ML IJ SOLN
INTRAMUSCULAR | Status: AC
  Filled 2018-03-05: qty 2

## 2018-03-05 MED ORDER — METHYLENE BLUE 0.5 % INJ SOLN
INTRAVENOUS | Status: AC
  Filled 2018-03-05: qty 10

## 2018-03-05 MED ORDER — FENTANYL CITRATE (PF) 100 MCG/2ML IJ SOLN
INTRAMUSCULAR | Status: DC | PRN
  Administered 2018-03-05 (×2): 25 ug via INTRAVENOUS

## 2018-03-05 MED ORDER — 0.9 % SODIUM CHLORIDE (POUR BTL) OPTIME
TOPICAL | Status: DC | PRN
  Administered 2018-03-05: 1000 mL

## 2018-03-05 MED ORDER — ACETAMINOPHEN 500 MG PO TABS: 1000.0000 mg | ORAL_TABLET | Freq: Once | ORAL | Status: DC | PRN

## 2018-03-05 MED ORDER — OXYCODONE HCL 5 MG PO TABS: 5.0000 mg | ORAL_TABLET | Freq: Once | ORAL | Status: DC | PRN

## 2018-03-05 MED ORDER — BUPIVACAINE-EPINEPHRINE (PF) 0.5% -1:200000 IJ SOLN
INTRAMUSCULAR | Status: DC | PRN
  Administered 2018-03-05: 30 mL via PERINEURAL

## 2018-03-05 MED ORDER — FENTANYL CITRATE (PF) 100 MCG/2ML IJ SOLN
INTRAMUSCULAR | Status: AC
  Filled 2018-03-05: qty 2

## 2018-03-05 MED ORDER — ACETAMINOPHEN 160 MG/5ML PO SOLN: 1000.0000 mg | Freq: Once | ORAL | Status: DC | PRN

## 2018-03-05 MED ORDER — SODIUM CHLORIDE (PF) 0.9 % IJ SOLN
INTRAMUSCULAR | Status: AC
  Filled 2018-03-05: qty 10

## 2018-03-05 MED ORDER — LACTATED RINGERS IV SOLN
INTRAVENOUS | Status: DC
  Administered 2018-03-05: 14:00:00 via INTRAVENOUS

## 2018-03-05 MED ORDER — PROPOFOL 10 MG/ML IV BOLUS
INTRAVENOUS | Status: DC | PRN
  Administered 2018-03-05: 140 mg via INTRAVENOUS
  Administered 2018-03-05: 30 mg via INTRAVENOUS

## 2018-03-05 MED ORDER — DEXAMETHASONE SODIUM PHOSPHATE 10 MG/ML IJ SOLN
INTRAMUSCULAR | Status: DC | PRN
  Administered 2018-03-05: 10 mg via INTRAVENOUS

## 2018-03-05 MED ORDER — BUPIVACAINE-EPINEPHRINE 0.25% -1:200000 IJ SOLN
INTRAMUSCULAR | Status: DC | PRN
  Administered 2018-03-05: 4 mL

## 2018-03-05 MED ORDER — KETOROLAC TROMETHAMINE 15 MG/ML IJ SOLN
15.0000 mg | INTRAMUSCULAR | Status: AC
  Administered 2018-03-05: 15 mg via INTRAVENOUS
  Filled 2018-03-05: qty 1

## 2018-03-05 MED ORDER — PROPOFOL 10 MG/ML IV BOLUS
INTRAVENOUS | Status: AC
  Filled 2018-03-05: qty 20

## 2018-03-05 MED ORDER — OXYCODONE HCL 5 MG PO TABS: 5.0000 mg | ORAL_TABLET | Freq: Four times a day (QID) | ORAL | 0 refills | Status: DC | PRN

## 2018-03-05 MED ORDER — ENSURE PRE-SURGERY PO LIQD: 296.0000 mL | Freq: Once | ORAL | Status: DC

## 2018-03-05 MED ORDER — ONDANSETRON HCL 4 MG/2ML IJ SOLN
INTRAMUSCULAR | Status: DC | PRN
  Administered 2018-03-05: 4 mg via INTRAVENOUS

## 2018-03-05 MED ORDER — OXYCODONE HCL 5 MG/5ML PO SOLN: 5.0000 mg | Freq: Once | ORAL | Status: DC | PRN

## 2018-03-05 MED ORDER — PROPOFOL 500 MG/50ML IV EMUL
INTRAVENOUS | Status: DC | PRN
  Administered 2018-03-05: 125 ug/kg/min via INTRAVENOUS

## 2018-03-05 SURGICAL SUPPLY — 57 items
APPLIER CLIP 9.375 MED OPEN (MISCELLANEOUS)
BINDER BREAST LRG (GAUZE/BANDAGES/DRESSINGS) IMPLANT
BINDER BREAST XLRG (GAUZE/BANDAGES/DRESSINGS) ×2 IMPLANT
BLADE SURG 15 STRL LF DISP TIS (BLADE) ×1 IMPLANT
BLADE SURG 15 STRL SS (BLADE) ×2
CANISTER SUCT 3000ML PPV (MISCELLANEOUS) ×3 IMPLANT
CHLORAPREP W/TINT 26ML (MISCELLANEOUS) ×3 IMPLANT
CLIP APPLIE 9.375 MED OPEN (MISCELLANEOUS) IMPLANT
CLIP VESOCCLUDE MED 6/CT (CLIP) ×3 IMPLANT
CLOSURE STERI-STRIP 1/2X4 (GAUZE/BANDAGES/DRESSINGS) ×1
CLOSURE WOUND 1/2 X4 (GAUZE/BANDAGES/DRESSINGS) ×1
CLSR STERI-STRIP ANTIMIC 1/2X4 (GAUZE/BANDAGES/DRESSINGS) ×1 IMPLANT
COVER PROBE W GEL 5X96 (DRAPES) ×3 IMPLANT
COVER SURGICAL LIGHT HANDLE (MISCELLANEOUS) ×3 IMPLANT
COVER WAND RF STERILE (DRAPES) ×3 IMPLANT
DERMABOND ADVANCED (GAUZE/BANDAGES/DRESSINGS) ×2
DERMABOND ADVANCED .7 DNX12 (GAUZE/BANDAGES/DRESSINGS) ×1 IMPLANT
DEVICE DUBIN SPECIMEN MAMMOGRA (MISCELLANEOUS) ×3 IMPLANT
DRAPE CHEST BREAST 15X10 FENES (DRAPES) ×3 IMPLANT
DRAPE UTILITY XL STRL (DRAPES) ×3 IMPLANT
ELECT COATED BLADE 2.86 ST (ELECTRODE) ×3 IMPLANT
ELECT REM PT RETURN 9FT ADLT (ELECTROSURGICAL) ×3
ELECTRODE REM PT RTRN 9FT ADLT (ELECTROSURGICAL) ×1 IMPLANT
GLOVE BIO SURGEON STRL SZ7 (GLOVE) ×3 IMPLANT
GLOVE BIOGEL PI IND STRL 7.5 (GLOVE) ×1 IMPLANT
GLOVE BIOGEL PI INDICATOR 7.5 (GLOVE) ×2
GOWN STRL REUS W/ TWL LRG LVL3 (GOWN DISPOSABLE) ×2 IMPLANT
GOWN STRL REUS W/TWL LRG LVL3 (GOWN DISPOSABLE) ×4
ILLUMINATOR WAVEGUIDE N/F (MISCELLANEOUS) IMPLANT
KIT BASIN OR (CUSTOM PROCEDURE TRAY) ×3 IMPLANT
KIT MARKER MARGIN INK (KITS) ×3 IMPLANT
MARKER SKIN DUAL TIP RULER LAB (MISCELLANEOUS) ×3 IMPLANT
NDL FILTER BLUNT 18X1 1/2 (NEEDLE) IMPLANT
NDL HYPO 25GX1X1/2 BEV (NEEDLE) ×1 IMPLANT
NDL SAFETY ECLIPSE 18X1.5 (NEEDLE) IMPLANT
NEEDLE FILTER BLUNT 18X 1/2SAF (NEEDLE)
NEEDLE FILTER BLUNT 18X1 1/2 (NEEDLE) IMPLANT
NEEDLE HYPO 18GX1.5 SHARP (NEEDLE)
NEEDLE HYPO 25GX1X1/2 BEV (NEEDLE) ×3 IMPLANT
NS IRRIG 1000ML POUR BTL (IV SOLUTION) ×3 IMPLANT
PACK SURGICAL SETUP 50X90 (CUSTOM PROCEDURE TRAY) ×3 IMPLANT
PENCIL BUTTON HOLSTER BLD 10FT (ELECTRODE) ×3 IMPLANT
SPONGE LAP 18X18 X RAY DECT (DISPOSABLE) ×3 IMPLANT
STRIP CLOSURE SKIN 1/2X4 (GAUZE/BANDAGES/DRESSINGS) ×2 IMPLANT
SUT MNCRL AB 4-0 PS2 18 (SUTURE) ×6 IMPLANT
SUT SILK 2 0 SH (SUTURE) IMPLANT
SUT VIC AB 2-0 SH 27 (SUTURE) ×6
SUT VIC AB 2-0 SH 27XBRD (SUTURE) ×2 IMPLANT
SUT VIC AB 3-0 SH 27 (SUTURE) ×4
SUT VIC AB 3-0 SH 27X BRD (SUTURE) ×2 IMPLANT
SYR BULB 3OZ (MISCELLANEOUS) ×3 IMPLANT
SYR CONTROL 10ML LL (SYRINGE) ×3 IMPLANT
TOWEL OR 17X24 6PK STRL BLUE (TOWEL DISPOSABLE) ×3 IMPLANT
TOWEL OR 17X26 10 PK STRL BLUE (TOWEL DISPOSABLE) ×3 IMPLANT
TUBE CONNECTING 12'X1/4 (SUCTIONS) ×1
TUBE CONNECTING 12X1/4 (SUCTIONS) ×2 IMPLANT
YANKAUER SUCT BULB TIP NO VENT (SUCTIONS) ×3 IMPLANT

## 2018-03-05 NOTE — Op Note (Signed)
Preoperative diagnosis: Clinical stage I left breast cancer Postoperative diagnosis: Same as above Procedure: 1.  Left breast radioactive seed guided lumpectomy 2.  Left deep axillary sentinel lymph node biopsy Surgeon: Dr. Serita Grammes Anesthesia: General with pectoral block Estimated blood loss: Minimal Specimens: 1.  Left breast tissue marked with paint containing the radioactive seed and the clip 2.  Left axillary sentinel lymph nodes with highest count of 643 3. Additional medial and lateral margins marked short superior, long lateral, double deep Complications: None Drains: None Special count was correct at completion Disposition to recovery in stable condition  Indications: This is a 73 year old female who has a mammographically detected clinical  left breast cancer.  We discussed all of her options and elected to proceed with a seed guided lumpectomy and axillary sentinel lymph node biopsy.  She had a radioactive seed placed prior to coming to the operating room.  I had these mammograms available in the operating room.  Procedure: After informed consent was obtained she first underwent a pectoral block.  She was injected with technetium in the standard periareolar fashion.  The seed was present in the breast.  She had SCDs in place.  Cefazolin was administered.  She was then placed under general anesthesia without complication.  She was prepped and draped in the standard sterile surgical fashion.  A surgical timeout was then performed.  I identified the seed in the lateral right breast.  I infiltrated Marcaine and made a a curvilinear incision over the seed as this was close to the skin.  I used the neoprobe to identify the seed and guide me to remove the seed the clip and the tumor.  I attempted to remove a rim of  normal tissue around this as well.  I then remove this and painted it. I removed the extra margins as I thought the seed might be close to this.   Mammogram was taken  confirming removal of the seed and the clip.  Hemostasis was observed.  I placed clips in the cavity.  I closed the breast tissue with 2-0 Vicryl.  The skin was closed with 3-0 Vicryl and 4-0 Monocryl.  Glue and Steri-Strips were eventually placed over the incision.  I identified the sentinel node in the axilla.  I made an incision below the axillary hairline.  I carried this through the axillary fascia.  I then excised what appeared to be several axillary sentinel lymph nodes.  The highest count as listed above.  There was minimal background radioactivity.  Hemostasis was observed.  I then closed the axillary fascia with 2-0 Vicryl the skin was closed with 3-0 Vicryl and 4-0 Monocryl.  Glue and Steri-Strips were applied.  She tolerated this well was extubated and transferred to recovery stable.

## 2018-03-05 NOTE — Discharge Instructions (Signed)
[facility] °Office Phone Number [phone] ° °POST OP INSTRUCTIONS °Take 400 mg of ibuprofen every 8 hours or 650 mg tylenol every 6 hours for next 72 hours then as needed. Use ice several times daily also. °Always review your discharge instruction sheet given to you by the facility where your surgery was performed. ° °IF YOU HAVE DISABILITY OR FAMILY LEAVE FORMS, YOU MUST BRING THEM TO THE OFFICE FOR PROCESSING.  DO NOT GIVE THEM TO YOUR DOCTOR. ° °1. A prescription for pain medication may be given to you upon discharge.  Take your pain medication as prescribed, if needed.  If narcotic pain medicine is not needed, then you may take acetaminophen (Tylenol), naprosyn (Alleve) or ibuprofen (Advil) as needed. °2. Take your usually prescribed medications unless otherwise directed °3. If you need a refill on your pain medication, please contact your pharmacy.  They will contact our office to request authorization.  Prescriptions will not be filled after 5pm or on week-ends. °4. You should eat very light the first 24 hours after surgery, such as soup, crackers, pudding, etc.  Resume your normal diet the day after surgery. °5. Most patients will experience some swelling and bruising in the breast.  Ice packs and a good support bra will help.  Wear the breast binder provided or a sports bra for 72 hours day and night.  After that wear a sports bra during the day until you return to the office. Swelling and bruising can take several days to resolve.  °6. It is common to experience some constipation if taking pain medication after surgery.  Increasing fluid intake and taking a stool softener will usually help or prevent this problem from occurring.  A mild laxative (Milk of Magnesia or Miralax) should be taken according to package directions if there are no bowel movements after 48 hours. °7. Unless discharge instructions indicate otherwise, you may remove your bandages 48 hours after surgery and you may  shower at that time.  You may have steri-strips (small skin tapes) in place directly over the incision.  These strips should be left on the skin for 7-10 days and will come off on their own.  If your surgeon used skin glue on the incision, you may shower in 24 hours.  The glue will flake off over the next 2-3 weeks.  Any sutures or staples will be removed at the office during your follow-up visit. °8. ACTIVITIES:  You may resume regular daily activities (gradually increasing) beginning the next day.  Wearing a good support bra or sports bra minimizes pain and swelling.  You may have sexual intercourse when it is comfortable. °a. You may drive when you no longer are taking prescription pain medication, you can comfortably wear a seatbelt, and you can safely maneuver your car and apply brakes. °b. RETURN TO WORK:  ______________________________________________________________________________________ °9. You should see your doctor in the office for a follow-up appointment approximately two weeks after your surgery.  Your doctor’s nurse will typically make your follow-up appointment when she calls you with your pathology report.  Expect your pathology report 3-4 business days after your surgery.  You may call to check if you do not hear from us after three days. °10. OTHER INSTRUCTIONS: _______________________________________________________________________________________________ _____________________________________________________________________________________________________________________________________ °_____________________________________________________________________________________________________________________________________ °_____________________________________________________________________________________________________________________________________ ° °WHEN TO CALL DR Rollyn Scialdone: °1. Fever over 101.0 °2. Nausea and/or vomiting. °3. Extreme swelling or bruising. °4. Continued bleeding from  incision. °5. Increased pain, redness, or drainage from the incision. ° °The clinic staff is available to   answer your questions during regular business hours.  Please don’t hesitate to call and ask to speak to one of the nurses for clinical concerns.  If you have a medical emergency, go to the nearest emergency room or call 911.  A surgeon from [facility] is always on call at the hospital. ° °For further questions, please visit [email or web address] ° °

## 2018-03-05 NOTE — Anesthesia Procedure Notes (Signed)
Procedure Name: LMA Insertion Date/Time: 03/05/2018 3:00 PM Performed by: Raenette Rover, CRNA Pre-anesthesia Checklist: Patient identified, Emergency Drugs available, Suction available and Patient being monitored Patient Re-evaluated:Patient Re-evaluated prior to induction Oxygen Delivery Method: Circle system utilized Preoxygenation: Pre-oxygenation with 100% oxygen Induction Type: IV induction LMA: LMA inserted LMA Size: 4.0 Placement Confirmation: positive ETCO2,  breath sounds checked- equal and bilateral and CO2 detector Tube secured with: Tape Dental Injury: Teeth and Oropharynx as per pre-operative assessment

## 2018-03-05 NOTE — Interval H&P Note (Signed)
History and Physical Interval Note:  03/05/2018 2:20 PM  Joanne Kramer  has presented today for surgery, with the diagnosis of LEFT BREAST CANCER  The various methods of treatment have been discussed with the patient and family. After consideration of risks, benefits and other options for treatment, the patient has consented to  Procedure(s): LEFT BREAST LUMPECTOMY WITH RADIOACTIVE SEED AND LEFT AXILLARY SENTINEL LYMPH NODE BIOPSY (Left) as a surgical intervention .  The patient's history has been reviewed, patient examined, no change in status, stable for surgery.  I have reviewed the patient's chart and labs.  Questions were answered to the patient's satisfaction.     Rolm Bookbinder

## 2018-03-05 NOTE — Transfer of Care (Signed)
Immediate Anesthesia Transfer of Care Note  Patient: Joanne Kramer  Procedure(s) Performed: LEFT BREAST LUMPECTOMY WITH RADIOACTIVE SEED AND LEFT AXILLARY SENTINEL LYMPH NODE BIOPSY (Left Breast)  Patient Location: PACU  Anesthesia Type:GA combined with regional for post-op pain  Level of Consciousness: awake, alert , oriented and patient cooperative  Airway & Oxygen Therapy: Patient Spontanous Breathing and Patient connected to nasal cannula oxygen  Post-op Assessment: Report given to RN and Post -op Vital signs reviewed and stable  Post vital signs: Reviewed and stable  Last Vitals:  Vitals Value Taken Time  BP 119/75 03/05/2018  4:00 PM  Temp    Pulse 72 03/05/2018  4:02 PM  Resp 17 03/05/2018  4:02 PM  SpO2 99 % 03/05/2018  4:02 PM  Vitals shown include unvalidated device data.  Last Pain:  Vitals:   03/05/18 1343  TempSrc:   PainSc: 0-No pain         Complications: No apparent anesthesia complications

## 2018-03-05 NOTE — H&P (Signed)
73 yof referred by Dr Constance Haw for new left breast cancer. she has known mass for years. during this years screening mm she has b density breasts. she was noted to have a 9 mm suspicious involving the uoq of the left breast next to longstanding benign cyst. there is single left ax node with 4 mm cortical thickening. there are stable right breast calcs. 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 the node is negative and concordant. she was seen and is concerned about radiotherapy due to copd. she wanted to consider mammosite as she has read extensively on this.    Past Surgical History Alean Rinne, [place]; 01/29/2018 1:59 PM) Breast Biopsy  Left. Cataract Surgery  Left. Cesarean Section - 1  Knee Surgery  Left. Tonsillectomy   Diagnostic Studies History Alean Rinne, [place]; 01/29/2018 1:59 PM) Colonoscopy  >10 years ago Mammogram  within last year Pap Smear  >5 years ago  Allergies Alean Rinne, RMA; 01/29/2018 2:01 PM) No Known Drug Allergies [01/29/2018]: Allergies Reconciled   Medication History Alean Rinne, [place]; 01/29/2018 2:05 PM) Calcium Carbonate-Vitamin D (600-200MG -UNIT Capsule, Oral) Active. Advair Diskus (250-50MCG/DOSE Aero Pow Br Act, Inhalation) Active. Ipratropium-Albuterol (20-100MCG/ACT [employer], Inhalation four times daily) Active. Melatonin (5MG  Capsule, Oral) Active. Multi-Vitamin (Oral) Active. Zocor (20MG  Tablet, Oral) Active. Triamcinolone Acetonide (0.1% Cream, External) Active. Medications Reconciled  Social History Alean Rinne, [place]; 01/29/2018 1:59 PM) Alcohol use  Occasional alcohol use. Caffeine use  Tea. No drug use  Tobacco use  Current every day smoker.  Family History Alean Rinne, [place]; 01/29/2018 1:59 PM) Family history unknown  First Degree Relatives   Pregnancy / Birth History Alean Rinne, [place]; 01/29/2018 1:59  PM) Age at menarche  32 years. Age of menopause  [age] Contraceptive History  Oral contraceptives. Gravida  1 Length (months) of breastfeeding  3-6 Maternal age  71-35 Para  1  Other Problems Alean Rinne, [place]; 01/29/2018 1:59 PM) Arthritis  Chronic Obstructive Lung Disease  Emphysema Of Lung  Home Oxygen Use  Lump In Breast     Review of Systems Alean Rinne RMA; 01/29/2018 1:59 PM) General Present- Fatigue. Not Present- Appetite Loss, Chills, Fever, Night Sweats, Weight Gain and Weight Loss. Skin Not Present- Change in Wart/Mole, Dryness, Hives, Jaundice, New Lesions, Non-Healing Wounds, Rash and Ulcer. HEENT Not Present- Earache, Hearing Loss, Hoarseness, Nose Bleed, Oral Ulcers, Ringing in the Ears, Seasonal Allergies, Sinus Pain, Sore Throat, Visual Disturbances, Wears glasses/contact lenses and Yellow Eyes. Respiratory Present- Difficulty Breathing. Not Present- Bloody sputum, Chronic Cough, Snoring and Wheezing. Breast Present- Breast Mass. Not Present- Breast Pain, Nipple Discharge and Skin Changes. Cardiovascular Present- Difficulty Breathing Lying Down and Shortness of Breath. Not Present- Chest Pain, Leg Cramps, Palpitations, Rapid Heart Rate and Swelling of Extremities. Gastrointestinal Present- Gets full quickly at meals. Not Present- Abdominal Pain, Bloating, Bloody Stool, Change in Bowel Habits, Chronic diarrhea, Constipation, Difficulty Swallowing, Excessive gas, Hemorrhoids, Indigestion, Nausea, Rectal Pain and Vomiting. Female Genitourinary Not Present- Frequency, Nocturia, Painful Urination, Pelvic Pain and Urgency. Musculoskeletal Present- Back Pain, Joint Pain and Joint Stiffness. Not Present- Muscle Pain, Muscle Weakness and Swelling of Extremities. Neurological Present- Headaches, Numbness and Tingling. Not Present- Decreased Memory, Fainting, Seizures, Tremor, Trouble walking and Weakness. Psychiatric Not Present- Anxiety, Bipolar, Change in Sleep  Pattern, Depression, Fearful and Frequent crying. Endocrine Not Present- Cold Intolerance, Excessive Hunger, Hair Changes, Heat Intolerance, Hot flashes  and New Diabetes. Hematology Present- Easy Bruising. Not Present- Blood Thinners, Excessive bleeding, Gland problems, HIV and Persistent Infections.  Vitals Mardene Celeste King RMA; 01/29/2018 2:00 PM) 01/29/2018 2:00 PM Weight: 171.4 lb Height: 67in Body Surface Area: 1.89 m Body Mass Index: 26.84 kg/m  Temp.: 97.62F  Pulse: 84 (Regular)  BP: 145/80 (Sitting, Left Arm, Standard)       Physical Exam Rolm Bookbinder MD; 01/29/2018 4:23 PM) General Mental Status-Alert.  Head and Neck Trachea-midline. Thyroid Gland Characteristics - normal size and consistency.  Eye Sclera/Conjunctiva - Bilateral-No scleral icterus.  Chest and Lung Exam Chest and lung exam reveals -quiet, even and easy respiratory effort with no use of accessory muscles.  Breast Nipples-No Discharge. Breast Lump-No Palpable Breast Mass.  Cardiovascular Cardiovascular examination reveals -normal heart sounds, regular rate and rhythm with no murmurs.  Neurologic Neurologic evaluation reveals -alert and oriented x 3 with no impairment of recent or remote memory.  Lymphatic Head & Neck  General Head & Neck Lymphatics: Bilateral - Description - Normal. Axillary  General Axillary Region: Bilateral - Description - Normal. 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 We discussed all of the different options for treatment for breast cancer including surgery, chemotherapy, radiation therapy, Herceptin, and antiestrogen therapy. We discussed a sentinel lymph node biopsy as she does not appear to  having lymph node involvement right now. We discussed the performance of that with injection of radioactive tracer. We discussed that there is a chance of having a positive node with a sentinel lymph node biopsy and we will await the permanent pathology to make any other first further decisions in terms of her treatment. We discussed up to a 5% risk lifetime of chronic shoulder pain as well as lymphedema associated with a sentinel lymph node biopsy. We discussed the options for treatment of the breast cancer which included lumpectomy versus a mastectomy. We discussed radioactive seed placement. We discussed a 5-10% chance of a positive margin requiring reexcision in the operating room. We also discussed that she might need radiation therapy if she undergoes lumpectomy. I think with her copd and this tumor at age 43 could forgo radiation. we discussed mammosite extensively and this would be option postop. i think proceeding with surgery and then seeing path before we do antyhing reasonable. I offered appt with rad onc as well. We discussed mastectomy and the postoperative care for that as well. The decision for lumpectomy vs mastectomy has no impact on decision for chemotherapy. Most mastectomy patients will not need radiation therapy. We discussed that there is no difference in her survival whether she undergoes lumpectomy with radiation therapy or antiestrogen therapy versus a mastectomy. There is also no real difference between her recurrence in the breast. We discussed the risks of operation including bleeding, infection, possible reoperation. She understands her further therapy based on surgery path.

## 2018-03-05 NOTE — Anesthesia Postprocedure Evaluation (Signed)
Anesthesia Post Note  Patient: Joanne Kramer  Procedure(s) Performed: LEFT BREAST LUMPECTOMY WITH RADIOACTIVE SEED AND LEFT AXILLARY SENTINEL LYMPH NODE BIOPSY (Left Breast)     Patient location during evaluation: PACU Anesthesia Type: General Level of consciousness: awake and alert Pain management: pain level controlled Vital Signs Assessment: post-procedure vital signs reviewed and stable Respiratory status: spontaneous breathing, nonlabored ventilation, respiratory function stable and patient connected to nasal cannula oxygen Cardiovascular status: blood pressure returned to baseline and stable Postop Assessment: no apparent nausea or vomiting Anesthetic complications: no    Last Vitals:  Vitals:   03/05/18 1600 03/05/18 1615  BP:  112/71  Pulse:  62  Resp:  13  Temp: (!) 36.2 C   SpO2:  99%    Last Pain:  Vitals:   03/05/18 1600  TempSrc:   PainSc: 0-No pain                 Siarah Deleo DAVID

## 2018-03-05 NOTE — Anesthesia Procedure Notes (Signed)
Anesthesia Regional Block: Pectoralis block   Pre-Anesthetic Checklist: ,, timeout performed, Correct Patient, Correct Site, Correct Laterality, Correct Procedure, Correct Position, site marked, Risks and benefits discussed,  Surgical consent,  Pre-op evaluation,  At surgeon's request and post-op pain management  Laterality: Left  Prep: chloraprep       Needles:  Injection technique: Single-shot     Needle Length: 9cm  Needle Gauge: 22     Additional Needles: Arrow StimuQuik ECHO Echogenic Stimulating PNB Needle  Procedures:,,,, ultrasound used (permanent image in chart),,,,  Narrative:  Start time: 03/05/2018 2:28 PM End time: 03/05/2018 2:33 PM Injection made incrementally with aspirations every 5 mL.  Performed by: Personally  Anesthesiologist: Oleta Mouse, MD

## 2018-03-06 ENCOUNTER — Encounter (HOSPITAL_COMMUNITY): Payer: Self-pay | Admitting: General Surgery

## 2018-03-30 DIAGNOSIS — J449 Chronic obstructive pulmonary disease, unspecified: Secondary | ICD-10-CM | POA: Diagnosis not present

## 2018-04-02 ENCOUNTER — Ambulatory Visit: Payer: Medicare Other | Admitting: Rehabilitation

## 2018-04-03 ENCOUNTER — Other Ambulatory Visit: Payer: Self-pay

## 2018-04-03 ENCOUNTER — Inpatient Hospital Stay (HOSPITAL_COMMUNITY): Payer: Medicare Other | Attending: Hematology | Admitting: Hematology

## 2018-04-03 ENCOUNTER — Encounter (HOSPITAL_COMMUNITY): Payer: Self-pay | Admitting: Hematology

## 2018-04-03 ENCOUNTER — Inpatient Hospital Stay (HOSPITAL_COMMUNITY): Payer: Medicare Other

## 2018-04-03 VITALS — BP 127/77 | HR 89 | Temp 98.1°F | Resp 18 | Ht 64.0 in | Wt 171.4 lb

## 2018-04-03 DIAGNOSIS — C50412 Malignant neoplasm of upper-outer quadrant of left female breast: Secondary | ICD-10-CM | POA: Insufficient documentation

## 2018-04-03 DIAGNOSIS — Z79899 Other long term (current) drug therapy: Secondary | ICD-10-CM | POA: Insufficient documentation

## 2018-04-03 DIAGNOSIS — Z170 Estrogen receptor positive status [ER+]: Secondary | ICD-10-CM | POA: Diagnosis not present

## 2018-04-03 DIAGNOSIS — G629 Polyneuropathy, unspecified: Secondary | ICD-10-CM | POA: Insufficient documentation

## 2018-04-03 DIAGNOSIS — F17210 Nicotine dependence, cigarettes, uncomplicated: Secondary | ICD-10-CM | POA: Diagnosis not present

## 2018-04-03 DIAGNOSIS — J449 Chronic obstructive pulmonary disease, unspecified: Secondary | ICD-10-CM | POA: Diagnosis not present

## 2018-04-03 MED ORDER — ANASTROZOLE 1 MG PO TABS: 1.0000 mg | ORAL_TABLET | Freq: Every day | ORAL | 6 refills | Status: DC

## 2018-04-03 NOTE — Assessment & Plan Note (Addendum)
1.  Stage Ia (pT1cpN0) left breast cancer: - Mammogram on 12/26/2017 shows suspicious 9 mm mass in the upper outer quadrant of the left breast.  Solitary borderline left axillary lymph node with cortical thickness of 4 mm. - Biopsy on 01/02/2018 of the left breast 2:00 consistent with invasive ductal carcinoma, grade 1-2.  Left axillary lymph node was negative for carcinoma. - Left breast lumpectomy and sentinel lymph node biopsy on 03/05/2018 shows 1.2 cm IDC, margins negative, grade 1, 0 out of 2 lymph nodes involved.  ER/PR positive, HER-2 negative, Ki-67 2%. -We talked about the pathology report in detail including normal prognosis of stage I breast cancer. - As it is a low-grade tumor, I did not recommend any adjuvant chemotherapy.  She was recommended at least 5 years of adjuvant antiestrogen therapy in the form of anastrozole 1 mg daily.  We talked about side effects of anastrozole including but not limited to vasomotor symptoms, musculoskeletal symptoms, decreased bone mineral density among others.  She understands and gives [place] permission to proceed with the treatment.  We have sent a prescription to her pharmacy. -As per patient request, we will make a referral to Dr.Yanagihara in [facility] for radiation consultation. -Follow-up visits will be once every 4 months during the first 3 years, followed by every 6 months during years 4 and 5.  She will continue to have yearly mammograms. -We will obtain a baseline vitamin D level.  2.  Bone density: - We will obtain a baseline DEXA scan. -She was instructed to take calcium and vitamin D twice daily.  3.  Smoking history: -Patient started smoking at age 41.  At her peak she smoked 2 packs a day for 5 years.  Now she smokes half pack a day. -We will talk to her at next visit about low-dose screening CT scan.

## 2018-04-03 NOTE — Progress Notes (Signed)
[facility] NOTE  Patient Care Team: Theodoro Clock as PCP - General (Physician Assistant) Herminio Commons, MD as PCP - Cardiology (Cardiology)  CHIEF COMPLAINTS/PURPOSE OF CONSULTATION: Newly diagnosed left breast cancer ER+/PR+/HER-  HISTORY OF PRESENTING ILLNESS:  Joanne Kramer 73 y.o. female is here because of recent diagnosis of left breast cancer. Her lump was found on her last mammogram and she had her biopsy on 01/02/18.  She had a lumpectomy by Dr. Donne Hazel done on 03/05/2018.  She is healing well and her pain has improved. She reports she had nerve pain down her left arm after the surgery and it has improved. She started having neuropathy on the bottoms of her feet about 3 months ago and was placed on Gabapentin. The dose has been increased three times and has not help her yet. She also struggles with joint pain mainly her shoulders, hips, and knees. Denies any nausea, vomiting, or diarrhea. Denies any new pains. Had not noticed any recent bleeding such as epistaxis, hematuria or hematochezia. Denies recent chest pain on exertion, shortness of breath on minimal exertion, pre-syncopal episodes, or palpitations. Denies any numbness or tingling in hands or feet. Denies any recent fevers, infections, or recent hospitalizations. Patient reports appetite at 100% and energy level at 100%. She has no problem maintaining her weight at this time.  She previously had a biopsy on her left breast in 1985 which came back benign. She denies any other cancer history.  She has smoked since she was 73 years old. At her peak she smoked 2 packs per day for roughly 5 years. She currently since smokes but has cut back to a half a pack per day.  She lives with her significant other and is full functioning. She performs all her own ADLs and activities. She is drives and handles her own finances. 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 No exposure to any harmful chemicals.   I reviewed her records extensively and collaborated the history with the patient.   In terms of breast cancer risk profile:  She menarched at early age of 27 and went to menopause at age 13 She had 1 pregnancy, her first child was born at age 61  She did received birth control pills for approximately 15 years. She was never exposed to fertility medications or hormone replacement therapy.  She was adopted and her family history is unknown.   MEDICAL HISTORY:  Past Medical History:  Diagnosis Date  . Breast cancer ([place])   . Contact dermatitis    chronic itching but skin biopsy did not show anything specific  . COPD (chronic obstructive pulmonary disease) ([place])   . Emphysema of lung ([place])   . Headache   . OA (osteoarthritis) of knee    right  . Pneumonia   . Requires supplemental oxygen     SURGICAL HISTORY: Past Surgical History:  Procedure Laterality Date  . BREAST LUMPECTOMY WITH RADIOACTIVE SEED AND SENTINEL LYMPH NODE BIOPSY Left 03/05/2018   Procedure: LEFT BREAST LUMPECTOMY WITH RADIOACTIVE SEED AND LEFT AXILLARY SENTINEL LYMPH NODE BIOPSY;  Surgeon: Rolm Bookbinder, MD;  Location: [facility];  Service: General;  Laterality: Left;  . BREAST SURGERY     left breast aspiration  . CESAREAN SECTION    . COLONOSCOPY    . EYE SURGERY    . KNEE SURGERY Right   . TONSILLECTOMY    . TOTAL KNEE ARTHROPLASTY Right 06/27/2017   Procedure: RIGHT TOTAL KNEE  ARTHROPLASTY;  Surgeon: Garald Balding, MD;  Location: [facility];  Service: Orthopedics;  Laterality: Right;    SOCIAL HISTORY: Social History   Socioeconomic History  . Marital status: Soil scientist    Spouse name: Not on file  . Number of children: 2  . Years of education: 18  . Highest education level: Not on file  Occupational History  . Occupation: Retired    Comment: Careers information officer  Social Needs  . Financial resource strain: Not hard at all  . Food insecurity:    Worry: Never true    Inability: Never true  .  Transportation needs:    Medical: No    Non-medical: No  Tobacco Use  . Smoking status: Current Some Day Smoker    Packs/day: 0.50    Years: 55.00    Pack years: 27.50    Types: Cigarettes    Last attempt to quit: 05/07/2017    Years since quitting: 0.9  . Smokeless tobacco: Never Used  Substance and Sexual Activity  . Alcohol use: No  . Drug use: No  . Sexual activity: Not Currently  Lifestyle  . Physical activity:    Days per week: 5 days    Minutes per session: 60 min  . Stress: Not at all  Relationships  . Social connections:    Talks on phone: More than three times a week    Gets together: More than three times a week    Attends religious service: Never    Active member of club or organization: No    Attends meetings of clubs or organizations: Never    Relationship status: Living with partner  . Intimate partner violence:    Fear of current or ex partner: No    Emotionally abused: No    Physically abused: No    Forced sexual activity: No  Other Topics Concern  . Not on file  Social History Narrative  . Not on file    FAMILY HISTORY: Family History  Adopted: Yes    ALLERGIES:  has No Known Allergies.  MEDICATIONS:  Current Outpatient Medications  Medication Sig Dispense Refill  . Calcium Carb-Cholecalciferol (CALCIUM 600+D3 PO) Take 1 tablet by mouth daily.    . calcium carbonate (TUMS EX) 750 MG chewable tablet Chew 1-2 tablets by mouth 3 (three) times daily as needed (for heartburn/indigestion.).     Marland Kitchen Fluticasone-Salmeterol (ADVAIR DISKUS) 250-50 MCG/DOSE AEPB Inhale 1 puff into the lungs 2 (two) times daily.    Marland Kitchen gabapentin (NEURONTIN) 100 MG capsule Take 1-3 capsules (100-300 mg total) by mouth 3 (three) times daily. (Patient taking differently: Take 100 mg by mouth at bedtime. ) 90 capsule 5  . Ipratropium-Albuterol (COMBIVENT RESPIMAT) 20-100 MCG/ACT AERS respimat Inhale 1 puff into the lungs every 4 (four) hours as needed ((typically 4x's daily)). 4 g  11  . Melatonin 5 MG TABS Take 5 mg by mouth at bedtime as needed (for sleep.).     Marland Kitchen Multiple Vitamin (MULTIVITAMIN WITH MINERALS) TABS tablet Take 1 tablet by mouth daily.    . naproxen sodium (ALEVE) 220 MG tablet Take 440 mg by mouth daily as needed (pain).    . OXYGEN Inhale 2 L into the lungs at bedtime.    . simvastatin (ZOCOR) 20 MG tablet Take 1 tablet (20 mg total) by mouth at bedtime. 90 tablet 1  . triamcinolone cream (KENALOG) 0.1 % Apply 1 application topically 2 (two) times daily as needed (for skin irritation/contact dermatitis). 453.6 g  1  . anastrozole (ARIMIDEX) 1 MG tablet Take 1 tablet (1 mg total) by mouth daily. 30 tablet 6   No current facility-administered medications for this visit.     REVIEW OF SYSTEMS:   Constitutional: Denies fevers, chills or abnormal night sweats Eyes: Denies blurriness of vision, double vision or watery eyes Ears, nose, mouth, throat, and face: Denies mucositis or sore throat Respiratory: Denies cough, dyspnea or wheezes Cardiovascular: Denies palpitation, chest discomfort or lower extremity swelling Gastrointestinal:  Denies nausea, heartburn or change in bowel habits Skin: Denies abnormal skin rashes Lymphatics: Denies new lymphadenopathy or easy bruising Neurological:Denies numbness, tingling or new weaknesses Behavioral/Psych: Mood is stable, no new changes  Breast: Left breast biopsy, incision intake, no redness or swelling, no drainage All other systems were reviewed with the patient and are negative.  PHYSICAL EXAMINATION: ECOG PERFORMANCE STATUS: 1 - Symptomatic but completely ambulatory  Vitals:   04/03/18 1300  BP: 127/77  Pulse: 89  Resp: 18  Temp: 98.1 F (36.7 C)  SpO2: 93%   Filed Weights   04/03/18 1300  Weight: 171 lb 7 oz (77.8 kg)    GENERAL:alert, no distress and comfortable SKIN: skin color, texture, turgor are normal, no rashes or significant lesions EYES: normal, conjunctiva are pink and non-injected,  sclera clear OROPHARYNX:no exudate, no erythema and lips, buccal mucosa, and tongue normal  NECK: supple, thyroid normal size, non-tender, without nodularity LYMPH:  no palpable lymphadenopathy in the cervical, axillary or inguinal LUNGS: clear to auscultation and percussion with normal breathing effort HEART: regular rate & rhythm and no murmurs and no lower extremity edema ABDOMEN:abdomen soft, non-tender and normal bowel sounds Musculoskeletal:no cyanosis of digits and no clubbing  PSYCH: alert & oriented x 3 with fluent speech NEURO: no focal motor/sensory deficits BREAST:No palpable nodules in breast. No palpable axillary or supraclavicular lymphadenopathy Left breast lumpectomy scar is well-healed.  LABORATORY DATA:  I have reviewed the data as listed Lab Results  Component Value Date   WBC 7.1 02/20/2018   HGB 14.1 02/20/2018   HCT 46.7 (H) 02/20/2018   MCV 99.2 02/20/2018   PLT 206 02/20/2018   Lab Results  Component Value Date   NA 140 02/20/2018   K 3.8 02/20/2018   CL 99 02/20/2018   CO2 33 (H) 02/20/2018    RADIOGRAPHIC STUDIES: I have personally reviewed the radiological reports and agreed with the findings in the report.  I have reviewed Francene Finders, NP's note and agree with the documentation.  I personally performed a face-to-face visit, made revisions and my assessment and plan is as follows.   ASSESSMENT AND PLAN:  Malignant neoplasm of upper-outer quadrant of left breast in female, estrogen receptor positive ([place]) 1.  Stage Ia (pT1cpN0) left breast cancer: - Mammogram on 12/26/2017 shows suspicious 9 mm mass in the upper outer quadrant of the left breast.  Solitary borderline left axillary lymph node with cortical thickness of 4 mm. - Biopsy on 01/02/2018 of the left breast 2:00 consistent with invasive ductal carcinoma, grade 1-2.  Left axillary lymph node was negative for carcinoma. - Left breast lumpectomy and sentinel lymph node biopsy on 03/05/2018  shows 1.2 cm IDC, margins negative, grade 1, 0 out of 2 lymph nodes involved.  ER/PR positive, HER-2 negative, Ki-67 2%. -We talked about the pathology report in detail including normal prognosis of stage I breast cancer. - As it is a low-grade tumor, I did not recommend any adjuvant chemotherapy.  She was recommended at least 5  years of adjuvant antiestrogen therapy in the form of anastrozole 1 mg daily.  We talked about side effects of anastrozole including but not limited to vasomotor symptoms, musculoskeletal symptoms, decreased bone mineral density among others.  She understands and gives [place] permission to proceed with the treatment.  We have sent a prescription to her pharmacy. -As per patient request, we will make a referral to Dr[facility]Yanagihara in [facility] for radiation consultation. -Follow-up visits will be once every 4 months during the first 3 years, followed by every 6 months during years 4 and 5.  She will continue to have yearly mammograms. -We will obtain a baseline vitamin D level.  2.  Bone density: - We will obtain a baseline DEXA scan. -She was instructed to take calcium and vitamin D twice daily.  3.  Smoking history: -Patient started smoking at age 52.  At her peak she smoked 2 packs a day for 5 years.  Now she smokes half pack a day. -We will talk to her at next visit about low-dose screening CT scan.   All questions were answered. The patient knows to call the clinic with any problems, questions or concerns.    Derek Jack, MD 04/03/18

## 2018-04-03 NOTE — Patient Instructions (Signed)
[facility] at [facility] Discharge Instructions     Thank you for choosing [facility] at [facility] to provide your oncology and hematology care.  To afford each patient quality time with our provider, please arrive at least 15 minutes before your scheduled appointment time.   If you have a lab appointment with the Cancer Center please come in thru the  Main Entrance and check in at the main information desk  You need to re-schedule your appointment should you arrive 10 or more minutes late.  We strive to give you quality time with our providers, and arriving late affects you and other patients whose appointments are after yours.  Also, if you no show three or more times for appointments you may be dismissed from the clinic at the providers discretion.     Again, thank you for choosing [facility].  Our hope is that these requests will decrease the amount of time that you wait before being seen by our physicians.       _____________________________________________________________  Should you have questions after your visit to [facility], please contact our office at [phone] between the hours of 8:00 a.m. and 4:30 p.m.  Voicemails left after 4:00 p.m. will not be returned until the following business day.  For prescription refill requests, have your pharmacy contact our office and allow 72 hours.    Cancer Center Support Programs:   > Cancer Support Group  2nd Tuesday of the month 1pm-2pm, Journey Room    

## 2018-04-04 ENCOUNTER — Encounter (HOSPITAL_COMMUNITY): Payer: Self-pay | Admitting: Lab

## 2018-04-04 ENCOUNTER — Ambulatory Visit: Payer: Medicare Other | Admitting: Rehabilitation

## 2018-04-04 LAB — VITAMIN D 25 HYDROXY (VIT D DEFICIENCY, FRACTURES): Vit D, 25-Hydroxy: 19.4 ng/mL — ABNORMAL LOW (ref 30.0–100.0)

## 2018-04-04 NOTE — Progress Notes (Unsigned)
Referral sent to [facility]. Records faxed on 2/5

## 2018-04-06 ENCOUNTER — Ambulatory Visit: Payer: Medicare Other | Attending: General Surgery | Admitting: Physical Therapy

## 2018-04-06 DIAGNOSIS — R293 Abnormal posture: Secondary | ICD-10-CM | POA: Insufficient documentation

## 2018-04-06 DIAGNOSIS — Z483 Aftercare following surgery for neoplasm: Secondary | ICD-10-CM | POA: Insufficient documentation

## 2018-04-06 DIAGNOSIS — Z9889 Other specified postprocedural states: Secondary | ICD-10-CM | POA: Insufficient documentation

## 2018-04-11 ENCOUNTER — Ambulatory Visit (INDEPENDENT_AMBULATORY_CARE_PROVIDER_SITE_OTHER): Payer: Medicare Other | Admitting: Orthopaedic Surgery

## 2018-04-11 ENCOUNTER — Encounter (INDEPENDENT_AMBULATORY_CARE_PROVIDER_SITE_OTHER): Payer: Self-pay | Admitting: Orthopaedic Surgery

## 2018-04-11 ENCOUNTER — Encounter: Payer: Self-pay | Admitting: Rehabilitation

## 2018-04-11 ENCOUNTER — Other Ambulatory Visit: Payer: Self-pay

## 2018-04-11 ENCOUNTER — Ambulatory Visit: Payer: Medicare Other | Admitting: Rehabilitation

## 2018-04-11 VITALS — BP 117/73 | HR 81 | Ht 64.0 in | Wt 171.0 lb

## 2018-04-11 DIAGNOSIS — Z483 Aftercare following surgery for neoplasm: Secondary | ICD-10-CM | POA: Diagnosis not present

## 2018-04-11 DIAGNOSIS — Z9889 Other specified postprocedural states: Secondary | ICD-10-CM

## 2018-04-11 DIAGNOSIS — R293 Abnormal posture: Secondary | ICD-10-CM | POA: Diagnosis not present

## 2018-04-11 DIAGNOSIS — Z96651 Presence of right artificial knee joint: Secondary | ICD-10-CM | POA: Diagnosis not present

## 2018-04-11 NOTE — Progress Notes (Signed)
Office Visit Note   Patient: Joanne Kramer           Date of Birth: Feb 16, 1946           MRN: [id number] Visit Date: 04/11/2018              Requested by: Terald Sleeper, PA-C [address] [place], [place] [address] PCP: Terald Sleeper, PA-C   Assessment & Plan: Visit Diagnoses:  1. History of total right knee replacement     Plan: Approximately 46-month status post right total knee replacement and doing relatively well.  Just recently diagnosed with early stage breast cancer.  Has had a lumpectomy and is being evaluated for radiation.  No chemotherapy necessary.  Occasionally has some trouble with her knee but particularly going down inclines or stairs.  Has not been exercising.  Also has been experiencing some decreased sensibility along the lateral aspect of her leg but not around her incision.  Has prior history of neuropathy and decreased sensibility in the plantar aspect of her foot.  Taking gabapentin on a chronic basis.  Have suggested that she check with her family physician regarding an MRI of her lumbar spine .we will plan to see back in 6 months or as needed  Follow-Up Instructions: Return if symptoms worsen or fail to improve.   Orders:  No orders of the defined types were placed in this encounter.  No orders of the defined types were placed in this encounter.     Procedures: No procedures performed   Clinical Data: No additional findings.   Subjective: Chief Complaint  Patient presents with  . Right Knee - Follow-up    Right TKA 06/27/17  Patient presents today for a 60month follow up. She had right total knee arthroplasty 06/27/17. She said that she is doing well. She still has numbness on the lateral side. She also states that she has soreness interior and superior to her knee. Has been diagnosed with breast cancer-early stage.  Has had a lumpectomy and is being evaluated for radiation.  No chemotherapy necessary.  HPI  Review of  Systems   Objective: Vital Signs: BP 117/73   Pulse 81   Ht 5\' 4"  (1.626 m)   Wt 171 lb (77.6 kg)   BMI 29.35 kg/m   Physical Exam Constitutional:      Appearance: She is well-developed.  Eyes:     Pupils: Pupils are equal, round, and reactive to light.  Pulmonary:     Effort: Pulmonary effort is normal.  Skin:    General: Skin is warm and dry.  Neurological:     Mental Status: She is alert and oriented to person, place, and time.  Psychiatric:        Behavior: Behavior normal.     Ortho Exam awake alert and oriented x3.  Comfortable sitting.  No use of ambulatory aid.  No limping.  Certainly has significant thigh atrophy weakness.  Able to fully extend her right knee without effusion there is no instability and she flexed almost 120 degrees.  Does have some altered sensibility in the plantar aspect of her feet and also the lateral aspect of the right leg.  Motor exam intact no significant areas of tenderness about her knee.  Will occasionally has some discomfort in the quads at the end of the day or when she is going down inclines  Specialty Comments:  No specialty comments available.  Imaging: No results found.   PMFS History: Patient Active  Problem List   Diagnosis Date Noted  . Neuropathy 02/18/2018  . Malignant neoplasm of upper-outer quadrant of left breast in female, estrogen receptor positive ([place]) 01/16/2018  . 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 Total knee replacement status, right 06/27/2017  . Primary osteoarthritis of right knee 02/15/2017  . Primary osteoarthritis of left knee 02/15/2017  . 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 Cephalalgia 02/15/2017  . Chronic obstructive pulmonary disease ([place]) 11/08/2016  . Pure hypercholesterolemia 11/08/2016  . Hypoxia 11/08/2016  . Tobacco abuse 11/08/2016   Past Medical History:  Diagnosis Date  . Breast cancer ([place])   . Contact dermatitis    chronic itching but skin biopsy  did not show anything specific  . COPD (chronic obstructive pulmonary disease) ([place])   . Emphysema of lung ([place])   . Headache   . OA (osteoarthritis) of knee    right  . Pneumonia   . Requires supplemental oxygen     Family History  Adopted: Yes    Past Surgical History:  Procedure Laterality Date  . BREAST LUMPECTOMY WITH RADIOACTIVE SEED AND SENTINEL LYMPH NODE BIOPSY Left 03/05/2018   Procedure: LEFT BREAST LUMPECTOMY WITH RADIOACTIVE SEED AND LEFT AXILLARY SENTINEL LYMPH NODE BIOPSY;  Surgeon: Rolm Bookbinder, MD;  Location: [facility];  Service: General;  Laterality: Left;  . BREAST SURGERY     left breast aspiration  . CESAREAN SECTION    . COLONOSCOPY    . EYE SURGERY    . KNEE SURGERY Right   . TONSILLECTOMY    . TOTAL KNEE ARTHROPLASTY Right 06/27/2017   Procedure: RIGHT TOTAL KNEE ARTHROPLASTY;  Surgeon: Garald Balding, MD;  Location: [facility];  Service: Orthopedics;  Laterality: Right;   Social History   Occupational History  . Occupation: Retired    Comment: Careers information officer  Tobacco Use  . Smoking status: Current Some Day Smoker    Packs/day: 0.50    Years: 55.00    Pack years: 27.50    Types: Cigarettes    Last attempt to quit: 05/07/2017    Years since quitting: 0.9  . Smokeless tobacco: Never Used  Substance and Sexual Activity  . Alcohol use: No  . Drug use: No  . Sexual activity: Not Currently

## 2018-04-11 NOTE — Therapy (Signed)
[place], [place], [address] Phone: [phone]   Fax:  [phone]  Physical Therapy Evaluation  Patient Details  Name: Joanne Kramer MRN: [id number] Date of Birth: 22-Mar-1945 Referring Provider (PT): Donne Hazel   Encounter Date: 04/11/2018  PT End of Session - 04/11/18 1004    Visit Number  1    Number of Visits  1    PT Start Time  0930    PT Stop Time  1275    PT Time Calculation (min)  32 min    Activity Tolerance  Patient tolerated treatment well    Behavior During Therapy  [facility] for tasks assessed/performed       Past Medical History:  Diagnosis Date  . Breast cancer ([place])   . Contact dermatitis    chronic itching but skin biopsy did not show anything specific  . COPD (chronic obstructive pulmonary disease) ([place])   . Emphysema of lung ([place])   . Headache   . OA (osteoarthritis) of knee    right  . Pneumonia   . Requires supplemental oxygen     Past Surgical History:  Procedure Laterality Date  . BREAST LUMPECTOMY WITH RADIOACTIVE SEED AND SENTINEL LYMPH NODE BIOPSY Left 03/05/2018   Procedure: LEFT BREAST LUMPECTOMY WITH RADIOACTIVE SEED AND LEFT AXILLARY SENTINEL LYMPH NODE BIOPSY;  Surgeon: Rolm Bookbinder, MD;  Location: [facility];  Service: General;  Laterality: Left;  . BREAST SURGERY     left breast aspiration  . CESAREAN SECTION    . COLONOSCOPY    . EYE SURGERY    . KNEE SURGERY Right   . TONSILLECTOMY    . TOTAL KNEE ARTHROPLASTY Right 06/27/2017   Procedure: RIGHT TOTAL KNEE ARTHROPLASTY;  Surgeon: Garald Balding, MD;  Location: [facility];  Service: Orthopedics;  Laterality: Right;    There were no vitals filed for this visit.   Subjective Assessment - 04/11/18 0936    Subjective  No limitations reported.  Some discomfort more than pain     Pertinent History  Left lumpectomy with SLNB 03/05/18 by Dr. Donne Hazel due to Stage IA IDC. 0/2 nodes positive ER/PR positive HER2  negative. No Chemotherapy needed and referral to rad one in [place] placed. Starting anastrozole. History of Rt TKR 2019, COPD, Emphysema, oxygen at night     Limitations  --   reports none   Currently in Pain?  No/denies    Pain Score  --   tenderness only        [facility] PT Assessment - 04/11/18 0001      Assessment   Medical Diagnosis  Lt breast cancer    Referring Provider (PT)  Donne Hazel    Onset Date/Surgical Date  03/05/18    Hand Dominance  Right      Precautions   Precaution Comments  cancer, lymphedema      Restrictions   Weight Bearing Restrictions  No      Balance Screen   Has the patient fallen in the past 6 months  No    Has the patient had a decrease in activity level because of a fear of falling?   No    Is the patient reluctant to leave their home because of a fear of falling?   No      Home Environment   Living Environment  Private residence    Living Arrangements  Spouse/significant other    Type of [place]  One level      Prior Function   Level of Independence  Independent    Vocation  Unemployed    Leisure  chair yoga but not since surgery going [facility] [employer]       Cognition   Overall Cognitive Status  Within Functional Limits for tasks assessed      Sensation   Additional Comments  having some nerve issues after an issue in surgery. on gabapentin      Coordination   Gross Motor Movements are Fluid and Coordinated  Yes      Posture/Postural Control   Posture/Postural Control  Postural limitations    Postural Limitations  Rounded Shoulders;Forward head;Increased thoracic kyphosis      ROM / Strength   AROM / PROM / Strength  AROM;PROM;Strength      AROM   Overall AROM Comments  no pulling felt Lt UE    AROM Assessment Site  Shoulder    Right/Left Shoulder  Right;Left    Right Shoulder Flexion  170 Degrees    Right Shoulder ABduction  170 Degrees    Right Shoulder Internal Rotation  80 Degrees    Right Shoulder External  Rotation  85 Degrees    Right Shoulder Horizontal ABduction  40 Degrees    Left Shoulder Flexion  170 Degrees    Left Shoulder ABduction  170 Degrees    Left Shoulder Internal Rotation  80 Degrees    Left Shoulder External Rotation  85 Degrees    Left Shoulder Horizontal ABduction  40 Degrees      Strength   Overall Strength Comments  4/5 bil all motions        LYMPHEDEMA/ONCOLOGY QUESTIONNAIRE - 04/11/18 0944      Type   Cancer Type  breast cancer      Surgeries   Lumpectomy Date  03/05/18    Sentinel Lymph Node Biopsy Date  03/05/18    Number Lymph Nodes Removed  2      Treatment   Active Chemotherapy Treatment  No    Past Chemotherapy Treatment  No    Active Radiation Treatment  No    Past Radiation Treatment  No    Current Hormone Treatment  Yes    Drug Name  anastrozole      What other symptoms do you have   Are you Having Heaviness or Tightness  No    Are you having Pain  No      Lymphedema Assessments   Lymphedema Assessments  Upper extremities      Right Upper Extremity Lymphedema   15 cm Proximal to Olecranon Process  30.3 cm    Olecranon Process  20.3 cm    10 cm Proximal to Ulnar Styloid Process  20 cm    Just Proximal to Ulnar Styloid Process  15.3 cm    Across Hand at [employer]  19.5 cm    At [place] of 2nd Digit  6.3 cm      Left Upper Extremity Lymphedema   10 cm Proximal to Olecranon Process  30.3 cm    Olecranon Process  20.8 cm    10 cm Proximal to Ulnar Styloid Process  20.5 cm    Just Proximal to Ulnar Styloid Process  15.8 cm    Across Hand at [employer]  20 cm    At [place] of 2nd Digit  6.2 cm             Objective measurements  completed on examination: See above findings.              PT Education - 04/11/18 1004    Education Details  walking, lymphedema, POC if radiation needed, stretches if radiation needed    Person(s) Educated  Patient    Methods  Explanation;Demonstration;Tactile cues;Verbal cues;Handout     Comprehension  Verbalized understanding;Returned demonstration          PT Long Term Goals - 04/11/18 1007      PT LONG TERM GOAL #1   Title  Pt will be knowledgeable about lmyphedema and what to look for for clinic return    Time  1    Period  Days    Status  Achieved      PT LONG TERM GOAL #2   Title  Pt will be knowledgeable about what stretches to do through radiation     Time  1    Period  Days    Status  Achieved             Plan - 04/11/18 1005    Clinical Impression Statement  Pt presents s/p Lt lumpectomy with 2 lymph nodes removed both negative on 03/05/18 with full ROM, no reports of pain, and equal MMT between sides.  Pt unsure if she will be having radiation.  Educated on stretches to perform if radiation occurs and when to return post radiation.  Education on walking and return to the [facility] in [place].  Lymphedema education and handout with low risk of around 5% without radiation    Clinical Presentation  Evolving    Clinical Decision Making  Low    Rehab Potential  Excellent    PT Frequency  One time visit    PT Treatment/Interventions  Patient/family education    PT Next Visit Plan  if returns reassess    Consulted and Agree with Plan of Care  Patient       Patient will benefit from skilled therapeutic intervention in order to improve the following deficits and impairments:  Decreased knowledge of precautions  Visit Diagnosis: Aftercare following surgery for neoplasm  Status post left breast lumpectomy  Abnormal posture     Problem List Patient Active Problem List   Diagnosis Date Noted  . Neuropathy 02/18/2018  . Malignant neoplasm of upper-outer quadrant of left breast in female, estrogen receptor positive ([place]) 01/16/2018  . 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 Total knee replacement status, right 06/27/2017  . Primary osteoarthritis of right knee 02/15/2017  . Primary osteoarthritis of left knee 02/15/2017  .  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 Cephalalgia 02/15/2017  . Chronic obstructive pulmonary disease ([place]) 11/08/2016  . Pure hypercholesterolemia 11/08/2016  . Hypoxia 11/08/2016  . Tobacco abuse 11/08/2016    Shan Levans, PT 04/11/2018, 10:09 AM  [place], [place], [address] Phone: [phone]   Fax:  [phone]  Name: Tyleigh Mahn MRN: [id number] Date of Birth: 1946-02-17

## 2018-04-11 NOTE — Patient Instructions (Signed)
Post op stretches if pt has radiation Education on walking program at home or [employer] Lymphedema and what to look for

## 2018-04-12 DIAGNOSIS — Z170 Estrogen receptor positive status [ER+]: Secondary | ICD-10-CM | POA: Diagnosis not present

## 2018-04-12 DIAGNOSIS — Z720 Tobacco use: Secondary | ICD-10-CM | POA: Diagnosis not present

## 2018-04-12 DIAGNOSIS — C50412 Malignant neoplasm of upper-outer quadrant of left female breast: Secondary | ICD-10-CM | POA: Diagnosis not present

## 2018-04-12 DIAGNOSIS — J449 Chronic obstructive pulmonary disease, unspecified: Secondary | ICD-10-CM | POA: Diagnosis not present

## 2018-04-13 ENCOUNTER — Other Ambulatory Visit (HOSPITAL_COMMUNITY): Payer: Self-pay | Admitting: Nurse Practitioner

## 2018-04-13 ENCOUNTER — Telehealth (HOSPITAL_COMMUNITY): Payer: Self-pay | Admitting: *Deleted

## 2018-04-13 DIAGNOSIS — E559 Vitamin D deficiency, unspecified: Secondary | ICD-10-CM

## 2018-04-13 MED ORDER — ERGOCALCIFEROL 1.25 MG (50000 UT) PO CAPS: 50000.0000 [IU] | ORAL_CAPSULE | ORAL | 1 refills | Status: DC

## 2018-04-13 NOTE — Telephone Encounter (Signed)
-----   Message from [place], NP-C sent at 04/13/2018 12:54 PM EST ----- Will you call her and tell her I called her in some higher strength Vit D she takes weekly for 2 months then she needs to come back in for a lab draw to re checek it and we could probably put her on daily vit d.  Joanne Kramer will be calling her with her lab appointment time

## 2018-04-13 NOTE — Telephone Encounter (Signed)
Pt aware that new Rx for Vit D will be sent to her pharmacy and she will take it weekly. Pt verbalized understanding and has appointment for repeat labs already.

## 2018-04-17 ENCOUNTER — Ambulatory Visit: Payer: Medicare Other

## 2018-04-17 ENCOUNTER — Ambulatory Visit: Payer: Medicare Other | Admitting: Radiation Oncology

## 2018-04-24 ENCOUNTER — Other Ambulatory Visit: Payer: Self-pay | Admitting: Physician Assistant

## 2018-04-24 DIAGNOSIS — J449 Chronic obstructive pulmonary disease, unspecified: Secondary | ICD-10-CM

## 2018-04-25 ENCOUNTER — Telehealth (HOSPITAL_COMMUNITY): Payer: Self-pay | Admitting: *Deleted

## 2018-04-25 NOTE — Telephone Encounter (Signed)
Message sent to provider 

## 2018-04-28 DIAGNOSIS — J449 Chronic obstructive pulmonary disease, unspecified: Secondary | ICD-10-CM | POA: Diagnosis not present

## 2018-05-01 ENCOUNTER — Encounter (HOSPITAL_COMMUNITY): Payer: Self-pay | Admitting: *Deleted

## 2018-05-02 ENCOUNTER — Encounter (HOSPITAL_COMMUNITY): Payer: Self-pay | Admitting: *Deleted

## 2018-05-02 NOTE — Progress Notes (Signed)
I spoke with patient today about her stopping the anastrozole and monitoring her symptoms for 2-3 weeks. I advised the patient to call me and let me know how she is doing and at that time, Dr. Delton Coombes will determine what she needs to do.  She verbalizes understanding.

## 2018-05-16 ENCOUNTER — Other Ambulatory Visit (HOSPITAL_COMMUNITY): Payer: Medicare Other

## 2018-05-18 ENCOUNTER — Ambulatory Visit: Payer: Medicare Other | Admitting: Physician Assistant

## 2018-05-22 ENCOUNTER — Other Ambulatory Visit: Payer: Self-pay | Admitting: Physician Assistant

## 2018-05-22 ENCOUNTER — Telehealth: Payer: Self-pay | Admitting: Physician Assistant

## 2018-05-22 ENCOUNTER — Other Ambulatory Visit (HOSPITAL_COMMUNITY): Payer: Self-pay | Admitting: Hematology

## 2018-05-22 DIAGNOSIS — C50412 Malignant neoplasm of upper-outer quadrant of left female breast: Secondary | ICD-10-CM

## 2018-05-22 DIAGNOSIS — Z170 Estrogen receptor positive status [ER+]: Principal | ICD-10-CM

## 2018-05-22 MED ORDER — LETROZOLE 2.5 MG PO TABS: 2.5000 mg | ORAL_TABLET | Freq: Every day | ORAL | 3 refills | Status: DC

## 2018-05-22 MED ORDER — PREDNISONE 10 MG (21) PO TBPK: ORAL_TABLET | ORAL | 0 refills | Status: DC

## 2018-05-22 NOTE — Telephone Encounter (Signed)
Going to send a pack of prednisone in for her, does she feel like she is having a COPD exacerbation?

## 2018-05-22 NOTE — Telephone Encounter (Signed)
Oncology says no relation to their medication?  Without seeing her or doing labs I can suggest a fluid pill, can she take that? I would send lasix if she can do it and then have a recheck, virtual okay.Weigh her self daily

## 2018-05-22 NOTE — Telephone Encounter (Signed)
Can you please call this patient and see if she thinks she is she is having an allergic type reaction and I can send some prednisone in?

## 2018-05-22 NOTE — Telephone Encounter (Signed)
Patient states that prednisone causes her ankles to swell and her ankles are already swollen. She stopped the medication 3 weeks ago and she is still having the symptoms of swollen ankles, numbness and tingling

## 2018-05-23 ENCOUNTER — Other Ambulatory Visit: Payer: Self-pay | Admitting: Physician Assistant

## 2018-05-23 DIAGNOSIS — G44099 Other trigeminal autonomic cephalgias (TAC), not intractable: Secondary | ICD-10-CM

## 2018-05-23 DIAGNOSIS — G629 Polyneuropathy, unspecified: Secondary | ICD-10-CM

## 2018-05-23 MED ORDER — FUROSEMIDE 20 MG PO TABS: 20.0000 mg | ORAL_TABLET | Freq: Every day | ORAL | 3 refills | Status: DC

## 2018-05-23 NOTE — Telephone Encounter (Signed)
Patient states she has asked her oncologist and they do not think the ankle swelling is coming from anything they are prescribing.  They are changing her medication from Arimidex to Femara due to side effects.   She does not know of any problems she has with taking Lasix, requesting [facility] [place].  Scheduled patient for a virtual OV with Particia Nearing, PA on Monday 05/28/2018.  Advised her to weigh herself daily and call if she has a 3lb or more weight gain in 1 day.  Patient agreeable.

## 2018-05-23 NOTE — Telephone Encounter (Signed)
Sent to [facility]  lasix

## 2018-05-23 NOTE — Telephone Encounter (Signed)
Patient notified

## 2018-05-23 NOTE — Telephone Encounter (Signed)
Please advise 

## 2018-05-28 ENCOUNTER — Other Ambulatory Visit: Payer: Self-pay

## 2018-05-28 ENCOUNTER — Telehealth (INDEPENDENT_AMBULATORY_CARE_PROVIDER_SITE_OTHER): Payer: Medicare Other | Admitting: Physician Assistant

## 2018-05-28 DIAGNOSIS — Z170 Estrogen receptor positive status [ER+]: Secondary | ICD-10-CM | POA: Diagnosis not present

## 2018-05-28 DIAGNOSIS — E7800 Pure hypercholesterolemia, unspecified: Secondary | ICD-10-CM | POA: Diagnosis not present

## 2018-05-28 DIAGNOSIS — R609 Edema, unspecified: Secondary | ICD-10-CM

## 2018-05-28 DIAGNOSIS — J449 Chronic obstructive pulmonary disease, unspecified: Secondary | ICD-10-CM | POA: Diagnosis not present

## 2018-05-28 DIAGNOSIS — C50412 Malignant neoplasm of upper-outer quadrant of left female breast: Secondary | ICD-10-CM

## 2018-05-28 NOTE — Progress Notes (Signed)
Telephone visit  Subjective: CC: COPD, breast cancer. hyperlipidemia PCP: Terald Sleeper, PA-C NID:[id number] Joanne Kramer is a 73 y.o. female calls for telephone consult today. Patient provides verbal consent for consult held via phone.  At this time the entire area is on COVID-19 social distance seen and stay home orders.  Patient is of higher risk and therefore we are performing this by a virtual means.  Location of patient: home Location of provider: [facility] Others present for call: no     ROS: Per HPI  No Known Allergies Past Medical History:  Diagnosis Date  . Breast cancer ([place])   . Contact dermatitis    chronic itching but skin biopsy did not show anything specific  . COPD (chronic obstructive pulmonary disease) ([place])   . Emphysema of lung ([place])   . Headache   . OA (osteoarthritis) of knee    right  . Pneumonia   . Requires supplemental oxygen     Current Outpatient Medications:  .  Calcium Carb-Cholecalciferol (CALCIUM 600+D3 PO), Take 1 tablet by mouth daily., Disp: , Rfl:  .  calcium carbonate (TUMS EX) 750 MG chewable tablet, Chew 1-2 tablets by mouth 3 (three) times daily as needed (for heartburn/indigestion.). , Disp: , Rfl:  .  ergocalciferol (VITAMIN D2) 1.25 MG (50000 UT) capsule, Take 1 capsule (50,000 Units total) by mouth once a week., Disp: 30 capsule, Rfl: 1 .  furosemide (LASIX) 20 MG tablet, Take 1 tablet (20 mg total) by mouth daily., Disp: 30 tablet, Rfl: 3 .  gabapentin (NEURONTIN) 100 MG capsule, TAKE 1 TO 3 CAPSULES (100-300 MG TOTAL) BY MOUTH 3 (THREE) TIMES DAILY., Disp: 540 capsule, Rfl: 1 .  Ipratropium-Albuterol (COMBIVENT RESPIMAT) 20-100 MCG/ACT AERS respimat, Inhale 1 puff into the lungs every 4 (four) hours as needed ((typically 4x's daily))., Disp: 4 g, Rfl: 11 .  letrozole (FEMARA) 2.5 MG tablet, Take 1 tablet (2.5 mg total) by mouth daily., Disp: 30 tablet, Rfl: 3 .  Melatonin 5 MG TABS, Take 5 mg by mouth at bedtime as needed (for sleep.). ,  Disp: , Rfl:  .  Multiple Vitamin (MULTIVITAMIN WITH MINERALS) TABS tablet, Take 1 tablet by mouth daily., Disp: , Rfl:  .  naproxen sodium (ALEVE) 220 MG tablet, Take 440 mg by mouth daily as needed (pain)., Disp: , Rfl:  .  OXYGEN, Inhale 2 L into the lungs at bedtime., Disp: , Rfl:  .  predniSONE (STERAPRED UNI-PAK 21 TAB) 10 MG (21) TBPK tablet, As directed x 6 days, Disp: 21 tablet, Rfl: 0 .  simvastatin (ZOCOR) 20 MG tablet, Take 1 tablet (20 mg total) by mouth at bedtime., Disp: 90 tablet, Rfl: 1 .  triamcinolone cream (KENALOG) 0.1 %, Apply 1 application topically 2 (two) times daily as needed (for skin irritation/contact dermatitis)., Disp: 453.6 g, Rfl: 1 .  WIXELA INHUB 250-50 MCG/DOSE AEPB, INHALE 1 PUFF TWICE A DAY AS DIRECTED, Disp: 180 each, Rfl: 1  Assessment/ Plan: 73 y.o. female   1. Chronic obstructive pulmonary disease, unspecified COPD type ([place]) Continue medications: combivent  2. Malignant neoplasm of upper-outer quadrant of left breast in female, estrogen receptor positive ([place]) Continue oncology Currently starting Femara  3. Pure hypercholesterolemia Continue simvastatin  4. Edema Take 2 lasix daily for 3 days, then hold Call if not better   Start time: 11:35 am End time: 11:51 am  No orders of the defined types were placed in this encounter.   Particia Nearing PA-C [place] [phone]  [phone]  

## 2018-05-29 DIAGNOSIS — J449 Chronic obstructive pulmonary disease, unspecified: Secondary | ICD-10-CM | POA: Diagnosis not present

## 2018-06-01 ENCOUNTER — Telehealth: Payer: Self-pay | Admitting: Physician Assistant

## 2018-06-01 ENCOUNTER — Other Ambulatory Visit: Payer: Self-pay | Admitting: Physician Assistant

## 2018-06-01 MED ORDER — PREDNISONE 10 MG PO TABS: 10.0000 mg | ORAL_TABLET | Freq: Every day | ORAL | 1 refills | Status: DC

## 2018-06-01 NOTE — Telephone Encounter (Signed)
Patient aware and states she would like for [facility] to personally call her. Please advise

## 2018-06-01 NOTE — Telephone Encounter (Signed)
I called and left her voicemail.  I will try to call back in a little bit.  I did tell her to take the Lasix back down to 1 daily and to continue the prednisone at 10 mg a day.

## 2018-06-01 NOTE — Telephone Encounter (Signed)
This sounds good.  Please just complete the prednisone pack and go back down to 1 a day of the furosemide.  Please let us know if symptoms return.

## 2018-06-01 NOTE — Telephone Encounter (Signed)
Pt is taking 10mg  prednisone 1 PO daily (not the way it is rx'd) and has already taken 3 days of lasix BID. She wants to confirm whether or not she should continue taking lasix or just stop taking it all together? And should she continue with only taking 1 prednisone daily?

## 2018-06-01 NOTE — Telephone Encounter (Signed)
Decrease lasix now, call if it gets worse. Will send prednisone to the pharmacy

## 2018-06-12 ENCOUNTER — Other Ambulatory Visit (HOSPITAL_COMMUNITY): Payer: Medicare Other

## 2018-06-13 ENCOUNTER — Other Ambulatory Visit (HOSPITAL_COMMUNITY): Payer: Medicare Other

## 2018-06-13 ENCOUNTER — Encounter (HOSPITAL_COMMUNITY): Payer: Self-pay

## 2018-06-25 ENCOUNTER — Other Ambulatory Visit: Payer: Self-pay | Admitting: Physician Assistant

## 2018-06-25 DIAGNOSIS — J449 Chronic obstructive pulmonary disease, unspecified: Secondary | ICD-10-CM

## 2018-06-28 DIAGNOSIS — J449 Chronic obstructive pulmonary disease, unspecified: Secondary | ICD-10-CM | POA: Diagnosis not present

## 2018-07-03 ENCOUNTER — Other Ambulatory Visit: Payer: Self-pay

## 2018-07-03 ENCOUNTER — Inpatient Hospital Stay (HOSPITAL_COMMUNITY): Payer: Medicare Other | Attending: Hematology

## 2018-07-03 DIAGNOSIS — Z170 Estrogen receptor positive status [ER+]: Secondary | ICD-10-CM | POA: Insufficient documentation

## 2018-07-03 DIAGNOSIS — C50412 Malignant neoplasm of upper-outer quadrant of left female breast: Secondary | ICD-10-CM | POA: Diagnosis not present

## 2018-07-03 DIAGNOSIS — E559 Vitamin D deficiency, unspecified: Secondary | ICD-10-CM

## 2018-07-04 LAB — VITAMIN D 25 HYDROXY (VIT D DEFICIENCY, FRACTURES): Vit D, 25-Hydroxy: 43.3 ng/mL (ref 30.0–100.0)

## 2018-07-11 DIAGNOSIS — C50411 Malignant neoplasm of upper-outer quadrant of right female breast: Secondary | ICD-10-CM | POA: Diagnosis not present

## 2018-07-15 ENCOUNTER — Other Ambulatory Visit: Payer: Self-pay | Admitting: Physician Assistant

## 2018-07-15 DIAGNOSIS — G629 Polyneuropathy, unspecified: Secondary | ICD-10-CM

## 2018-07-15 DIAGNOSIS — G44099 Other trigeminal autonomic cephalgias (TAC), not intractable: Secondary | ICD-10-CM

## 2018-07-16 NOTE — Telephone Encounter (Signed)
Pharmacy comment:  REQUEST FOR 90 DAYS PRESCRIPTION. ( #810) DX Code Needed  Last RF 05/23/18 #540 w/ 1 RF

## 2018-07-17 ENCOUNTER — Other Ambulatory Visit: Payer: Self-pay | Admitting: Physician Assistant

## 2018-07-17 MED ORDER — GABAPENTIN 300 MG PO CAPS: 300.0000 mg | ORAL_CAPSULE | Freq: Three times a day (TID) | ORAL | 1 refills | Status: DC

## 2018-07-17 NOTE — Telephone Encounter (Signed)
I change the prescription to gabapentin 300 mg rather than such a high quantity of the 100 mg.  I have sent the prescription to the pharmacy.

## 2018-07-29 DIAGNOSIS — J449 Chronic obstructive pulmonary disease, unspecified: Secondary | ICD-10-CM | POA: Diagnosis not present

## 2018-08-01 ENCOUNTER — Other Ambulatory Visit (HOSPITAL_COMMUNITY): Payer: Self-pay | Admitting: Emergency Medicine

## 2018-08-01 ENCOUNTER — Other Ambulatory Visit: Payer: Self-pay

## 2018-08-01 ENCOUNTER — Other Ambulatory Visit (HOSPITAL_COMMUNITY): Payer: Self-pay | Admitting: Nurse Practitioner

## 2018-08-01 ENCOUNTER — Ambulatory Visit (HOSPITAL_COMMUNITY)
Admission: RE | Admit: 2018-08-01 | Discharge: 2018-08-01 | Disposition: A | Discharge status: Home / Self Care | Payer: Medicare Other | Source: Ambulatory Visit | Attending: Nurse Practitioner | Admitting: Nurse Practitioner

## 2018-08-01 DIAGNOSIS — C50412 Malignant neoplasm of upper-outer quadrant of left female breast: Secondary | ICD-10-CM

## 2018-08-01 DIAGNOSIS — Z13820 Encounter for screening for osteoporosis: Secondary | ICD-10-CM | POA: Insufficient documentation

## 2018-08-01 DIAGNOSIS — M810 Age-related osteoporosis without current pathological fracture: Secondary | ICD-10-CM | POA: Insufficient documentation

## 2018-08-01 DIAGNOSIS — Z170 Estrogen receptor positive status [ER+]: Secondary | ICD-10-CM | POA: Insufficient documentation

## 2018-08-02 ENCOUNTER — Inpatient Hospital Stay (HOSPITAL_COMMUNITY): Payer: Medicare Other | Attending: Hematology

## 2018-08-02 ENCOUNTER — Other Ambulatory Visit: Payer: Self-pay

## 2018-08-02 DIAGNOSIS — F17210 Nicotine dependence, cigarettes, uncomplicated: Secondary | ICD-10-CM | POA: Insufficient documentation

## 2018-08-02 DIAGNOSIS — Z170 Estrogen receptor positive status [ER+]: Secondary | ICD-10-CM

## 2018-08-02 DIAGNOSIS — M818 Other osteoporosis without current pathological fracture: Secondary | ICD-10-CM | POA: Insufficient documentation

## 2018-08-02 DIAGNOSIS — C50412 Malignant neoplasm of upper-outer quadrant of left female breast: Secondary | ICD-10-CM

## 2018-08-09 ENCOUNTER — Other Ambulatory Visit: Payer: Self-pay

## 2018-08-09 ENCOUNTER — Encounter (HOSPITAL_COMMUNITY): Payer: Self-pay | Admitting: Hematology

## 2018-08-09 ENCOUNTER — Other Ambulatory Visit (HOSPITAL_COMMUNITY)
Admission: RE | Admit: 2018-08-09 | Discharge: 2018-08-09 | Disposition: A | Discharge status: Home / Self Care | Payer: Medicare Other | Source: Ambulatory Visit | Attending: Hematology | Admitting: Hematology

## 2018-08-09 ENCOUNTER — Inpatient Hospital Stay (HOSPITAL_COMMUNITY): Payer: Medicare Other

## 2018-08-09 ENCOUNTER — Other Ambulatory Visit (HOSPITAL_COMMUNITY): Payer: Self-pay | Admitting: Hematology

## 2018-08-09 ENCOUNTER — Inpatient Hospital Stay (HOSPITAL_BASED_OUTPATIENT_CLINIC_OR_DEPARTMENT_OTHER): Payer: Medicare Other | Admitting: Hematology

## 2018-08-09 VITALS — BP 135/79 | HR 90 | Temp 98.5°F | Resp 18 | Wt 168.2 lb

## 2018-08-09 DIAGNOSIS — Z170 Estrogen receptor positive status [ER+]: Secondary | ICD-10-CM | POA: Insufficient documentation

## 2018-08-09 DIAGNOSIS — J449 Chronic obstructive pulmonary disease, unspecified: Secondary | ICD-10-CM

## 2018-08-09 DIAGNOSIS — C50412 Malignant neoplasm of upper-outer quadrant of left female breast: Secondary | ICD-10-CM

## 2018-08-09 DIAGNOSIS — M818 Other osteoporosis without current pathological fracture: Secondary | ICD-10-CM | POA: Diagnosis not present

## 2018-08-09 DIAGNOSIS — M810 Age-related osteoporosis without current pathological fracture: Secondary | ICD-10-CM | POA: Insufficient documentation

## 2018-08-09 DIAGNOSIS — F17210 Nicotine dependence, cigarettes, uncomplicated: Secondary | ICD-10-CM | POA: Diagnosis not present

## 2018-08-09 LAB — CBC WITH DIFFERENTIAL/PLATELET
Abs Immature Granulocytes: 0.01 10*3/uL (ref 0.00–0.07)
Basophils Absolute: 0 10*3/uL (ref 0.0–0.1)
Basophils Relative: 1 %
Eosinophils Absolute: 0.1 10*3/uL (ref 0.0–0.5)
Eosinophils Relative: 3 %
HCT: 50.2 % — ABNORMAL HIGH (ref 36.0–46.0)
Hemoglobin: 14.9 g/dL (ref 12.0–15.0)
Immature Granulocytes: 0 %
Lymphocytes Relative: 36 %
Lymphs Abs: 1.8 10*3/uL (ref 0.7–4.0)
MCH: 29.7 pg (ref 26.0–34.0)
MCHC: 29.7 g/dL — ABNORMAL LOW (ref 30.0–36.0)
MCV: 100 fL (ref 80.0–100.0)
Monocytes Absolute: 0.4 10*3/uL (ref 0.1–1.0)
Monocytes Relative: 9 %
Neutro Abs: 2.6 10*3/uL (ref 1.7–7.7)
Neutrophils Relative %: 51 %
Platelets: 133 10*3/uL — ABNORMAL LOW (ref 150–400)
RBC: 5.02 MIL/uL (ref 3.87–5.11)
RDW: 15.4 % (ref 11.5–15.5)
WBC: 5 10*3/uL (ref 4.0–10.5)
nRBC: 0 % (ref 0.0–0.2)

## 2018-08-09 LAB — COMPREHENSIVE METABOLIC PANEL
ALT: 10 U/L (ref 0–44)
AST: 14 U/L — ABNORMAL LOW (ref 15–41)
Albumin: 4.2 g/dL (ref 3.5–5.0)
Alkaline Phosphatase: 83 U/L (ref 38–126)
Anion gap: 8 (ref 5–15)
BUN: 12 mg/dL (ref 8–23)
CO2: 32 mmol/L (ref 22–32)
Calcium: 9.3 mg/dL (ref 8.9–10.3)
Chloride: 101 mmol/L (ref 98–111)
Creatinine, Ser: 0.58 mg/dL (ref 0.44–1.00)
GFR calc Af Amer: >60 mL/min (ref >60)
GFR calc non Af Amer: >60 mL/min (ref >60)
Glucose, Bld: 88 mg/dL (ref 70–99)
Potassium: 4.4 mmol/L (ref 3.5–5.1)
Sodium: 141 mmol/L (ref 135–145)
Total Bilirubin: 0.6 mg/dL (ref 0.3–1.2)
Total Protein: 7.3 g/dL (ref 6.5–8.1)

## 2018-08-09 MED ORDER — DENOSUMAB 60 MG/ML ~~LOC~~ SOSY
60.0000 mg | PREFILLED_SYRINGE | Freq: Once | SUBCUTANEOUS | Status: AC
  Administered 2018-08-09: 60 mg via SUBCUTANEOUS
  Filled 2018-08-09: qty 1

## 2018-08-09 NOTE — Progress Notes (Signed)
Joanne Kramer, [place] [address]   CLINIC:  Medical Oncology/Hematology  PCP:  Terald Sleeper, PA-C [place] [address] [phone]   REASON FOR VISIT:  Follow-up for breast cancer and osteoporosis.     INTERVAL HISTORY:  Joanne Kramer 73 y.o. female seen today for follow-up of her left breast cancer.  She had a bone density test done.  She is tolerating letrozole reasonably well.  She continues to have joint pains which are minor.  Appetite levels are 100%.  Energy levels are 50%.  She is able to do all her activities.  Denies any fevers, night sweats or weight loss.  Denies any hospitalizations.    REVIEW OF SYSTEMS:  Review of Systems  Respiratory: Positive for cough and shortness of breath.   Neurological: Positive for headaches.  All other systems reviewed and are negative.    PAST MEDICAL/SURGICAL HISTORY:  Past Medical History:  Diagnosis Date  . Breast cancer ([place])   . Contact dermatitis    chronic itching but skin biopsy did not show anything specific  . COPD (chronic obstructive pulmonary disease) ([place] [facility])   . Emphysema of lung ([place])   . Headache   . OA (osteoarthritis) of knee    right  . Pneumonia   . Requires supplemental oxygen    Past Surgical History:  Procedure Laterality Date  . BREAST LUMPECTOMY WITH RADIOACTIVE SEED AND SENTINEL LYMPH NODE BIOPSY Left 03/05/2018   Procedure: LEFT BREAST LUMPECTOMY WITH RADIOACTIVE SEED AND LEFT AXILLARY SENTINEL LYMPH NODE BIOPSY;  Surgeon: Rolm Bookbinder, MD;  Location: [facility];  Service: General;  Laterality: Left;  . BREAST SURGERY     left breast aspiration  . CESAREAN SECTION    . COLONOSCOPY    . EYE SURGERY    . KNEE SURGERY Right   . TONSILLECTOMY    . TOTAL KNEE ARTHROPLASTY Right 06/27/2017   Procedure: RIGHT TOTAL KNEE ARTHROPLASTY;  Surgeon: Garald Balding, MD;  Location: [facility];  Service: Orthopedics;  Laterality: Right;     SOCIAL HISTORY:   Social History   Socioeconomic History  . Marital status: Soil scientist    Spouse name: Not on file  . Number of children: 2  . Years of education: 48  . Highest education level: Not on file  Occupational History  . Occupation: Retired    Comment: Careers information officer  Social Needs  . Financial resource strain: Not hard at all  . Food insecurity    Worry: Never true    Inability: Never true  . Transportation needs    Medical: No    Non-medical: No  Tobacco Use  . Smoking status: Current Some Day Smoker    Packs/day: 0.50    Years: 55.00    Pack years: 27.50    Types: Cigarettes    Last attempt to quit: 05/07/2017    Years since quitting: 1.2  . Smokeless tobacco: Never Used  Substance and Sexual Activity  . Alcohol use: No  . Drug use: No  . Sexual activity: Not Currently  Lifestyle  . Physical activity    Days per week: 5 days    Minutes per session: 60 min  . Stress: Not at all  Relationships  . Social connections    Talks on phone: More than three times a week    Gets together: More than three times a week    Attends religious service: Never    Active  member of club or organization: No    Attends meetings of clubs or organizations: Never    Relationship status: Living with partner  . Intimate partner violence    Fear of current or ex partner: No    Emotionally abused: No    Physically abused: No    Forced sexual activity: No  Other Topics Concern  . Not on file  Social History Narrative  . Not on file    FAMILY HISTORY:  Family History  Adopted: Yes    CURRENT MEDICATIONS:  Outpatient Encounter Medications as of 08/09/2018  Medication Sig  . Calcium Carb-Cholecalciferol (CALCIUM 600+D3 PO) Take 1 tablet by mouth daily.  . calcium carbonate (TUMS EX) 750 MG chewable tablet Chew 1-2 tablets by mouth 3 (three) times daily as needed (for heartburn/indigestion.).   Marland Kitchen COMBIVENT RESPIMAT 20-100 MCG/ACT AERS respimat INHALE 1 PUFF INTO THE LUNGS EVERY 4 (FOUR)  HOURS AS NEEDED ((TYPICALLY 4X'S DAILY)).  . gabapentin (NEURONTIN) 300 MG capsule Take 1 capsule (300 mg total) by mouth 3 (three) times daily. Dx: G62.9  . letrozole (FEMARA) 2.5 MG tablet Take 1 tablet (2.5 mg total) by mouth daily.  . Melatonin 5 MG TABS Take 5 mg by mouth at bedtime as needed (for sleep.).   Marland Kitchen Multiple Vitamin (MULTIVITAMIN WITH MINERALS) TABS tablet Take 1 tablet by mouth daily.  . naproxen sodium (ALEVE) 220 MG tablet Take 440 mg by mouth daily as needed (pain).  . OXYGEN Inhale 2 L into the lungs at bedtime.  . simvastatin (ZOCOR) 20 MG tablet Take 1 tablet (20 mg total) by mouth at bedtime.  . triamcinolone cream (KENALOG) 0.1 % Apply 1 application topically 2 (two) times daily as needed (for skin irritation/contact dermatitis).  Grant Ruts INHUB 250-50 MCG/DOSE AEPB INHALE 1 PUFF TWICE A DAY AS DIRECTED  . [DISCONTINUED] ergocalciferol (VITAMIN D2) 1.25 MG (50000 UT) capsule Take 1 capsule (50,000 Units total) by mouth once a week.  . [DISCONTINUED] furosemide (LASIX) 20 MG tablet Take 1 tablet (20 mg total) by mouth daily.  . [DISCONTINUED] predniSONE (DELTASONE) 10 MG tablet Take 1 tablet (10 mg total) by mouth daily with breakfast.  . [EXPIRED] denosumab (PROLIA) injection 60 mg    No facility-administered encounter medications on file as of 08/09/2018.     ALLERGIES:  No Known Allergies   PHYSICAL EXAM:  ECOG Performance status: 1  Vitals:   08/09/18 1100  BP: 135/79  Pulse: 90  Resp: 18  Temp: 98.5 F (36.9 C)  SpO2: (!) 80%   Filed Weights   08/09/18 1100  Weight: 168 lb 4 oz (76.3 kg)    Physical Exam Vitals signs reviewed.  Constitutional:      Appearance: Normal appearance.  Cardiovascular:     Rate and Rhythm: Normal rate and regular rhythm.     Heart sounds: Normal heart sounds.  Pulmonary:     Effort: Pulmonary effort is normal.     Breath sounds: Normal breath sounds.  Abdominal:     General: There is no distension.      Palpations: Abdomen is soft. There is no mass.  Musculoskeletal:        General: No swelling.  Skin:    General: Skin is warm.  Neurological:     General: No focal deficit present.     Mental Status: She is alert and oriented to person, place, and time.  Psychiatric:        Mood and Affect: Mood normal.  Behavior: Behavior normal.    Breast exam did not show any palpable masses.  No palpable lymphadenopathy.  LABORATORY DATA:  I have reviewed the labs as listed.  CBC    Component Value Date/Time   WBC 5.0 08/09/2018 1339   RBC 5.02 08/09/2018 1339   HGB 14.9 08/09/2018 1339   HGB 12.9 05/22/2017 1603   HCT 50.2 (H) 08/09/2018 1339   HCT 41.6 05/22/2017 1603   PLT 133 (L) 08/09/2018 1339   PLT 207 05/22/2017 1603   MCV 100.0 08/09/2018 1339   MCV 96 05/22/2017 1603   MCH 29.7 08/09/2018 1339   MCHC 29.7 (L) 08/09/2018 1339   RDW 15.4 08/09/2018 1339   RDW 14.8 05/22/2017 1603   LYMPHSABS 1.8 08/09/2018 1339   LYMPHSABS 2.8 05/22/2017 1603   MONOABS 0.4 08/09/2018 1339   EOSABS 0.1 08/09/2018 1339   EOSABS 0.2 05/22/2017 1603   BASOSABS 0.0 08/09/2018 1339   BASOSABS 0.1 05/22/2017 1603   CMP Latest Ref Rng & Units 08/09/2018 02/20/2018 06/30/2017  Glucose 70 - 99 mg/dL 88 147(H) 92  BUN 8 - 23 mg/dL 12 10 8  Creatinine 0.44 - 1.00 mg/dL 0.58 0.75 0.53  Sodium 135 - 145 mmol/L 141 140 138  Potassium 3.5 - 5.1 mmol/L 4.4 3.8 4.0  Chloride 98 - 111 mmol/L 101 99 95(L)  CO2 22 - 32 mmol/L 32 33(H) 33(H)  Calcium 8.9 - 10.3 mg/dL 9.3 9.1 8.8(L)  Total Protein 6.5 - 8.1 g/dL 7.3 - -  Total Bilirubin 0.3 - 1.2 mg/dL 0.6 - -  Alkaline Phos 38 - 126 U/L 83 - -  AST 15 - 41 U/L 14(L) - -  ALT 0 - 44 U/L 10 - -       DIAGNOSTIC IMAGING:  I have independently reviewed the scans and discussed with the patient.   I have reviewed Ashley Travis LPN's note and agree with the documentation.  I personally performed a face-to-face visit, made revisions and my assessment  and plan is as follows.    ASSESSMENT & PLAN:   Malignant neoplasm of upper-outer quadrant of left breast in female, estrogen receptor positive (HCC) 1.  Stage Ia (pT1cpN0) left breast cancer: - Mammogram on 12/26/2017 shows suspicious 9 mm mass in the upper outer quadrant of the left breast.  Solitary borderline left axillary lymph node with cortical thickness of 4 mm. - Biopsy on 01/02/2018 of the left breast 2:00 consistent with invasive ductal carcinoma, grade 1-2.  Left axillary lymph node was negative for carcinoma. - Left breast lumpectomy and sentinel lymph node biopsy on 03/05/2018 shows 1.2 cm IDC, margins negative, grade 1, 0 out of 2 lymph nodes involved.  ER/PR positive, HER-2 negative, Ki-67 2%. -Anastrozole was started in February 2020.  However she had developed joint pains.  It was switched to letrozole on 05/22/2018. - She still has some joint pains but they are tolerable. - Physical exam today was within normal limits.  We have also reviewed her blood counts.  I will see her back in 6 months for follow-up.   2.  Bone density: - DEXA scan on 16 2020 shows T score of -2.9. -Vitamin D level on 07/03/2018 was 43.  She was told to take maintenance vitamin D around thousand-2000 units/day. -We had a prolonged discussion about starting her on Prolia.  She has of one dental cavity.  Otherwise she does not have any problems.  She is not planning to do anything for the cavity at   this time because her insurance does not cover it.  We talked about side effects of Prolia including rare chance of osteonecrosis of the jaw. - Because of her severe osteoporosis, she wants to proceed with Prolia at this time as she does not have any plans to see a dentist in the near future. -I have counseled her to take calcium and vitamin D supplements twice daily.  3.  Smoking history: -Patient started smoking at age 16.  At her peak she smoked 2 packs a day for 5 years.  Now she smokes half pack a day. -We  will talk to her about low-dose CT scan next visit.  Total time spent is 25 minutes with more than 50% of the time spent face-to-face discussing treatment plan, side effects of Prolia and coordination of care.    Orders placed this encounter:  Orders Placed This Encounter  Procedures  . MM DIAG BREAST TOMO BILATERAL  . CBC with Differential/Platelet  . Comprehensive metabolic panel  . SCHEDULING COMMUNICATION  . Treatment conditions      Sreedhar Katragadda, MD [facility] [phone]    

## 2018-08-09 NOTE — Progress Notes (Signed)
1224 Prolia injection ordered for Joanne Kramer today w/out Calcium level since this is her first injection per Dr. Tomie China orders.                                                                Joanne Kramer tolerated Porlia injection well without complaints or incident. Drug specific written information given to pt and reviewed with questions answered and per mit signed. Pt discharged self ambulatory in satisfactory condition

## 2018-08-09 NOTE — Patient Instructions (Addendum)
[place] at [facility] Discharge Instructions  You were seen today by Dr. Delton Coombes. He went over your recent lab results. He would like you to start taking Prolia injections. He will see you back in 6 months for labs and follow up.   Thank you for choosing [place] at [facility] to provide your oncology and hematology care.  To afford each patient quality time with our provider, please arrive at least 15 minutes before your scheduled appointment time.   If you have a lab appointment with the [facility] please come in thru the  Main Entrance and check in at the main information desk  You need to re-schedule your appointment should you arrive 10 or more minutes late.  We strive to give you quality time with our providers, and arriving late affects you and other patients whose appointments are after yours.  Also, if you no show three or more times for appointments you may be dismissed from the clinic at the providers discretion.     Again, thank you for choosing [facility].  Our hope is that these requests will decrease the amount of time that you wait before being seen by our physicians.       _____________________________________________________________  Should you have questions after your visit to [facility], please contact our office at [phone] between the hours of 8:00 a.m. and 4:30 p.m.  Voicemails left after 4:00 p.m. will not be returned until the following business day.  For prescription refill requests, have your pharmacy contact our office and allow 72 hours.    Cancer Center Support Programs:   > Cancer Support Group  2nd Tuesday of the month 1pm-2pm, Journey Room

## 2018-08-09 NOTE — Assessment & Plan Note (Addendum)
1.  Stage Ia (pT1cpN0) left breast cancer: - Mammogram on 12/26/2017 shows suspicious 9 mm mass in the upper outer quadrant of the left breast.  Solitary borderline left axillary lymph node with cortical thickness of 4 mm. - Biopsy on 01/02/2018 of the left breast 2:00 consistent with invasive ductal carcinoma, grade 1-2.  Left axillary lymph node was negative for carcinoma. - Left breast lumpectomy and sentinel lymph node biopsy on 03/05/2018 shows 1.2 cm IDC, margins negative, grade 1, 0 out of 2 lymph nodes involved.  ER/PR positive, HER-2 negative, Ki-67 2%. -Anastrozole was started in February 2020.  However she had developed joint pains.  It was switched to letrozole on 05/22/2018. - She still has some joint pains but they are tolerable. - Physical exam today was within normal limits.  We have also reviewed her blood counts.  I will see her back in 6 months for follow-up.   2.  Bone density: - DEXA scan on 16 2020 shows T score of -2.9. -Vitamin D level on 07/03/2018 was 43.  She was told to take maintenance vitamin D around thousand-2000 units/day. -We had a prolonged discussion about starting her on Prolia.  She has of one dental cavity.  Otherwise she does not have any problems.  She is not planning to do anything for the cavity at this time because her insurance does not cover it.  We talked about side effects of Prolia including rare chance of osteonecrosis of the jaw. - Because of her severe osteoporosis, she wants to proceed with Prolia at this time as she does not have any plans to see a dentist in the near future. -I have counseled her to take calcium and vitamin D supplements twice daily.  3.  Smoking history: -Patient started smoking at age 73.  At her peak she smoked 2 packs a day for 5 years.  Now she smokes half pack a day. -We will talk to her about low-dose CT scan next visit.

## 2018-08-12 ENCOUNTER — Other Ambulatory Visit (HOSPITAL_COMMUNITY): Payer: Self-pay | Admitting: Hematology

## 2018-08-12 DIAGNOSIS — C50412 Malignant neoplasm of upper-outer quadrant of left female breast: Secondary | ICD-10-CM

## 2018-08-20 ENCOUNTER — Ambulatory Visit (HOSPITAL_COMMUNITY): Payer: Medicare Other

## 2018-08-28 DIAGNOSIS — J449 Chronic obstructive pulmonary disease, unspecified: Secondary | ICD-10-CM | POA: Diagnosis not present

## 2018-09-28 DIAGNOSIS — J449 Chronic obstructive pulmonary disease, unspecified: Secondary | ICD-10-CM | POA: Diagnosis not present

## 2018-10-16 ENCOUNTER — Other Ambulatory Visit: Payer: Self-pay | Admitting: Physician Assistant

## 2018-10-16 DIAGNOSIS — J449 Chronic obstructive pulmonary disease, unspecified: Secondary | ICD-10-CM

## 2018-10-26 ENCOUNTER — Encounter (HOSPITAL_COMMUNITY): Payer: Self-pay | Admitting: Hematology

## 2018-10-29 DIAGNOSIS — J449 Chronic obstructive pulmonary disease, unspecified: Secondary | ICD-10-CM | POA: Diagnosis not present

## 2018-10-30 ENCOUNTER — Other Ambulatory Visit: Payer: Self-pay | Admitting: Physician Assistant

## 2018-10-30 ENCOUNTER — Other Ambulatory Visit (HOSPITAL_COMMUNITY): Payer: Self-pay | Admitting: Hematology

## 2018-10-30 ENCOUNTER — Encounter (HOSPITAL_COMMUNITY): Payer: Self-pay | Admitting: *Deleted

## 2018-10-30 DIAGNOSIS — G44099 Other trigeminal autonomic cephalgias (TAC), not intractable: Secondary | ICD-10-CM

## 2018-10-30 DIAGNOSIS — G629 Polyneuropathy, unspecified: Secondary | ICD-10-CM

## 2018-10-30 MED ORDER — TAMOXIFEN CITRATE 20 MG PO TABS: 20.0000 mg | ORAL_TABLET | Freq: Every day | ORAL | 3 refills | Status: AC

## 2018-10-30 NOTE — Progress Notes (Signed)
Request from Harriet Pho, NP to let patient know to stop the Letrazole. We will switch her to Tamoxifin.- Let her know she will need yearly pap smears. There is adverse events of uterine bleeding and/or cancer.   I will send a secure mychart message to the patient as I was unable to get her on the telephone.  Prescription has already been sent to the pharmacy.

## 2018-11-22 ENCOUNTER — Other Ambulatory Visit: Payer: Self-pay

## 2018-11-22 ENCOUNTER — Ambulatory Visit (INDEPENDENT_AMBULATORY_CARE_PROVIDER_SITE_OTHER): Payer: Medicare Other | Admitting: Family

## 2018-11-22 ENCOUNTER — Encounter: Payer: Self-pay | Admitting: Family

## 2018-11-22 DIAGNOSIS — B370 Candidal stomatitis: Secondary | ICD-10-CM | POA: Diagnosis not present

## 2018-11-22 MED ORDER — NYSTATIN 100000 UNIT/ML MT SUSP: 5.0000 mL | Freq: Four times a day (QID) | OROMUCOSAL | 0 refills | Status: DC

## 2018-11-22 NOTE — Patient Instructions (Signed)
Oral Thrush, Adult  Oral thrush, also called oral candidiasis, is a fungal infection that develops in the mouth and throat and on the tongue. It causes white patches to form on the mouth and tongue. Thrush is most common in older adults, but it can occur at any age. Many cases of thrush are mild, but this infection can also be serious. Thrush can be a repeated (recurrent) problem for certain people who have a weak body defense system (immune system). The weakness can be caused by chronic illnesses, or by taking medicines that limit the body's ability to fight infection. If a person has difficulty fighting infection, the fungus that causes thrush can spread through the body. This can cause life-threatening blood or organ infections. What are the causes? This condition is caused by a fungus (yeast) called Candida albicans.  This fungus is normally present in small amounts in the mouth and on other mucous membranes. It usually causes no harm.  If conditions are present that allow the fungus to grow without control, it invades surrounding tissues and becomes an infection.  Other Candida species can also lead to thrush (rare). What increases the risk? This condition is more likely to develop in:  People with a weakened immune system.  Older adults.  People with HIV (human immunodeficiency virus).  People with diabetes.  People with dry mouth (xerostomia).  Pregnant women.  People with poor dental care, especially people who have false teeth.  People who use antibiotic medicines. What are the signs or symptoms? Symptoms of this condition can vary from mild and moderate to severe and persistent. Symptoms may include:  A burning feeling in the mouth and throat. This can occur at the start of a thrush infection.  White patches that stick to the mouth and tongue. The tissue around the patches may be red, raw, and painful. If rubbed (during tooth brushing, for example), the patches and the  tissue of the mouth may bleed easily.  A bad taste in the mouth or difficulty tasting foods.  A cottony feeling in the mouth.  Pain during eating and swallowing.  Poor appetite.  Cracking at the corners of the mouth. How is this diagnosed? This condition is diagnosed based on:  Physical exam. Your health care provider will look in your mouth.  Health history. Your health care provider will ask you questions about your health. How is this treated? This condition is treated with medicines called antifungals, which prevent the growth of fungi. These medicines are either applied directly to the affected area (topical) or swallowed (oral). The treatment will depend on the severity of the condition. Mild thrush Mild cases of thrush may clear up with the use of an antifungal mouth rinse or lozenges. Treatment usually lasts about 14 days. Moderate to severe thrush  More severe thrush infections that have spread to the esophagus are treated with an oral antifungal medicine. A topical antifungal medicine may also be used.  For some severe infections, treatment may need to continue for more than 14 days.  Oral antifungal medicines are rarely used during pregnancy because they may be harmful to the unborn child. If you are pregnant, talk with your health care provider about options for treatment. Persistent or recurrent thrush For cases of thrush that do not go away or keep coming back:  Treatment may be needed twice as long as the symptoms last.  Treatment will include both oral and topical antifungal medicines.  People with a weakened immune system can take   an antifungal medicine on a continuous basis to prevent thrush infections. It is important to treat conditions that make a person more likely to get thrush, such as diabetes or HIV. Follow these instructions at home: Medicines  Take over-the-counter and prescription medicines only as told by your health care provider.  Talk with  your health care provider about an over-the-counter medicine called gentian violet, which kills bacteria and fungi. Relieving soreness and discomfort To help reduce the discomfort of thrush:  Drink cold liquids such as water or iced tea.  Try flavored ice treats or frozen juices.  Eat foods that are easy to swallow, such as gelatin, ice cream, or custard.  Try drinking from a straw if the patches in your mouth are painful.  General instructions  Eat plain, unflavored yogurt as directed by your health care provider. Check the label to make sure the yogurt contains live cultures. This yogurt can help healthy bacteria to grow in the mouth and can stop the growth of the fungus that causes thrush.  If you wear dentures, remove the dentures before going to bed, brush them vigorously, and soak them in a cleaning solution as directed by your health care provider.  Rinse your mouth with a warm salt-water mixture several times a day. To make a salt-water mixture, completely dissolve 1/2-1 tsp of salt in 1 cup of warm water. Contact a health care provider if:  Your symptoms are getting worse or are not improving within 7 days of starting treatment.  You have symptoms of a spreading infection, such as white patches on the skin outside of the mouth. This information is not intended to replace advice given to you by your health care provider. Make sure you discuss any questions you have with your health care provider. Document Released: 11/10/2003 Document Revised: 05/19/2017 Document Reviewed: 11/09/2015 Elsevier Patient Education  2020 Elsevier Inc.  

## 2018-11-22 NOTE — Progress Notes (Signed)
   Virtual Visit via telephone Note Due to COVID-19 pandemic this visit was conducted virtually. This visit type was conducted due to national recommendations for restrictions regarding the COVID-19 Pandemic (e.g. social distancing, sheltering in place) in an effort to limit this patient's exposure and mitigate transmission in our community. All issues noted in this document were discussed and addressed.  A physical exam was not performed with this format.  I connected with Joanne Kramer on 11/22/18 at 3:17 pm by telephone and verified that I am speaking with the correct person using two identifiers. Joanne Kramer is currently located at store and no one is currently with her during visit. The provider, Evelina Dun, FNP is located in their office at time of visit.  I discussed the limitations, risks, security and privacy concerns of performing an evaluation and management service by telephone and the availability of in person appointments. I also discussed with the patient that there may be a patient responsible charge related to this service. The patient expressed understanding and agreed to proceed.   History and Present Illness:  HPI  Pt calls the office today with complaints of white patches in her mouth that she noticed a few weeks ago. She was hoping it would go away, but it it is unchanged. She states her dentist mentioned something about thrush to her and suggested she call her PCP.  She uses [facility] daily and states she swishes afterwards. Denies any pain.    Review of Systems  HENT:       White patches on her cheeks and tongue     Observations/Objective: No SOB or distress noted   Assessment and Plan: 1. Oral thrush Swish with water after using inhaler Start nystatin today Encouraged daily yogurt  Call if symptoms worsen or do not improve  - nystatin (MYCOSTATIN) 100000 UNIT/ML suspension; Take 5 mLs (500,000 Units total) by mouth 4 (four) times daily.  Dispense: 473  mL; Refill: 0   Follow Up Instructions: As needed and keep follow up with PCP    I discussed the assessment and treatment plan with the patient. The patient was provided an opportunity to ask questions and all were answered. The patient agreed with the plan and demonstrated an understanding of the instructions.   The patient was advised to call back or seek an in-person evaluation if the symptoms worsen or if the condition fails to improve as anticipated.  The above assessment and management plan was discussed with the patient. The patient verbalized understanding of and has agreed to the management plan. Patient is aware to call the clinic if symptoms persist or worsen. Patient is aware when to return to the clinic for a follow-up visit. Patient educated on when it is appropriate to go to the emergency department.   Time call ended:  3:28 pm  I provided 11 minutes of non-face-to-face time during this encounter.    Evelina Dun, FNP

## 2018-11-26 ENCOUNTER — Other Ambulatory Visit: Payer: Self-pay | Admitting: Family

## 2018-11-26 ENCOUNTER — Encounter (HOSPITAL_COMMUNITY): Payer: Self-pay | Admitting: *Deleted

## 2018-11-26 MED ORDER — CLOTRIMAZOLE 10 MG MT TROC: 10.0000 mg | Freq: Every day | OROMUCOSAL | 2 refills | Status: DC

## 2018-11-28 ENCOUNTER — Other Ambulatory Visit: Payer: Self-pay | Admitting: Physician Assistant

## 2018-11-28 ENCOUNTER — Encounter (HOSPITAL_COMMUNITY): Payer: Self-pay | Admitting: *Deleted

## 2018-11-28 DIAGNOSIS — J449 Chronic obstructive pulmonary disease, unspecified: Secondary | ICD-10-CM | POA: Diagnosis not present

## 2018-11-28 DIAGNOSIS — E7800 Pure hypercholesterolemia, unspecified: Secondary | ICD-10-CM

## 2018-12-24 ENCOUNTER — Other Ambulatory Visit: Payer: Self-pay

## 2018-12-25 ENCOUNTER — Encounter: Payer: Self-pay | Admitting: Physician Assistant

## 2018-12-25 ENCOUNTER — Ambulatory Visit (INDEPENDENT_AMBULATORY_CARE_PROVIDER_SITE_OTHER): Payer: Medicare Other | Admitting: Physician Assistant

## 2018-12-25 VITALS — BP 123/81 | HR 87 | Temp 98.9°F | Ht 64.0 in | Wt 160.2 lb

## 2018-12-25 DIAGNOSIS — Z0001 Encounter for general adult medical examination with abnormal findings: Secondary | ICD-10-CM | POA: Diagnosis not present

## 2018-12-25 DIAGNOSIS — G629 Polyneuropathy, unspecified: Secondary | ICD-10-CM

## 2018-12-25 DIAGNOSIS — J449 Chronic obstructive pulmonary disease, unspecified: Secondary | ICD-10-CM | POA: Diagnosis not present

## 2018-12-25 DIAGNOSIS — E7800 Pure hypercholesterolemia, unspecified: Secondary | ICD-10-CM

## 2018-12-25 DIAGNOSIS — M810 Age-related osteoporosis without current pathological fracture: Secondary | ICD-10-CM

## 2018-12-25 DIAGNOSIS — Z0000 Encounter for general adult medical examination without abnormal findings: Secondary | ICD-10-CM

## 2018-12-25 DIAGNOSIS — C50412 Malignant neoplasm of upper-outer quadrant of left female breast: Secondary | ICD-10-CM

## 2018-12-25 DIAGNOSIS — Z23 Encounter for immunization: Secondary | ICD-10-CM | POA: Diagnosis not present

## 2018-12-25 DIAGNOSIS — Z170 Estrogen receptor positive status [ER+]: Secondary | ICD-10-CM

## 2018-12-25 MED ORDER — SIMVASTATIN 20 MG PO TABS: ORAL_TABLET | ORAL | 1 refills | Status: DC

## 2018-12-25 NOTE — Patient Instructions (Signed)

## 2018-12-27 ENCOUNTER — Ambulatory Visit (INDEPENDENT_AMBULATORY_CARE_PROVIDER_SITE_OTHER): Payer: Medicare Other | Admitting: *Deleted

## 2018-12-27 DIAGNOSIS — Z0001 Encounter for general adult medical examination with abnormal findings: Secondary | ICD-10-CM | POA: Diagnosis not present

## 2018-12-27 DIAGNOSIS — H9193 Unspecified hearing loss, bilateral: Secondary | ICD-10-CM

## 2018-12-27 DIAGNOSIS — Z0000 Encounter for general adult medical examination without abnormal findings: Secondary | ICD-10-CM

## 2018-12-27 NOTE — Progress Notes (Addendum)
MEDICARE ANNUAL WELLNESS VISIT  12/27/2018  Telephone Visit Disclaimer This Medicare AWV was conducted by telephone due to national recommendations for restrictions regarding the COVID-19 Pandemic (e.g. social distancing).  I verified, using two identifiers, that I am speaking with Joanne Kramer or their authorized healthcare agent. I discussed the limitations, risks, security, and privacy concerns of performing an evaluation and management service by telephone and the potential availability of an in-person appointment in the future. The patient expressed understanding and agreed to proceed.   Subjective:  Joanne Kramer is a 72 y.o. female patient of Joanne Sleeper, PA-C who had a Medicare Annual Wellness Visit today via telephone. Joanne Kramer is Retired and lives with their boyfriend. she has 2 children. she reports that she is socially active and does interact with friends/family regularly. she is minimally physically active and enjoys reading and doing word search puzzles. 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 and back into the [facility] once COVID-19 pandemic is over.  Patient Care Team: Joanne Kramer as PCP - General (Physician Assistant) Herminio Commons, MD as PCP - Cardiology (Cardiology)  Advanced Directives 12/27/2018 08/09/2018 04/11/2018 04/03/2018 02/20/2018 11/27/2017 07/17/2017  Does Patient Have a Medical Advance Directive? No No No No No No -  Does patient want to make changes to medical advance directive? - - - - No - Patient declined - -  Would patient like information on creating a medical advance directive? No - Patient declined - No - Patient declined - - Yes (MAU/Ambulatory/Procedural Areas - Information given) No - Patient declined    Hospital Utilization Over the Past 12 Months: # of hospitalizations or ER visits: 2 # of surgeries: 1  Review of Systems    Patient reports that her overall health is worse compared to last  year.  History obtained from chart review  Patient Reported Readings (BP, Pulse, CBG, Weight, etc) none  Pain Assessment Pain : No/denies pain(has some discomfort in her hands and wrists which pt says is r/t her medications for breast cancer)     Current Medications & Allergies (verified) Allergies as of 12/27/2018   No Known Allergies     Medication List       Accurate as of December 27, 2018 11:05 AM. If you have any questions, ask your nurse or doctor.        CALCIUM 600+D3 PO Take 1 tablet by mouth daily.   calcium carbonate 750 MG chewable tablet Commonly known as: TUMS EX Chew 1-2 tablets by mouth 3 (three) times daily as needed (for heartburn/indigestion.).   Combivent Respimat 20-100 MCG/ACT Aers respimat Generic drug: Ipratropium-Albuterol INHALE 1 PUFF INTO THE LUNGS EVERY 4 (FOUR) HOURS AS NEEDED ((TYPICALLY 4X'S DAILY)).   denosumab 60 MG/ML Sosy injection Commonly known as: PROLIA Inject 60 mg into the skin every 6 (six) months.   Melatonin 5 MG Tabs Take 5 mg by mouth at bedtime as needed (for sleep.).   multivitamin with minerals Tabs tablet Take 1 tablet by mouth daily.   naproxen sodium 220 MG tablet Commonly known as: ALEVE Take 440 mg by mouth daily as needed (pain).   OXYGEN Inhale 2 L into the lungs at bedtime.   simvastatin 20 MG tablet Commonly known as: ZOCOR TAKE 1 TABLET BY MOUTH EVERYDAY AT BEDTIME   tamoxifen 20 MG tablet Commonly known as: NOLVADEX Take 20 mg by mouth daily.   triamcinolone cream 0.1 % Commonly known  as: KENALOG Apply 1 application topically 2 (two) times daily as needed (for skin irritation/contact dermatitis).   Wixela Inhub 250-50 MCG/DOSE Aepb Generic drug: Fluticasone-Salmeterol INHALE 1 PUFF BY MOUTH TWICE A DAY AS DIRECTED       History (reviewed): Past Medical History:  Diagnosis Date  . Breast cancer ([place])   . Contact dermatitis    chronic itching but skin biopsy did not show anything  specific  . COPD (chronic obstructive pulmonary disease) ([place])   . Emphysema of lung ([place])   . Headache   . OA (osteoarthritis) of knee    right  . Pneumonia   . Requires supplemental oxygen    Past Surgical History:  Procedure Laterality Date  . BREAST LUMPECTOMY WITH RADIOACTIVE SEED AND SENTINEL LYMPH NODE BIOPSY Left 03/05/2018   Procedure: LEFT BREAST LUMPECTOMY WITH RADIOACTIVE SEED AND LEFT AXILLARY SENTINEL LYMPH NODE BIOPSY;  Surgeon: Rolm Bookbinder, MD;  Location: [place];  Service: General;  Laterality: Left;  . BREAST SURGERY     left breast aspiration  . CESAREAN SECTION    . COLONOSCOPY    . EYE SURGERY    . KNEE SURGERY Right   . TONSILLECTOMY    . TOTAL KNEE ARTHROPLASTY Right 06/27/2017   Procedure: RIGHT TOTAL KNEE ARTHROPLASTY;  Surgeon: Garald Balding, MD;  Location: [place];  Service: Orthopedics;  Laterality: Right;   Family History  Adopted: Yes   Social History   Socioeconomic History  . Marital status: Soil scientist    Spouse name: Not on file  . Number of children: 2  . Years of education: 16  . Highest education level: Bachelor's degree (e.g., BA, AB, BS)  Occupational History  . Occupation: Retired    Comment: Careers information officer  Social Needs  . Financial resource strain: Not hard at all  . Food insecurity    Worry: Never true    Inability: Never true  . Transportation needs    Medical: No    Non-medical: No  Tobacco Use  . Smoking status: Current Some Day Smoker    Packs/day: 0.50    Years: 55.00    Pack years: 27.50    Types: Cigarettes    Last attempt to quit: 05/07/2017    Years since quitting: 1.6  . Smokeless tobacco: Never Used  Substance and Sexual Activity  . Alcohol use: No  . Drug use: No  . Sexual activity: Not Currently  Lifestyle  . Physical activity    Days per week: 0 days    Minutes per session: 0 min  . Stress: Not at all  Relationships  . Social connections    Talks on phone: More than three times a week     Gets together: More than three times a week    Attends religious service: Never    Active member of club or organization: No    Attends meetings of clubs or organizations: Never    Relationship status: Living with partner  Other Topics Concern  . Not on file  Social History Narrative  . Not on file    Activities of Daily Living In your present state of health, do you have any difficulty performing the following activities: 12/27/2018 02/20/2018  Hearing? Y N  Comment hard of hearing-pt wants a referral to Audiology -  Vision? N N  Comment wears OTC reading glasses-gets yearly eye exam-due for one now-pt will schedule -  Difficulty concentrating or making decisions? N N  Walking or climbing stairs?  Y N  Comment when coming down the stairs due to knees-is okay on flat surfaces -  Dressing or bathing? N N  Doing errands, shopping? N -  Preparing Food and eating ? N -  Using the Toilet? N -  In the past six months, have you accidently leaked urine? N -  Do you have problems with loss of bowel control? N -  Managing your Medications? N -  Managing your Finances? N -  Housekeeping or managing your Housekeeping? Y -  Comment due to her hands, wrists and hips -  Some recent data might be hidden    Patient Education/ Literacy How often do you need to have someone help you when you read instructions, pamphlets, or other written materials from your doctor or pharmacy?: 1 - Never What is the last grade level you completed in school?: Bachelors Degree-Management  Exercise Current Exercise Habits: The patient does not participate in regular exercise at present, Exercise limited by: respiratory conditions(s)  Diet Patient reports consuming 2 meals a day and 1 snack(s) a day Patient reports that her primary diet is: Regular Patient reports that she does have regular access to food.   Depression Screen PHQ 2/9 Scores 12/27/2018 12/25/2018 02/16/2018 01/18/2018 12/14/2017 12/11/2017  11/27/2017  PHQ - 2 Score 0 0 1 2 0 0 0  PHQ- 9 Score - - 3 4 - - -     Fall Risk Fall Risk  12/27/2018 12/25/2018 02/16/2018 01/18/2018 12/14/2017  Falls in the past year? 0 0 0 0 No  Number falls in past yr: 0 - - - -  Injury with Fall? 0 - - - -     Objective:  Joanne Kramer seemed alert and oriented and she participated appropriately during our telephone visit.  Blood Pressure Weight BMI  BP Readings from Last 3 Encounters:  12/25/18 123/81  08/09/18 135/79  04/11/18 117/73   Wt Readings from Last 3 Encounters:  12/25/18 160 lb 3.2 oz (72.7 kg)  08/09/18 168 lb 4 oz (76.3 kg)  04/11/18 171 lb (77.6 kg)   BMI Readings from Last 1 Encounters:  12/25/18 27.50 kg/m    *Unable to obtain current vital signs, weight, and BMI due to telephone visit type  Hearing/Vision  . Maitri did not seem to have difficulty with hearing/understanding during the telephone conversation . Reports that she has not had a formal eye exam by an eye care professional within the past year . Reports that she has not had a formal hearing evaluation within the past year *Unable to fully assess hearing and vision during telephone visit type  Cognitive Function: 6CIT Screen 12/27/2018  What Year? 0 points  What month? 0 points  What time? 0 points  Count back from 20 0 points  Months in reverse 0 points  Repeat phrase 0 points  Total Score 0   (Normal:0-7, Significant for Dysfunction: >8)  Normal Cognitive Function Screening: Yes   Immunization & Health Maintenance Record Immunization History  Administered Date(s) Administered  . Fluad Quad(high Dose 65+) 12/25/2018  . Influenza, High Dose Seasonal PF 11/23/2016, 11/27/2017  . Pneumococcal Conjugate-13 10/02/2013  . Pneumococcal Polysaccharide-23 03/02/2009, 03/09/2016  . Tdap 03/26/2010, 11/27/2017    Health Maintenance  Topic Date Due  . Hepatitis C Screening  1945-11-06  . Fecal DNA (Cologuard)  05/07/1997  . MAMMOGRAM   12/27/2018  . TETANUS/TDAP  11/28/2027  . INFLUENZA VACCINE  Completed  . DEXA SCAN  Completed  . PNA vac Low  Risk Adult  Completed       Assessment  This is a routine wellness examination for Joanne Kramer.  Health Maintenance: Due or Overdue Health Maintenance Due  Topic Date Due  . Hepatitis C Screening  August 30, 1945  . Fecal DNA (Cologuard)  05/07/1997  . MAMMOGRAM  12/27/2018    Joanne Kramer does not need a referral for Community Assistance: Care Management:   no Social Work:    no Prescription Assistance:  no Nutrition/Diabetes Education:  no   Plan:  Personalized Goals Goals Addressed            This Visit's Progress   . DIET - INCREASE WATER INTAKE       Try to drink 6-8 glasses of water daily      Personalized Health Maintenance & Screening Recommendations  Shingles vaccine  Lung Cancer Screening Recommended: no (Low Dose CT Chest recommended if Age 39-80 years, 30 pack-year currently smoking OR have quit w/in past 15 years) Hepatitis C Screening recommended: yes-will offer at next visit with PCP HIV Screening recommended: no  Advanced Directives: Written information was not prepared per patient's request.  Referrals & Orders No orders of the defined types were placed in this encounter.   Follow-up Plan . Follow-up with Joanne Sleeper, PA-C as planned . Consider Shingles vaccine at next visit with your PCP . When you have your Advanced Directive complete bring in a copy for our records . Referral placed for Audiology per patient request   I have personally reviewed and noted the following in the patient's chart:   . Medical and social history . Use of alcohol, tobacco or illicit drugs  . Current medications and supplements . Functional ability and status . Nutritional status . Physical activity . Advanced directives . List of other physicians . Hospitalizations, surgeries, and ER visits in previous 12 months . Vitals . Screenings to  include cognitive, depression, and falls . Referrals and appointments  In addition, I have reviewed and discussed with Joanne Kramer certain preventive protocols, quality metrics, and best practice recommendations. A written personalized care plan for preventive services as well as general preventive health recommendations is available and can be mailed to the patient at her request.      Fredis Malkiewicz, Donny Pique, LPN  [phone]  I have reviewed and agree with the above AWV documentation.   Mary-Margaret Hassell Done, FNP

## 2018-12-27 NOTE — Patient Instructions (Signed)
Preventive Care 38 Years and Older, Female Preventive care refers to lifestyle choices and visits with your health care provider that can promote health and wellness. This includes:  A yearly physical exam. This is also called an annual well check.  Regular dental and eye exams.  Immunizations.  Screening for certain conditions.  Healthy lifestyle choices, such as diet and exercise. What can I expect for my preventive care visit? Physical exam Your health care provider will check:  Height and weight. These may be used to calculate body mass index (BMI), which is a measurement that tells if you are at a healthy weight.  Heart rate and blood pressure.  Your skin for abnormal spots. Counseling Your health care provider may ask you questions about:  Alcohol, tobacco, and drug use.  Emotional well-being.  Home and relationship well-being.  Sexual activity.  Eating habits.  History of falls.  Memory and ability to understand (cognition).  Work and work Statistician.  Pregnancy and menstrual history. What immunizations do I need?  Influenza (flu) vaccine  This is recommended every year. Tetanus, diphtheria, and pertussis (Tdap) vaccine  You may need a Td booster every 10 years. Varicella (chickenpox) vaccine  You may need this vaccine if you have not already been vaccinated. Zoster (shingles) vaccine  You may need this after age 71. Pneumococcal conjugate (PCV13) vaccine  One dose is recommended after age 71. Pneumococcal polysaccharide (PPSV23) vaccine  One dose is recommended after age 71. Measles, mumps, and rubella (MMR) vaccine  You may need at least one dose of MMR if you were born in 1957 or later. You may also need a second dose. Meningococcal conjugate (MenACWY) vaccine  You may need this if you have certain conditions. Hepatitis A vaccine  You may need this if you have certain conditions or if you travel or work in places where you may be exposed  to hepatitis A. Hepatitis B vaccine  You may need this if you have certain conditions or if you travel or work in places where you may be exposed to hepatitis B. Haemophilus influenzae type b (Hib) vaccine  You may need this if you have certain conditions. You may receive vaccines as individual doses or as more than one vaccine together in one shot (combination vaccines). Talk with your health care provider about the risks and benefits of combination vaccines. What tests do I need? Blood tests  Lipid and cholesterol levels. These may be checked every 5 years, or more frequently depending on your overall health.  Hepatitis C test.  Hepatitis B test. Screening  Lung cancer screening. You may have this screening every year starting at age 71 if you have a 30-pack-year history of smoking and currently smoke or have quit within the past 15 years.  Colorectal cancer screening. All adults should have this screening starting at age 71 and continuing until age 15. Your health care provider may recommend screening at age 23 if you are at increased risk. You will have tests every 1-10 years, depending on your results and the type of screening test.  Diabetes screening. This is done by checking your blood sugar (glucose) after you have not eaten for a while (fasting). You may have this done every 1-3 years.  Mammogram. This may be done every 1-2 years. Talk with your health care provider about how often you should have regular mammograms.  BRCA-related cancer screening. This may be done if you have a family history of breast, ovarian, tubal, or peritoneal cancers.  Other tests  Sexually transmitted disease (STD) testing.  Bone density scan. This is done to screen for osteoporosis. You may have this done starting at age 71. Follow these instructions at home: Eating and drinking  Eat a diet that includes fresh fruits and vegetables, whole grains, lean protein, and low-fat dairy products. Limit  your intake of foods with high amounts of sugar, saturated fats, and salt.  Take vitamin and mineral supplements as recommended by your health care provider.  Do not drink alcohol if your health care provider tells you not to drink.  If you drink alcohol: ? Limit how much you have to 0-1 drink a day. ? Be aware of how much alcohol is in your drink. In the [place] one drink equals one 12 oz bottle of beer (355 mL), one 5 oz glass of wine (148 mL), or one 1 oz glass of hard liquor (44 mL). Lifestyle  Take daily care of your teeth and gums.  Stay active. Exercise for at least 30 minutes on 5 or more days each week.  Do not use any products that contain nicotine or tobacco, such as cigarettes, e-cigarettes, and chewing tobacco. If you need help quitting, ask your health care provider.  If you are sexually active, practice safe sex. Use a condom or other form of protection in order to prevent STIs (sexually transmitted infections).  Talk with your health care provider about taking a low-dose aspirin or statin. What's next?  Go to your health care provider once a year for a well check visit.  Ask your health care provider how often you should have your eyes and teeth checked.  Stay up to date on all vaccines. This information is not intended to replace advice given to you by your health care provider. Make sure you discuss any questions you have with your health care provider. Document Released: 03/13/2015 Document Revised: 02/08/2018 Document Reviewed: 02/08/2018 Elsevier Patient Education  2020 [employer].

## 2018-12-29 DIAGNOSIS — J449 Chronic obstructive pulmonary disease, unspecified: Secondary | ICD-10-CM | POA: Diagnosis not present

## 2018-12-30 ENCOUNTER — Encounter: Payer: Self-pay | Admitting: Physician Assistant

## 2018-12-30 NOTE — Progress Notes (Signed)
BP 123/81   Pulse 87   Temp 98.9 F (37.2 C) (Temporal)   Ht 5\' 4"  (1.626 m)   Wt 160 lb 3.2 oz (72.7 kg)   SpO2 92%   BMI 27.50 kg/m    Subjective:    Patient ID: Joanne Kramer, female    DOB: 03-08-1945, 73 y.o.   MRN: [id number]  HPI: Joanne Kramer is a 73 y.o. female presenting on 12/25/2018 for Annual Exam  Patient comes in for a well check and recheck on all of her medications.  The patient does have OPD.  She reports that overall she is fairly stable with this and not having any severe flareups.  She does need to use her inhalers.  Patient has breast cancer.  She is still under the care of oncology.  They have had to do some medication changes.  She has been intolerant to some.  And overall states that she just feels still quite tired from the medication.  She also has neuropathy that was already present in the lower legs but she feels like it might be made worse by the chemotherapy. She had been taking the Neurontin but it is not really making much difference.  We have discussed the possibility of neurology evaluation for the chronic neuropathy.  Patient also known with osteoporosis, hyperlipidemia.  We will have labs and medications updated.  She denies having any other issues at this time. Past Medical History:  Diagnosis Date  . Breast cancer ([place])   . Contact dermatitis    chronic itching but skin biopsy did not show anything specific  . COPD (chronic obstructive pulmonary disease) ([place])   . Emphysema of lung ([place])   . Headache   . OA (osteoarthritis) of knee    right  . Pneumonia   . Requires supplemental oxygen    Relevant past medical, surgical, family and social history reviewed and updated as indicated. Interim medical history since our last visit reviewed. Allergies and medications reviewed and updated. DATA REVIEWED: CHART IN EPIC  Family History reviewed for pertinent findings.  Review of Systems  Constitutional: Positive for fatigue.  HENT:  Negative.   Eyes: Negative.   Respiratory: Positive for cough.   Gastrointestinal: Negative.   Genitourinary: Negative.     Allergies as of 12/25/2018   No Known Allergies     Medication List       Accurate as of December 25, 2018 11:59 PM. If you have any questions, ask your nurse or doctor.        STOP taking these medications   clotrimazole 10 MG troche Commonly known as: MYCELEX Stopped by: Terald Sleeper, PA-C   gabapentin 300 MG capsule Commonly known as: NEURONTIN Stopped by: Terald Sleeper, PA-C   letrozole 2.5 MG tablet Commonly known as: FEMARA Stopped by: Terald Sleeper, PA-C     TAKE these medications   CALCIUM 600+D3 PO Take 1 tablet by mouth daily.   calcium carbonate 750 MG chewable tablet Commonly known as: TUMS EX Chew 1-2 tablets by mouth 3 (three) times daily as needed (for heartburn/indigestion.).   Combivent Respimat 20-100 MCG/ACT Aers respimat Generic drug: Ipratropium-Albuterol INHALE 1 PUFF INTO THE LUNGS EVERY 4 (FOUR) HOURS AS NEEDED ((TYPICALLY 4X'S DAILY)).   denosumab 60 MG/ML Sosy injection Commonly known as: PROLIA Inject 60 mg into the skin every 6 (six) months.   Melatonin 5 MG Tabs Take 5 mg by mouth at bedtime as needed (for sleep.).   multivitamin with  minerals Tabs tablet Take 1 tablet by mouth daily.   naproxen sodium 220 MG tablet Commonly known as: ALEVE Take 440 mg by mouth daily as needed (pain).   OXYGEN Inhale 2 L into the lungs at bedtime.   simvastatin 20 MG tablet Commonly known as: ZOCOR TAKE 1 TABLET BY MOUTH EVERYDAY AT BEDTIME   tamoxifen 20 MG tablet Commonly known as: NOLVADEX Take 20 mg by mouth daily.   triamcinolone cream 0.1 % Commonly known as: KENALOG Apply 1 application topically 2 (two) times daily as needed (for skin irritation/contact dermatitis).   Wixela Inhub 250-50 MCG/DOSE Aepb Generic drug: Fluticasone-Salmeterol INHALE 1 PUFF BY MOUTH TWICE A DAY AS DIRECTED           Objective:    BP 123/81   Pulse 87   Temp 98.9 F (37.2 C) (Temporal)   Ht 5\' 4"  (1.626 m)   Wt 160 lb 3.2 oz (72.7 kg)   SpO2 92%   BMI 27.50 kg/m   No Known Allergies  Wt Readings from Last 3 Encounters:  12/25/18 160 lb 3.2 oz (72.7 kg)  08/09/18 168 lb 4 oz (76.3 kg)  04/11/18 171 lb (77.6 kg)    Physical Exam Constitutional:      General: She is not in acute distress.    Appearance: Normal appearance. She is well-developed.  HENT:     Head: Normocephalic and atraumatic.  Cardiovascular:     Rate and Rhythm: Normal rate.  Pulmonary:     Effort: Pulmonary effort is normal.  Skin:    General: Skin is warm and dry.     Findings: No rash.  Neurological:     Mental Status: She is alert and oriented to person, place, and time.     Deep Tendon Reflexes: Reflexes are normal and symmetric.     Results for orders placed or performed in visit on 08/09/18  Comprehensive metabolic panel  Result Value Ref Range   Sodium 141 135 - 145 mmol/L   Potassium 4.4 3.5 - 5.1 mmol/L   Chloride 101 98 - 111 mmol/L   CO2 32 22 - 32 mmol/L   Glucose, Bld 88 70 - 99 mg/dL   BUN 12 8 - 23 mg/dL   Creatinine, Ser 0.58 0.44 - 1.00 mg/dL   Calcium 9.3 8.9 - 10.3 mg/dL   Total Protein 7.3 6.5 - 8.1 g/dL   Albumin 4.2 3.5 - 5.0 g/dL   AST 14 (L) 15 - 41 U/L   ALT 10 0 - 44 U/L   Alkaline Phosphatase 83 38 - 126 U/L   Total Bilirubin 0.6 0.3 - 1.2 mg/dL   GFR calc non Af Amer >60 >60 mL/min   GFR calc Af Amer >60 >60 mL/min   Anion gap 8 5 - 15  CBC with Differential/Platelet  Result Value Ref Range   WBC 5.0 4.0 - 10.5 K/uL   RBC 5.02 3.87 - 5.11 MIL/uL   Hemoglobin 14.9 12.0 - 15.0 g/dL   HCT 50.2 (H) 36.0 - 46.0 %   MCV 100.0 80.0 - 100.0 fL   MCH 29.7 26.0 - 34.0 pg   MCHC 29.7 (L) 30.0 - 36.0 g/dL   RDW 15.4 11.5 - 15.5 %   Platelets 133 (L) 150 - 400 K/uL   nRBC 0.0 0.0 - 0.2 %   Neutrophils Relative % 51 %   Neutro Abs 2.6 1.7 - 7.7 K/uL   Lymphocytes Relative 36 %    Lymphs Abs 1.8  0.7 - 4.0 K/uL   Monocytes Relative 9 %   Monocytes Absolute 0.4 0.1 - 1.0 K/uL   Eosinophils Relative 3 %   Eosinophils Absolute 0.1 0.0 - 0.5 K/uL   Basophils Relative 1 %   Basophils Absolute 0.0 0.0 - 0.1 K/uL   Immature Granulocytes 0 %   Abs Immature Granulocytes 0.01 0.00 - 0.07 K/uL   Platelet Morphology PAD       Assessment & Plan:   1. Well adult exam labs  2. Need for immunization against influenza - Flu Vaccine QUAD High Dose(Fluad)  3. Neuropathy - Ambulatory referral to Neurology  4. Chronic obstructive pulmonary disease, unspecified COPD type ([place])  5. Pure hypercholesterolemia - simvastatin (ZOCOR) 20 MG tablet; TAKE 1 TABLET BY MOUTH EVERYDAY AT BEDTIME  Dispense: 90 tablet; Refill: 1  6. Malignant neoplasm of upper-outer quadrant of left breast in female, estrogen receptor positive ([place]) - tamoxifen (NOLVADEX) 20 MG tablet; Take 20 mg by mouth daily.  7. Age-related osteoporosis without current pathological fracture - denosumab (PROLIA) 60 MG/ML SOSY injection; Inject 60 mg into the skin every 6 (six) months.    Continue all other maintenance medications as listed above.  Follow up plan: No follow-ups on file.  Educational handout given for [place] PA-C [place] [address]  [place], [place] [address] [phone]   12/30/2018, 10:12 PM

## 2019-01-01 ENCOUNTER — Ambulatory Visit (HOSPITAL_COMMUNITY)
Admission: RE | Admit: 2019-01-01 | Discharge: 2019-01-01 | Disposition: A | Discharge status: Home / Self Care | Payer: Medicare Other | Source: Ambulatory Visit | Attending: Hematology | Admitting: Hematology

## 2019-01-01 ENCOUNTER — Ambulatory Visit (HOSPITAL_COMMUNITY): Payer: Medicare Other

## 2019-01-01 ENCOUNTER — Encounter (HOSPITAL_COMMUNITY): Payer: Self-pay

## 2019-01-01 ENCOUNTER — Other Ambulatory Visit: Payer: Self-pay

## 2019-01-01 DIAGNOSIS — C50412 Malignant neoplasm of upper-outer quadrant of left female breast: Secondary | ICD-10-CM

## 2019-01-01 DIAGNOSIS — N6321 Unspecified lump in the left breast, upper outer quadrant: Secondary | ICD-10-CM | POA: Diagnosis not present

## 2019-01-01 DIAGNOSIS — Z853 Personal history of malignant neoplasm of breast: Secondary | ICD-10-CM | POA: Diagnosis not present

## 2019-01-01 DIAGNOSIS — Z170 Estrogen receptor positive status [ER+]: Secondary | ICD-10-CM | POA: Insufficient documentation

## 2019-01-01 DIAGNOSIS — R928 Other abnormal and inconclusive findings on diagnostic imaging of breast: Secondary | ICD-10-CM | POA: Diagnosis not present

## 2019-01-07 ENCOUNTER — Other Ambulatory Visit: Payer: Self-pay | Admitting: Physician Assistant

## 2019-01-07 DIAGNOSIS — J449 Chronic obstructive pulmonary disease, unspecified: Secondary | ICD-10-CM

## 2019-01-09 ENCOUNTER — Encounter: Payer: Self-pay | Admitting: Neurology

## 2019-01-09 ENCOUNTER — Telehealth: Payer: Self-pay | Admitting: *Deleted

## 2019-01-09 ENCOUNTER — Ambulatory Visit: Payer: Medicare Other | Admitting: Neurology

## 2019-01-09 NOTE — Telephone Encounter (Signed)
No showed new patient appointment. 

## 2019-01-20 ENCOUNTER — Other Ambulatory Visit (HOSPITAL_COMMUNITY): Payer: Self-pay | Admitting: Hematology

## 2019-01-20 DIAGNOSIS — Z170 Estrogen receptor positive status [ER+]: Secondary | ICD-10-CM

## 2019-01-20 DIAGNOSIS — C50412 Malignant neoplasm of upper-outer quadrant of left female breast: Secondary | ICD-10-CM

## 2019-01-23 DIAGNOSIS — Z170 Estrogen receptor positive status [ER+]: Secondary | ICD-10-CM | POA: Diagnosis not present

## 2019-01-23 DIAGNOSIS — C50412 Malignant neoplasm of upper-outer quadrant of left female breast: Secondary | ICD-10-CM | POA: Diagnosis not present

## 2019-01-28 DIAGNOSIS — J449 Chronic obstructive pulmonary disease, unspecified: Secondary | ICD-10-CM | POA: Diagnosis not present

## 2019-01-30 ENCOUNTER — Other Ambulatory Visit (HOSPITAL_COMMUNITY): Payer: Self-pay

## 2019-01-30 DIAGNOSIS — E559 Vitamin D deficiency, unspecified: Secondary | ICD-10-CM

## 2019-01-30 DIAGNOSIS — C50412 Malignant neoplasm of upper-outer quadrant of left female breast: Secondary | ICD-10-CM

## 2019-01-30 DIAGNOSIS — Z170 Estrogen receptor positive status [ER+]: Secondary | ICD-10-CM

## 2019-01-31 ENCOUNTER — Inpatient Hospital Stay (HOSPITAL_COMMUNITY): Payer: Medicare Other | Attending: Hematology

## 2019-01-31 DIAGNOSIS — F17210 Nicotine dependence, cigarettes, uncomplicated: Secondary | ICD-10-CM | POA: Insufficient documentation

## 2019-01-31 DIAGNOSIS — M818 Other osteoporosis without current pathological fracture: Secondary | ICD-10-CM | POA: Diagnosis not present

## 2019-01-31 DIAGNOSIS — Z170 Estrogen receptor positive status [ER+]: Secondary | ICD-10-CM | POA: Diagnosis not present

## 2019-01-31 DIAGNOSIS — C50412 Malignant neoplasm of upper-outer quadrant of left female breast: Secondary | ICD-10-CM

## 2019-01-31 DIAGNOSIS — E559 Vitamin D deficiency, unspecified: Secondary | ICD-10-CM

## 2019-01-31 LAB — COMPREHENSIVE METABOLIC PANEL
ALT: 15 U/L (ref 0–44)
AST: 19 U/L (ref 15–41)
Albumin: 3.8 g/dL (ref 3.5–5.0)
Alkaline Phosphatase: 50 U/L (ref 38–126)
Anion gap: 12 (ref 5–15)
BUN: 12 mg/dL (ref 8–23)
CO2: 28 mmol/L (ref 22–32)
Calcium: 9.6 mg/dL (ref 8.9–10.3)
Chloride: 99 mmol/L (ref 98–111)
Creatinine, Ser: 0.7 mg/dL (ref 0.44–1.00)
GFR calc Af Amer: >60 mL/min (ref >60)
GFR calc non Af Amer: >60 mL/min (ref >60)
Glucose, Bld: 94 mg/dL (ref 70–99)
Potassium: 4.3 mmol/L (ref 3.5–5.1)
Sodium: 139 mmol/L (ref 135–145)
Total Bilirubin: 0.5 mg/dL (ref 0.3–1.2)
Total Protein: 6.8 g/dL (ref 6.5–8.1)

## 2019-01-31 LAB — CBC WITH DIFFERENTIAL/PLATELET
Abs Immature Granulocytes: 0.01 10*3/uL (ref 0.00–0.07)
Basophils Absolute: 0 10*3/uL (ref 0.0–0.1)
Basophils Relative: 1 %
Eosinophils Absolute: 0.2 10*3/uL (ref 0.0–0.5)
Eosinophils Relative: 3 %
HCT: 41.9 % (ref 36.0–46.0)
Hemoglobin: 13 g/dL (ref 12.0–15.0)
Immature Granulocytes: 0 %
Lymphocytes Relative: 42 %
Lymphs Abs: 2.3 10*3/uL (ref 0.7–4.0)
MCH: 31 pg (ref 26.0–34.0)
MCHC: 31 g/dL (ref 30.0–36.0)
MCV: 99.8 fL (ref 80.0–100.0)
Monocytes Absolute: 0.5 10*3/uL (ref 0.1–1.0)
Monocytes Relative: 9 %
Neutro Abs: 2.4 10*3/uL (ref 1.7–7.7)
Neutrophils Relative %: 45 %
Platelets: 156 10*3/uL (ref 150–400)
RBC: 4.2 MIL/uL (ref 3.87–5.11)
RDW: 14.6 % (ref 11.5–15.5)
WBC: 5.4 10*3/uL (ref 4.0–10.5)
nRBC: 0 % (ref 0.0–0.2)

## 2019-01-31 LAB — VITAMIN D 25 HYDROXY (VIT D DEFICIENCY, FRACTURES): Vit D, 25-Hydroxy: 37.87 ng/mL (ref 30–100)

## 2019-02-07 ENCOUNTER — Other Ambulatory Visit: Payer: Self-pay

## 2019-02-07 ENCOUNTER — Other Ambulatory Visit (HOSPITAL_COMMUNITY): Payer: Self-pay | Admitting: Hematology

## 2019-02-07 ENCOUNTER — Inpatient Hospital Stay (HOSPITAL_COMMUNITY): Payer: Medicare Other

## 2019-02-07 ENCOUNTER — Inpatient Hospital Stay (HOSPITAL_COMMUNITY): Payer: Medicare Other | Admitting: Hematology

## 2019-02-07 VITALS — BP 122/65 | HR 85 | Temp 96.9°F | Resp 18 | Wt 161.0 lb

## 2019-02-07 DIAGNOSIS — C50412 Malignant neoplasm of upper-outer quadrant of left female breast: Secondary | ICD-10-CM

## 2019-02-07 DIAGNOSIS — Z170 Estrogen receptor positive status [ER+]: Secondary | ICD-10-CM | POA: Diagnosis not present

## 2019-02-07 DIAGNOSIS — M810 Age-related osteoporosis without current pathological fracture: Secondary | ICD-10-CM

## 2019-02-07 DIAGNOSIS — M818 Other osteoporosis without current pathological fracture: Secondary | ICD-10-CM | POA: Diagnosis not present

## 2019-02-07 MED ORDER — DENOSUMAB 60 MG/ML ~~LOC~~ SOSY
60.0000 mg | PREFILLED_SYRINGE | Freq: Once | SUBCUTANEOUS | Status: AC
  Administered 2019-02-07: 11:00:00 60 mg via SUBCUTANEOUS
  Filled 2019-02-07: qty 1

## 2019-02-07 NOTE — Assessment & Plan Note (Signed)
1.  Stage Ia (pT1cpN0) left breast cancer: - Mammogram on 12/26/2017 shows suspicious 9 mm mass in the upper outer quadrant of the left breast.  Solitary borderline left axillary lymph node with cortical thickness of 4 mm. - Biopsy on 01/02/2018 of the left breast 2:00 consistent with invasive ductal carcinoma, grade 1-2.  Left axillary lymph node was negative for carcinoma. - Left breast lumpectomy and sentinel lymph node biopsy on 03/05/2018 shows 1.2 cm IDC, margins negative, grade 1, 0 out of 2 lymph nodes involved.  ER/PR positive, HER-2 negative, Ki-67 2%. -Anastrozole was started in February 2020.  However she had developed joint pains.  It was switched to letrozole on 05/22/2018. -She continues with significant joint pain.  She was then switched to tamoxifen in November 2020.  She continues to report significant joint pain that is affecting her everyday living.  Discussed today discontinuing tamoxifen, as it is also known to cause arthritic pains, and the medication is affecting her activities of daily living.  Risk and benefits discussed. -We will continue annual mammograms and physical exams. Return to clinic in 6 months.  2.  Bone density: - DEXA scan on 16 2020 shows T score of -2.9. -Vitamin D level on 07/03/2018 was 43.  She was told to take maintenance vitamin D around thousand-2000 units/day. -We had a prolonged discussion about starting her on Prolia.  She has of one dental cavity.  Otherwise she does not have any problems.  She is not planning to do anything for the cavity at this time because her insurance does not cover it.  We talked about side effects of Prolia including rare chance of osteonecrosis of the jaw. - Because of her severe osteoporosis, she wants to proceed with Prolia at this time as she does not have any plans to see a dentist in the near future. -I have counseled her to take calcium and vitamin D supplements twice daily.  3.  Smoking history: -Patient started smoking  at age 73.  At her peak she smoked 2 packs a day for 5 years.  Now she smokes half pack a day. -We will talk to her about low-dose CT scan next visit.

## 2019-02-07 NOTE — Progress Notes (Signed)
[address] [place], [place] [address]   CLINIC:  Medical Oncology/Hematology  PCP:  Terald Sleeper, PA-C [place]  [phone]   REASON FOR VISIT:  Follow-up for Breast Cancer   CURRENT THERAPY: Aromatase inhibitor    INTERVAL HISTORY:  Joanne Kramer 73 y.o. female presents today for follow-up.  She reports overall doing fair her main concern today is joint pain.  She states her joint pain began when she was started on anastrozole.  Her joint pain continued on letrozole.  She is now currently on tamoxifen and states her joint pain is significant to severe.  The joint pain is so significant is affecting her everyday living.  She wishes to stop the medication.  She denies any change in her breasts.  No new lumps, masses, nipple inversion or discharge.    REVIEW OF SYSTEMS:  Review of Systems  Constitutional: Positive for fatigue.  HENT:  Negative.   Eyes: Negative.   Respiratory: Negative.   Cardiovascular: Negative.   Gastrointestinal: Negative.   Endocrine: Negative.   Genitourinary: Negative.    Musculoskeletal: Positive for arthralgias and myalgias.  Skin: Negative.   Neurological: Negative.   Hematological: Negative.   Psychiatric/Behavioral: Negative.      PAST MEDICAL/SURGICAL HISTORY:  Past Medical History:  Diagnosis Date  . Breast cancer ([place])   . Contact dermatitis    chronic itching but skin biopsy did not show anything specific  . COPD (chronic obstructive pulmonary disease) ([place])   . Emphysema of lung ([place])   . Headache   . OA (osteoarthritis) of knee    right  . Pneumonia   . Requires supplemental oxygen    Past Surgical History:  Procedure Laterality Date  . BREAST LUMPECTOMY WITH RADIOACTIVE SEED AND SENTINEL LYMPH NODE BIOPSY Left 03/05/2018   Procedure: LEFT BREAST LUMPECTOMY WITH RADIOACTIVE SEED AND LEFT AXILLARY SENTINEL LYMPH NODE BIOPSY;  Surgeon: Rolm Bookbinder, MD;  Location: [facility];  Service: General;  Laterality: Left;  . BREAST SURGERY     left breast aspiration  . CESAREAN SECTION    . COLONOSCOPY    . EYE SURGERY    . KNEE SURGERY Right   . TONSILLECTOMY    . TOTAL KNEE ARTHROPLASTY Right 06/27/2017   Procedure: RIGHT TOTAL KNEE ARTHROPLASTY;  Surgeon: Garald Balding, MD;  Location: [facility];  Service: Orthopedics;  Laterality: Right;     SOCIAL HISTORY:  Social History   Socioeconomic History  . Marital status: Soil scientist    Spouse name: Not on file  . Number of children: 2  . Years of education: 16  . Highest education level: Bachelor's degree (e.g., BA, AB, BS)  Occupational History  . Occupation: Retired    Comment: Careers information officer  Tobacco Use  . Smoking status: Current Some Day Smoker    Packs/day: 0.50    Years: 55.00    Pack years: 27.50    Types: Cigarettes    Last attempt to quit: 05/07/2017    Years since quitting: 1.7  . Smokeless tobacco: Never Used  Substance and Sexual Activity  . Alcohol use: No  . Drug use: No  . Sexual activity: Not Currently  Other Topics Concern  . Not on file  Social History Narrative  . Not on file   Social Determinants of Health   Financial Resource Strain: Low Risk   . Difficulty of Paying Living Expenses: Not hard at all  Food  Insecurity: No Food Insecurity  . Worried About Charity fundraiser in the Last Year: Never true  . Ran Out of Food in the Last Year: Never true  Transportation Needs: No Transportation Needs  . Lack of Transportation (Medical): No  . Lack of Transportation (Non-Medical): No  Physical Activity: Inactive  . Days of Exercise per Week: 0 days  . Minutes of Exercise per Session: 0 min  Stress: No Stress Concern Present  . Feeling of Stress : Not at all  Social Connections: Somewhat Isolated  . Frequency of Communication with Friends and Family: More than three times a week  . Frequency of Social Gatherings with Friends and Family: More than three times a week  .  Attends Religious Services: Never  . Active Member of Clubs or Organizations: No  . Attends Archivist Meetings: Never  . Marital Status: Living with partner  Intimate Partner Violence: Not At Risk  . Fear of Current or Ex-Partner: No  . Emotionally Abused: No  . Physically Abused: No  . Sexually Abused: No    FAMILY HISTORY:  Family History  Adopted: Yes    CURRENT MEDICATIONS:  Outpatient Encounter Medications as of 02/07/2019  Medication Sig  . Calcium Carb-Cholecalciferol (CALCIUM 600+D3 PO) Take 1 tablet by mouth daily.  . COMBIVENT RESPIMAT 20-100 MCG/ACT AERS respimat INHALE 1 PUFF INTO THE LUNGS EVERY 4 (FOUR) HOURS AS NEEDED ((TYPICALLY 4X'S DAILY)).  Marland Kitchen denosumab (PROLIA) 60 MG/ML SOSY injection Inject 60 mg into the skin every 6 (six) months.  . Melatonin 5 MG TABS Take 5 mg by mouth at bedtime as needed (for sleep.).   Marland Kitchen Multiple Vitamin (MULTIVITAMIN WITH MINERALS) TABS tablet Take 1 tablet by mouth daily.  . OXYGEN Inhale 2 L into the lungs at bedtime.  . simvastatin (ZOCOR) 20 MG tablet TAKE 1 TABLET BY MOUTH EVERYDAY AT BEDTIME  . tamoxifen (NOLVADEX) 20 MG tablet TAKE 1 TABLET BY MOUTH EVERY DAY  . calcium carbonate (TUMS EX) 750 MG chewable tablet Chew 1-2 tablets by mouth 3 (three) times daily as needed (for heartburn/indigestion.).   Marland Kitchen naproxen sodium (ALEVE) 220 MG tablet Take 440 mg by mouth daily as needed (pain).  . triamcinolone cream (KENALOG) 0.1 % Apply 1 application topically 2 (two) times daily as needed (for skin irritation/contact dermatitis). (Patient not taking: Reported on 02/07/2019)  . WIXELA INHUB 250-50 MCG/DOSE AEPB INHALE 1 PUFF BY MOUTH TWICE A DAY AS DIRECTED (Patient not taking: Reported on 02/07/2019)  . [EXPIRED] denosumab (PROLIA) injection 60 mg    No facility-administered encounter medications on file as of 02/07/2019.    ALLERGIES:  No Known Allergies   PHYSICAL EXAM:  ECOG Performance status: 1  Vitals:   02/07/19  1035  BP: 122/65  Pulse: 85  Resp: 18  Temp: (!) 96.9 F (36.1 C)  SpO2: 99%   Filed Weights   02/07/19 1035  Weight: 161 lb (73 kg)    Physical Exam Constitutional:      Appearance: Normal appearance.  HENT:     Head: Normocephalic.  Eyes:     Conjunctiva/sclera: Conjunctivae normal.  Cardiovascular:     Rate and Rhythm: Normal rate and regular rhythm.     Pulses: Normal pulses.     Heart sounds: Normal heart sounds.  Pulmonary:     Effort: Pulmonary effort is normal.     Breath sounds: Normal breath sounds.  Abdominal:     General: Bowel sounds are normal.  Musculoskeletal:  Cervical back: Normal range of motion.     Comments: Decreased range of motion  Skin:    General: Skin is warm.  Neurological:     General: No focal deficit present.     Mental Status: She is alert and oriented to person, place, and time.  Psychiatric:        Mood and Affect: Mood normal.        Behavior: Behavior normal.      LABORATORY DATA:  I have reviewed the labs as listed.  CBC    Component Value Date/Time   WBC 5.4 01/31/2019 1122   RBC 4.20 01/31/2019 1122   HGB 13.0 01/31/2019 1122   HGB 12.9 05/22/2017 1603   HCT 41.9 01/31/2019 1122   HCT 41.6 05/22/2017 1603   PLT 156 01/31/2019 1122   PLT 207 05/22/2017 1603   MCV 99.8 01/31/2019 1122   MCV 96 05/22/2017 1603   MCH 31.0 01/31/2019 1122   MCHC 31.0 01/31/2019 1122   RDW 14.6 01/31/2019 1122   RDW 14.8 05/22/2017 1603   LYMPHSABS 2.3 01/31/2019 1122   LYMPHSABS 2.8 05/22/2017 1603   MONOABS 0.5 01/31/2019 1122   EOSABS 0.2 01/31/2019 1122   EOSABS 0.2 05/22/2017 1603   BASOSABS 0.0 01/31/2019 1122   BASOSABS 0.1 05/22/2017 1603   CMP Latest Ref Rng & Units 01/31/2019 08/09/2018 02/20/2018  Glucose 70 - 99 mg/dL 94 88 147(H)  BUN 8 - 23 mg/dL '12 12 10  ' Creatinine 0.44 - 1.00 mg/dL 0.70 0.58 0.75  Sodium 135 - 145 mmol/L 139 141 140  Potassium 3.5 - 5.1 mmol/L 4.3 4.4 3.8  Chloride 98 - 111 mmol/L 99 101 99   CO2 22 - 32 mmol/L 28 32 33(H)  Calcium 8.9 - 10.3 mg/dL 9.6 9.3 9.1  Total Protein 6.5 - 8.1 g/dL 6.8 7.3 -  Total Bilirubin 0.3 - 1.2 mg/dL 0.5 0.6 -  Alkaline Phos 38 - 126 U/L 50 83 -  AST 15 - 41 U/L 19 14(L) -  ALT 0 - 44 U/L 15 10 -          ASSESSMENT & PLAN:   Malignant neoplasm of upper-outer quadrant of left breast in female, estrogen receptor positive ([place]) 1.  Stage Ia (pT1cpN0) left breast cancer: - Mammogram on 12/26/2017 shows suspicious 9 mm mass in the upper outer quadrant of the left breast.  Solitary borderline left axillary lymph node with cortical thickness of 4 mm. - Biopsy on 01/02/2018 of the left breast 2:00 consistent with invasive ductal carcinoma, grade 1-2.  Left axillary lymph node was negative for carcinoma. - Left breast lumpectomy and sentinel lymph node biopsy on 03/05/2018 shows 1.2 cm IDC, margins negative, grade 1, 0 out of 2 lymph nodes involved.  ER/PR positive, HER-2 negative, Ki-67 2%. -Anastrozole was started in February 2020.  However she had developed joint pains.  It was switched to letrozole on 05/22/2018. -She continues with significant joint pain.  She was then switched to tamoxifen in November 2020.  She continues to report significant joint pain that is affecting her everyday living.  Discussed today discontinuing tamoxifen, as it is also known to cause arthritic pains, and the medication is affecting her activities of daily living.  Risk and benefits discussed. -We will continue annual mammograms and physical exams. Return to clinic in 6 months.  2.  Bone density: - DEXA scan on 16 2020 shows T score of -2.9. -Vitamin D level on 07/03/2018 was 43.  She  was told to take maintenance vitamin D around thousand-2000 units/day. -We had a prolonged discussion about starting her on Prolia.  She has of one dental cavity.  Otherwise she does not have any problems.  She is not planning to do anything for the cavity at this time because her insurance  does not cover it.  We talked about side effects of Prolia including rare chance of osteonecrosis of the jaw. - Because of her severe osteoporosis, she wants to proceed with Prolia at this time as she does not have any plans to see a dentist in the near future. -I have counseled her to take calcium and vitamin D supplements twice daily.  3.  Smoking history: -Patient started smoking at age 81.  At her peak she smoked 2 packs a day for 5 years.  Now she smokes half pack a day. -We will talk to her about low-dose CT scan next visit.      Orders placed this encounter:  Orders Placed This Encounter  Procedures  . MM DIAG BREAST TOMO [place] [phone]

## 2019-02-07 NOTE — Progress Notes (Signed)
Patient taking calcium as directed.  Denied tooth, jaw, and leg pain.  No recent or upcoming dental visits.  Patient tolerated injection with no complaints voiced.  Site clean and dry with no bruising or swelling noted at site.  Band aid applied.  Vss with discharge and left ambulatory with no s/s of distress noted.  

## 2019-02-28 DIAGNOSIS — J449 Chronic obstructive pulmonary disease, unspecified: Secondary | ICD-10-CM | POA: Diagnosis not present

## 2019-03-18 ENCOUNTER — Encounter: Payer: Self-pay | Admitting: Physician Assistant

## 2019-03-19 ENCOUNTER — Other Ambulatory Visit: Payer: Self-pay | Admitting: Physician Assistant

## 2019-03-19 DIAGNOSIS — H40033 Anatomical narrow angle, bilateral: Secondary | ICD-10-CM | POA: Diagnosis not present

## 2019-03-19 DIAGNOSIS — G629 Polyneuropathy, unspecified: Secondary | ICD-10-CM

## 2019-03-19 DIAGNOSIS — H2513 Age-related nuclear cataract, bilateral: Secondary | ICD-10-CM | POA: Diagnosis not present

## 2019-03-19 NOTE — Progress Notes (Signed)
rolo

## 2019-03-28 ENCOUNTER — Other Ambulatory Visit: Payer: Self-pay | Admitting: Physician Assistant

## 2019-03-28 DIAGNOSIS — J449 Chronic obstructive pulmonary disease, unspecified: Secondary | ICD-10-CM

## 2019-03-31 DIAGNOSIS — J449 Chronic obstructive pulmonary disease, unspecified: Secondary | ICD-10-CM | POA: Diagnosis not present

## 2019-04-04 ENCOUNTER — Other Ambulatory Visit: Payer: Self-pay | Admitting: Physician Assistant

## 2019-04-04 DIAGNOSIS — J449 Chronic obstructive pulmonary disease, unspecified: Secondary | ICD-10-CM

## 2019-04-28 DIAGNOSIS — J449 Chronic obstructive pulmonary disease, unspecified: Secondary | ICD-10-CM | POA: Diagnosis not present

## 2019-05-01 ENCOUNTER — Encounter (HOSPITAL_COMMUNITY): Payer: Self-pay | Admitting: *Deleted

## 2019-05-02 ENCOUNTER — Inpatient Hospital Stay (HOSPITAL_COMMUNITY): Payer: Medicare Other | Attending: Hematology | Admitting: Nurse Practitioner

## 2019-05-02 ENCOUNTER — Other Ambulatory Visit: Payer: Self-pay

## 2019-05-02 VITALS — BP 123/75 | HR 90 | Temp 97.1°F | Resp 18 | Wt 164.0 lb

## 2019-05-02 DIAGNOSIS — M818 Other osteoporosis without current pathological fracture: Secondary | ICD-10-CM | POA: Insufficient documentation

## 2019-05-02 DIAGNOSIS — C50412 Malignant neoplasm of upper-outer quadrant of left female breast: Secondary | ICD-10-CM | POA: Insufficient documentation

## 2019-05-02 DIAGNOSIS — F17210 Nicotine dependence, cigarettes, uncomplicated: Secondary | ICD-10-CM | POA: Diagnosis not present

## 2019-05-02 DIAGNOSIS — J449 Chronic obstructive pulmonary disease, unspecified: Secondary | ICD-10-CM | POA: Insufficient documentation

## 2019-05-02 DIAGNOSIS — R52 Pain, unspecified: Secondary | ICD-10-CM

## 2019-05-02 DIAGNOSIS — Z170 Estrogen receptor positive status [ER+]: Secondary | ICD-10-CM | POA: Diagnosis not present

## 2019-05-02 MED ORDER — TRAMADOL HCL 50 MG PO TABS: 25.0000 mg | ORAL_TABLET | Freq: Three times a day (TID) | ORAL | 0 refills | Status: DC | PRN

## 2019-05-02 NOTE — Progress Notes (Signed)
[place], [place] [address]   CLINIC:  Medical Oncology/Hematology  PCP:  Terald Sleeper, PA-C [place] [address] [phone]   REASON FOR VISIT: Follow-up for breast cancer  CURRENT THERAPY: Observation   INTERVAL HISTORY:  Joanne Kramer 74 y.o. female returns for routine follow-up for breast cancer.  Patient reports she is still having severe muscle aches and joint pains.  Patient reports she stopped the antiestrogen therapy on her last visit 3 months ago.  She believes is a combination between the antiestrogen therapy and the Prolia injections.  However the pains have not subsided.  She has tried every over-the-counter medication.  She is asking for pain medications for her bad days.  She denies any new lumps or bumps present.  Patient reports she is still smoking half a pack to a pack a day.  She has noticed she has developed a little more phlegm in the back of her throat which makes it hard for her to swallow pills.  Denies any voice change or hemoptysis. Denies any nausea, vomiting, or diarrhea. Denies any new pains. Had not noticed any recent bleeding such as epistaxis, hematuria or hematochezia. Denies recent chest pain on exertion, shortness of breath on minimal exertion, pre-syncopal episodes, or palpitations. Denies any numbness or tingling in hands or feet. Denies any recent fevers, infections, or recent hospitalizations. Patient reports appetite at 100% and energy level at 50%.  She is eating well maintaining her weight at this time.    REVIEW OF SYSTEMS:  Review of Systems  Constitutional: Positive for fatigue.  Respiratory: Positive for cough and shortness of breath.   Musculoskeletal: Positive for myalgias.  Neurological: Positive for dizziness, headaches and numbness.  All other systems reviewed and are negative.    PAST MEDICAL/SURGICAL HISTORY:  Past Medical History:  Diagnosis Date  . Breast cancer ([place])   .  Contact dermatitis    chronic itching but skin biopsy did not show anything specific  . COPD (chronic obstructive pulmonary disease) ([place])   . Emphysema of lung ([place])   . Headache   . OA (osteoarthritis) of knee    right  . Pneumonia   . Requires supplemental oxygen    Past Surgical History:  Procedure Laterality Date  . BREAST LUMPECTOMY WITH RADIOACTIVE SEED AND SENTINEL LYMPH NODE BIOPSY Left 03/05/2018   Procedure: LEFT BREAST LUMPECTOMY WITH RADIOACTIVE SEED AND LEFT AXILLARY SENTINEL LYMPH NODE BIOPSY;  Surgeon: Rolm Bookbinder, MD;  Location: [facility];  Service: General;  Laterality: Left;  . BREAST SURGERY     left breast aspiration  . CESAREAN SECTION    . COLONOSCOPY    . EYE SURGERY    . KNEE SURGERY Right   . TONSILLECTOMY    . TOTAL KNEE ARTHROPLASTY Right 06/27/2017   Procedure: RIGHT TOTAL KNEE ARTHROPLASTY;  Surgeon: Garald Balding, MD;  Location: [facility];  Service: Orthopedics;  Laterality: Right;     SOCIAL HISTORY:  Social History   Socioeconomic History  . Marital status: Soil scientist    Spouse name: Not on file  . Number of children: 2  . Years of education: 16  . Highest education level: Bachelor's degree (e.g., BA, AB, BS)  Occupational History  . Occupation: Retired    Comment: Careers information officer  Tobacco Use  . Smoking status: Current Some Day Smoker    Packs/day: 0.50    Years: 55.00    Pack years: 27.50  Types: Cigarettes    Last attempt to quit: 05/07/2017    Years since quitting: 1.9  . Smokeless tobacco: Never Used  Substance and Sexual Activity  . Alcohol use: No  . Drug use: No  . Sexual activity: Not Currently  Other Topics Concern  . Not on file  Social History Narrative  . Not on file   Social Determinants of Health   Financial Resource Strain:   . Difficulty of Paying Living Expenses: Not on file  Food Insecurity:   . Worried About Charity fundraiser in the Last Year: Not on file  . Ran Out of Food in the Last Year: Not  on file  Transportation Needs:   . Lack of Transportation (Medical): Not on file  . Lack of Transportation (Non-Medical): Not on file  Physical Activity: Inactive  . Days of Exercise per Week: 0 days  . Minutes of Exercise per Session: 0 min  Stress:   . Feeling of Stress : Not on file  Social Connections:   . Frequency of Communication with Friends and Family: Not on file  . Frequency of Social Gatherings with Friends and Family: Not on file  . Attends Religious Services: Not on file  . Active Member of Clubs or Organizations: Not on file  . Attends Archivist Meetings: Not on file  . Marital Status: Not on file  Intimate Partner Violence:   . Fear of Current or Ex-Partner: Not on file  . Emotionally Abused: Not on file  . Physically Abused: Not on file  . Sexually Abused: Not on file    FAMILY HISTORY:  Family History  Adopted: Yes    CURRENT MEDICATIONS:  Outpatient Encounter Medications as of 05/02/2019  Medication Sig  . Calcium Carb-Cholecalciferol (CALCIUM 600+D3 PO) Take 1 tablet by mouth daily.  Marland Kitchen denosumab (PROLIA) 60 MG/ML SOSY injection Inject 60 mg into the skin every 6 (six) months.  . Melatonin 5 MG TABS Take 5 mg by mouth at bedtime as needed (for sleep.).   Marland Kitchen Multiple Vitamin (MULTIVITAMIN WITH MINERALS) TABS tablet Take 1 tablet by mouth daily.  . OXYGEN Inhale 2 L into the lungs at bedtime.  . simvastatin (ZOCOR) 20 MG tablet TAKE 1 TABLET BY MOUTH EVERYDAY AT BEDTIME  . WIXELA INHUB 250-50 MCG/DOSE AEPB INHALE 1 PUFF BY MOUTH TWICE A DAY AS DIRECTED  . calcium carbonate (TUMS EX) 750 MG chewable tablet Chew 1-2 tablets by mouth 3 (three) times daily as needed (for heartburn/indigestion.).   Marland Kitchen COMBIVENT RESPIMAT 20-100 MCG/ACT AERS respimat INHALE 1 PUFF INTO THE LUNGS EVERY 4 (FOUR) HOURS AS NEEDED ((TYPICALLY 4X'S DAILY)). (Patient not taking: Reported on 05/02/2019)  . naproxen sodium (ALEVE) 220 MG tablet Take 440 mg by mouth daily as needed  (pain).  . triamcinolone cream (KENALOG) 0.1 % Apply 1 application topically 2 (two) times daily as needed (for skin irritation/contact dermatitis). (Patient not taking: Reported on 02/07/2019)  . [DISCONTINUED] tamoxifen (NOLVADEX) 20 MG tablet TAKE 1 TABLET BY MOUTH EVERY DAY (Patient not taking: Reported on 05/02/2019)   No facility-administered encounter medications on file as of 05/02/2019.    ALLERGIES:  No Known Allergies   PHYSICAL EXAM:  ECOG Performance status: 1  Vitals:   05/02/19 1005  BP: 123/75  Pulse: 90  Resp: 18  Temp: (!) 97.1 F (36.2 C)  SpO2: 94%   Filed Weights   05/02/19 1005  Weight: 164 lb (74.4 kg)    Physical Exam Constitutional:  Appearance: Normal appearance. She is normal weight.  Cardiovascular:     Rate and Rhythm: Normal rate and regular rhythm.     Heart sounds: Normal heart sounds.  Pulmonary:     Effort: Pulmonary effort is normal.     Breath sounds: Normal breath sounds.  Abdominal:     General: Bowel sounds are normal.     Palpations: Abdomen is soft.  Musculoskeletal:        General: Normal range of motion.  Skin:    General: Skin is warm.  Neurological:     Mental Status: She is alert and oriented to person, place, and time. Mental status is at baseline.  Psychiatric:        Mood and Affect: Mood normal.        Behavior: Behavior normal.        Thought Content: Thought content normal.        Judgment: Judgment normal.      LABORATORY DATA:  I have reviewed the labs as listed.  CBC    Component Value Date/Time   WBC 5.4 01/31/2019 1122   RBC 4.20 01/31/2019 1122   HGB 13.0 01/31/2019 1122   HGB 12.9 05/22/2017 1603   HCT 41.9 01/31/2019 1122   HCT 41.6 05/22/2017 1603   PLT 156 01/31/2019 1122   PLT 207 05/22/2017 1603   MCV 99.8 01/31/2019 1122   MCV 96 05/22/2017 1603   MCH 31.0 01/31/2019 1122   MCHC 31.0 01/31/2019 1122   RDW 14.6 01/31/2019 1122   RDW 14.8 05/22/2017 1603   LYMPHSABS 2.3 01/31/2019  1122   LYMPHSABS 2.8 05/22/2017 1603   MONOABS 0.5 01/31/2019 1122   EOSABS 0.2 01/31/2019 1122   EOSABS 0.2 05/22/2017 1603   BASOSABS 0.0 01/31/2019 1122   BASOSABS 0.1 05/22/2017 1603   CMP Latest Ref Rng & Units 01/31/2019 08/09/2018 02/20/2018  Glucose 70 - 99 mg/dL 94 88 147(H)  BUN 8 - 23 mg/dL '12 12 10  ' Creatinine 0.44 - 1.00 mg/dL 0.70 0.58 0.75  Sodium 135 - 145 mmol/L 139 141 140  Potassium 3.5 - 5.1 mmol/L 4.3 4.4 3.8  Chloride 98 - 111 mmol/L 99 101 99  CO2 22 - 32 mmol/L 28 32 33(H)  Calcium 8.9 - 10.3 mg/dL 9.6 9.3 9.1  Total Protein 6.5 - 8.1 g/dL 6.8 7.3 -  Total Bilirubin 0.3 - 1.2 mg/dL 0.5 0.6 -  Alkaline Phos 38 - 126 U/L 50 83 -  AST 15 - 41 U/L 19 14(L) -  ALT 0 - 44 U/L 15 10 -     I personally performed a face-to-face visit.  All questions were answered to patient's stated satisfaction. Encouraged patient to call with any new concerns or questions before his next visit to the cancer center and we can certain see him sooner, if needed.      ASSESSMENT & PLAN:   Malignant neoplasm of upper-outer quadrant of left breast in female, estrogen receptor positive ([place]) 1.  Stage Ia left breast cancer: -Mammogram on 12/26/2017 showed suspicious 9 mm mass in the upper outer quadrant of the left breast.  Solitary borderline left axillary lymph node with cortical thickness of 4 mm. -Biopsy on 01/02/2018 of the left breast 2 o'clock position consistent with invasive ductal carcinoma, grade 1-2.  Left axillary lymph node was negative for carcinoma. -Left breast lumpectomy and sentinel lymph node biopsy on 03/05/2018 showed 1.2 cm IDC, margins negative, grade 1, 0/2 lymph nodes involved.  ER/PR positive,  HER-2 negative, Ki-67 2%. -Anastrozole was started in February 2020.  However she had developed joint pains.  It was switched to letrozole on 05/22/2018. -She continues with significant joint pain.  She was then switched to tamoxifen in November 2020.  She continues to report  significant joint pain that is affecting her everyday living. -She is stopped taking tamoxifen.  Risk versus benefits were discussed with the patient. -Last mammogram done on 01/01/2019 was BI-RADS Category 2 benign. -Labs done on 01/31/2019 were all WNL. -She will return to the clinic in 4 months with repeat labs.  2.  Bone density: -DEXA scan on 08/01/2018 showed a T score of -2.9 osteoporosis. -Vitamin D level on 07/03/2018 was 43. -She will continue taking calcium and vitamin D daily. -Discussion about starting Prolia however she has one dental cavity.  Patient did not want to have her tooth pulled due to insurance issues.  She wants to proceed with Prolia despite the risks of osteonecrosis of the jaw. -Prolia started on 08/09/2018. -Patient reports she feels apparently injections are causing her muscle aches and joint pains to worsen.  3.  Smoking history: -Patient started smoking at age 35.  At her peak she smoked 2 packs/day for 5 years.  She now smokes a half a pack per day. -Is recommended that she have a low-dose CT scan at her next visit.      Orders placed this encounter:  Orders Placed This Encounter  Procedures  . CT CHEST LUNG CA SCREEN LOW DOSE W/O CM  . Lactate dehydrogenase  . CBC with Differential/Platelet  . Comprehensive metabolic panel  . Vitamin B12  . VITAMIN D 25 Hydroxy (Vit-D Deficiency, Fractures)  . Folate  . TSH      Francene Finders FNP-C [place] [phone]

## 2019-05-02 NOTE — Assessment & Plan Note (Addendum)
1.  Stage Ia left breast cancer: -Mammogram on 12/26/2017 showed suspicious 9 mm mass in the upper outer quadrant of the left breast.  Solitary borderline left axillary lymph node with cortical thickness of 4 mm. -Biopsy on 01/02/2018 of the left breast 2 o'clock position consistent with invasive ductal carcinoma, grade 1-2.  Left axillary lymph node was negative for carcinoma. -Left breast lumpectomy and sentinel lymph node biopsy on 03/05/2018 showed 1.2 cm IDC, margins negative, grade 1, 0/2 lymph nodes involved.  ER/PR positive, HER-2 negative, Ki-67 2%. -Anastrozole was started in February 2020.  However she had developed joint pains.  It was switched to letrozole on 05/22/2018. -She continues with significant joint pain.  She was then switched to tamoxifen in November 2020.  She continues to report significant joint pain that is affecting her everyday living. -She is stopped taking tamoxifen.  Risk versus benefits were discussed with the patient. -Last mammogram done on 01/01/2019 was BI-RADS Category 2 benign. -Labs done on 01/31/2019 were all WNL. -She will return to the clinic in 4 months with repeat labs.  2.  Bone density: -DEXA scan on 08/01/2018 showed a T score of -2.9 osteoporosis. -Vitamin D level on 07/03/2018 was 43. -She will continue taking calcium and vitamin D daily. -Discussion about starting Prolia however she has one dental cavity.  Patient did not want to have her tooth pulled due to insurance issues.  She wants to proceed with Prolia despite the risks of osteonecrosis of the jaw. -Prolia started on 08/09/2018. -Patient reports she feels apparently injections are causing her muscle aches and joint pains to worsen.  3.  Smoking history: -Patient started smoking at age 74.  At her peak she smoked 2 packs/day for 5 years.  She now smokes a half a pack per day. -Is recommended that she have a low-dose CT scan at her next visit.

## 2019-05-03 ENCOUNTER — Encounter (HOSPITAL_COMMUNITY): Payer: Self-pay | Admitting: *Deleted

## 2019-05-13 DIAGNOSIS — R200 Anesthesia of skin: Secondary | ICD-10-CM | POA: Diagnosis not present

## 2019-05-13 DIAGNOSIS — E559 Vitamin D deficiency, unspecified: Secondary | ICD-10-CM | POA: Diagnosis not present

## 2019-05-13 DIAGNOSIS — H9190 Unspecified hearing loss, unspecified ear: Secondary | ICD-10-CM | POA: Insufficient documentation

## 2019-05-13 DIAGNOSIS — G479 Sleep disorder, unspecified: Secondary | ICD-10-CM | POA: Insufficient documentation

## 2019-05-13 DIAGNOSIS — Z79899 Other long term (current) drug therapy: Secondary | ICD-10-CM | POA: Diagnosis not present

## 2019-05-13 DIAGNOSIS — R053 Chronic cough: Secondary | ICD-10-CM | POA: Insufficient documentation

## 2019-05-13 DIAGNOSIS — G629 Polyneuropathy, unspecified: Secondary | ICD-10-CM | POA: Diagnosis not present

## 2019-05-20 DIAGNOSIS — Z79899 Other long term (current) drug therapy: Secondary | ICD-10-CM | POA: Diagnosis not present

## 2019-05-29 DIAGNOSIS — J449 Chronic obstructive pulmonary disease, unspecified: Secondary | ICD-10-CM | POA: Diagnosis not present

## 2019-05-30 DIAGNOSIS — G603 Idiopathic progressive neuropathy: Secondary | ICD-10-CM | POA: Diagnosis not present

## 2019-06-18 ENCOUNTER — Other Ambulatory Visit: Payer: Self-pay | Admitting: Family Medicine

## 2019-06-18 DIAGNOSIS — J449 Chronic obstructive pulmonary disease, unspecified: Secondary | ICD-10-CM

## 2019-06-19 ENCOUNTER — Encounter (HOSPITAL_COMMUNITY): Payer: Self-pay | Admitting: *Deleted

## 2019-06-20 ENCOUNTER — Encounter (HOSPITAL_COMMUNITY): Payer: Self-pay | Admitting: *Deleted

## 2019-06-20 ENCOUNTER — Other Ambulatory Visit (HOSPITAL_COMMUNITY): Payer: Self-pay | Admitting: Nurse Practitioner

## 2019-06-20 DIAGNOSIS — J449 Chronic obstructive pulmonary disease, unspecified: Secondary | ICD-10-CM

## 2019-06-20 MED ORDER — FLUTICASONE-SALMETEROL 250-50 MCG/DOSE IN AEPB: INHALATION_SPRAY | RESPIRATORY_TRACT | 0 refills | Status: DC

## 2019-06-20 MED ORDER — COMBIVENT RESPIMAT 20-100 MCG/ACT IN AERS: INHALATION_SPRAY | RESPIRATORY_TRACT | 2 refills | Status: DC

## 2019-06-24 DIAGNOSIS — Z72820 Sleep deprivation: Secondary | ICD-10-CM | POA: Diagnosis not present

## 2019-06-24 DIAGNOSIS — R202 Paresthesia of skin: Secondary | ICD-10-CM | POA: Diagnosis not present

## 2019-06-24 DIAGNOSIS — G5603 Carpal tunnel syndrome, bilateral upper limbs: Secondary | ICD-10-CM | POA: Diagnosis not present

## 2019-06-24 DIAGNOSIS — G629 Polyneuropathy, unspecified: Secondary | ICD-10-CM | POA: Diagnosis not present

## 2019-06-24 DIAGNOSIS — M1990 Unspecified osteoarthritis, unspecified site: Secondary | ICD-10-CM | POA: Diagnosis not present

## 2019-06-27 DIAGNOSIS — G5602 Carpal tunnel syndrome, left upper limb: Secondary | ICD-10-CM | POA: Diagnosis not present

## 2019-06-28 DIAGNOSIS — J449 Chronic obstructive pulmonary disease, unspecified: Secondary | ICD-10-CM | POA: Diagnosis not present

## 2019-07-04 ENCOUNTER — Ambulatory Visit (INDEPENDENT_AMBULATORY_CARE_PROVIDER_SITE_OTHER): Payer: Medicare Other | Admitting: Family Medicine

## 2019-07-04 ENCOUNTER — Other Ambulatory Visit: Payer: Self-pay

## 2019-07-04 ENCOUNTER — Encounter: Payer: Self-pay | Admitting: Family Medicine

## 2019-07-04 VITALS — BP 125/81 | HR 80 | Temp 97.3°F | Ht 64.0 in | Wt 167.8 lb

## 2019-07-04 DIAGNOSIS — M810 Age-related osteoporosis without current pathological fracture: Secondary | ICD-10-CM | POA: Diagnosis not present

## 2019-07-04 DIAGNOSIS — J449 Chronic obstructive pulmonary disease, unspecified: Secondary | ICD-10-CM

## 2019-07-04 DIAGNOSIS — E7800 Pure hypercholesterolemia, unspecified: Secondary | ICD-10-CM

## 2019-07-04 DIAGNOSIS — C50412 Malignant neoplasm of upper-outer quadrant of left female breast: Secondary | ICD-10-CM

## 2019-07-04 DIAGNOSIS — Z170 Estrogen receptor positive status [ER+]: Secondary | ICD-10-CM

## 2019-07-08 ENCOUNTER — Encounter: Payer: Self-pay | Admitting: Family Medicine

## 2019-07-08 NOTE — Progress Notes (Signed)
Subjective:  Patient ID: Joanne Kramer, female    DOB: Nov 22, 1945  Age: 74 y.o. MRN: [id number]  CC: Medication Refill (Previous [facility] patient)   HPI Joanne Kramer presents for patient reports that she had a lumpectomy in January 2019.  Joint pains occurred with the proposed hormone replacement therapy for her breast.  She was given Prolia and that caused similar symptoms that are ongoing.  She did not receive chemo or radiation therapy at the time of the lumpectomy.  Additionally she has had an EMG of her legs showing peripheral neuropathy.  She has had a presumptive diagnosis of carpal tunnel syndrome due to numbness in the hands.  She has recently been bracing the hands at night.  The left is improving but the right has not.  Patient in for follow-up of elevated cholesterol. Doing well without complaints on current medication. Denies side effects of statin including myalgia and arthralgia and nausea. Also in today for liver function testing. Currently no chest pain, shortness of breath or other cardiovascular related symptoms noted.  Patient has a history of osteoporosis.  She is concerned about using bisphosphonates and has had a reaction to Prolia.  Patient has been followed by pulmonology in [place].  Unfortunately he has recently retired.  She needs a new referral to the [facility] pulmonology in [place].    Depression screen [facility] 2/9 07/04/2019 12/27/2018 12/25/2018  Decreased Interest 0 0 0  Down, Depressed, Hopeless 0 0 0  PHQ - 2 Score 0 0 0  Altered sleeping - - -  Tired, decreased energy - - -  Change in appetite - - -  Feeling bad or failure about yourself  - - -  Trouble concentrating - - -  Moving slowly or fidgety/restless - - -  Suicidal thoughts - - -  PHQ-9 Score - - -    History Joanne Kramer has a past medical history of Breast cancer ([place]), Contact dermatitis, COPD (chronic obstructive pulmonary disease) ([place]), Emphysema of lung ([place]), Headache, 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   She has a past surgical history that includes Eye surgery; Cesarean section; Knee surgery (Right); Tonsillectomy; Breast surgery; Colonoscopy; Total knee arthroplasty (Right, 06/27/2017); and Breast lumpectomy with radioactive seed and sentinel lymph node biopsy (Left, 03/05/2018).   Her family history is not on file. She was adopted.She reports that she has been smoking cigarettes. She has a 27.50 pack-year smoking history. She has never used smokeless tobacco. She reports that she does not drink alcohol or use drugs.    ROS Review of Systems  Constitutional: Negative.   HENT: Negative for congestion.   Eyes: Negative for visual disturbance.  Respiratory: Negative for shortness of breath.   Cardiovascular: Negative for chest pain.  Gastrointestinal: Negative for abdominal pain, constipation, diarrhea, nausea and vomiting.  Genitourinary: Negative for difficulty urinating.  Musculoskeletal: Positive for arthralgias. Negative for myalgias.  Neurological: Negative for headaches.  Psychiatric/Behavioral: Negative for sleep disturbance.    Objective:  BP 125/81   Pulse 80   Temp (!) 97.3 F (36.3 C) (Temporal)   Ht 5\' 4"  (1.626 m)   Wt 167 lb 12.8 oz (76.1 kg)   BMI 28.80 kg/m   BP Readings from Last 3 Encounters:  07/04/19 125/81  05/02/19 123/75  02/07/19 122/65    Wt Readings from Last 3 Encounters:  07/04/19 167 lb 12.8 oz (76.1 kg)  05/02/19 164 lb (74.4 kg)  02/07/19 161 lb (  73 kg)     Physical Exam Constitutional:      General: She is not in acute distress.    Appearance: She is well-developed.  HENT:     Head: Normocephalic and atraumatic.     Right Ear: External ear normal.     Left Ear: External ear normal.     Nose: Nose normal.  Eyes:     Conjunctiva/sclera: Conjunctivae normal.     Pupils: Pupils are equal, round, and reactive to light.  Neck:       Thyroid: No thyromegaly.  Cardiovascular:     Rate and Rhythm: Normal rate and regular rhythm.     Heart sounds: Normal heart sounds. No murmur.  Pulmonary:     Effort: Pulmonary effort is normal. No respiratory distress.     Breath sounds: Normal breath sounds. No wheezing or rales.  Abdominal:     General: Bowel sounds are normal. There is no distension.     Palpations: Abdomen is soft.     Tenderness: There is no abdominal tenderness.  Musculoskeletal:     Cervical back: Normal range of motion and neck supple.  Lymphadenopathy:     Cervical: No cervical adenopathy.  Skin:    General: Skin is warm and dry.  Neurological:     Mental Status: She is alert and oriented to person, place, and time.     Deep Tendon Reflexes: Reflexes are normal and symmetric.  Psychiatric:        Behavior: Behavior normal.        Thought Content: Thought content normal.        Judgment: Judgment normal.       Assessment & Plan:   Joanne Kramer was seen today for medication refill.  Diagnoses and all orders for this visit:  Chronic obstructive pulmonary disease, unspecified COPD type ([place]) -     Ambulatory referral to Pulmonology  Pure hypercholesterolemia  Malignant neoplasm of upper-outer quadrant of left breast in female, estrogen receptor positive ([place])  Age-related osteoporosis without current pathological fracture       I have discontinued Benjamine Mola Sumpter's denosumab. I am also having her maintain her melatonin, calcium carbonate, OXYGEN, multivitamin with minerals, Calcium Carb-Cholecalciferol (CALCIUM 600+D3 PO), triamcinolone cream, naproxen sodium, simvastatin, traMADol, Combivent Respimat, Fluticasone-Salmeterol, and clotrimazole.  Allergies as of 07/04/2019   No Known Allergies     Medication List       Accurate as of Jul 04, 2019 11:59 PM. If you have any questions, ask your nurse or doctor.        STOP taking these medications   denosumab 60 MG/ML Sosy  injection Commonly known as: PROLIA Stopped by: Claretta Fraise, MD     TAKE these medications   CALCIUM 600+D3 PO Take 1 tablet by mouth daily.   calcium carbonate 750 MG chewable tablet Commonly known as: TUMS EX Chew 1-2 tablets by mouth 3 (three) times daily as needed (for heartburn/indigestion.).   clotrimazole 10 MG troche Commonly known as: MYCELEX clotrimazole 10 mg troche  TAKE 1 TABLET (10 MG TOTAL) BY MOUTH 5 (FIVE) TIMES DAILY.   Combivent Respimat 20-100 MCG/ACT Aers respimat Generic drug: Ipratropium-Albuterol INHALE 1 PUFF INTO THE LUNGS EVERY 4 (FOUR) HOURS AS NEEDED ((TYPICALLY 4X'S DAILY)).   Fluticasone-Salmeterol 250-50 MCG/DOSE Aepb Commonly known as: Wixela Inhub INHALE 1 PUFF BY MOUTH TWICE A DAY AS DIRECTED   melatonin 5 MG Tabs Take 5 mg by mouth at bedtime as needed (for sleep.).   multivitamin with minerals  Tabs tablet Take 1 tablet by mouth daily.   naproxen sodium 220 MG tablet Commonly known as: ALEVE Take 440 mg by mouth daily as needed (pain).   OXYGEN Inhale 2 L into the lungs at bedtime.   simvastatin 20 MG tablet Commonly known as: ZOCOR TAKE 1 TABLET BY MOUTH EVERYDAY AT BEDTIME   traMADol 50 MG tablet Commonly known as: ULTRAM Take 0.5 tablets (25 mg total) by mouth every 8 (eight) hours as needed.   triamcinolone cream 0.1 % Commonly known as: KENALOG Apply 1 application topically 2 (two) times daily as needed (for skin irritation/contact dermatitis).        Follow-up: Return in about 3 months (around 10/04/2019).  Claretta Fraise, M.D.

## 2019-07-17 DIAGNOSIS — G5601 Carpal tunnel syndrome, right upper limb: Secondary | ICD-10-CM | POA: Diagnosis not present

## 2019-07-29 DIAGNOSIS — J449 Chronic obstructive pulmonary disease, unspecified: Secondary | ICD-10-CM | POA: Diagnosis not present

## 2019-08-05 DIAGNOSIS — M1990 Unspecified osteoarthritis, unspecified site: Secondary | ICD-10-CM | POA: Diagnosis not present

## 2019-08-05 DIAGNOSIS — M79643 Pain in unspecified hand: Secondary | ICD-10-CM | POA: Diagnosis not present

## 2019-08-05 DIAGNOSIS — G5603 Carpal tunnel syndrome, bilateral upper limbs: Secondary | ICD-10-CM | POA: Diagnosis not present

## 2019-08-05 DIAGNOSIS — R202 Paresthesia of skin: Secondary | ICD-10-CM | POA: Diagnosis not present

## 2019-08-05 DIAGNOSIS — G629 Polyneuropathy, unspecified: Secondary | ICD-10-CM | POA: Diagnosis not present

## 2019-08-07 ENCOUNTER — Ambulatory Visit: Payer: Medicare Other | Admitting: Pulmonary Disease

## 2019-08-07 ENCOUNTER — Encounter: Payer: Self-pay | Admitting: Pulmonary Disease

## 2019-08-07 ENCOUNTER — Other Ambulatory Visit: Payer: Self-pay

## 2019-08-07 ENCOUNTER — Telehealth: Payer: Self-pay | Admitting: Pulmonary Disease

## 2019-08-07 ENCOUNTER — Ambulatory Visit (HOSPITAL_COMMUNITY)
Admission: RE | Admit: 2019-08-07 | Discharge: 2019-08-07 | Disposition: A | Discharge status: Home / Self Care | Payer: Medicare Other | Source: Ambulatory Visit | Attending: Pulmonary Disease | Admitting: Pulmonary Disease

## 2019-08-07 VITALS — BP 132/84 | HR 98 | Temp 97.0°F | Ht 64.0 in | Wt 167.0 lb

## 2019-08-07 DIAGNOSIS — J9611 Chronic respiratory failure with hypoxia: Secondary | ICD-10-CM

## 2019-08-07 DIAGNOSIS — R0902 Hypoxemia: Secondary | ICD-10-CM

## 2019-08-07 DIAGNOSIS — J449 Chronic obstructive pulmonary disease, unspecified: Secondary | ICD-10-CM | POA: Insufficient documentation

## 2019-08-07 DIAGNOSIS — Z716 Tobacco abuse counseling: Secondary | ICD-10-CM

## 2019-08-07 DIAGNOSIS — R0600 Dyspnea, unspecified: Secondary | ICD-10-CM | POA: Insufficient documentation

## 2019-08-07 DIAGNOSIS — R0609 Other forms of dyspnea: Secondary | ICD-10-CM

## 2019-08-07 MED ORDER — BREZTRI AEROSPHERE 160-9-4.8 MCG/ACT IN AERO: 2.0000 | INHALATION_SPRAY | Freq: Two times a day (BID) | RESPIRATORY_TRACT | 0 refills | Status: DC

## 2019-08-07 MED ORDER — BREZTRI AEROSPHERE 160-9-4.8 MCG/ACT IN AERO: 2.0000 | INHALATION_SPRAY | Freq: Two times a day (BID) | RESPIRATORY_TRACT | 5 refills | Status: DC

## 2019-08-07 MED ORDER — VARENICLINE TARTRATE 0.5 MG X 11 & 1 MG X 42 PO MISC: ORAL | 0 refills | Status: DC

## 2019-08-07 MED ORDER — MUCINEX 600 MG PO TB12
1200.0000 mg | ORAL_TABLET | Freq: Two times a day (BID) | ORAL | Status: DC | PRN
Start: 2019-08-07 — End: 2020-02-27

## 2019-08-07 NOTE — Progress Notes (Signed)
[facility]  Chief Complaint  Patient presents with  . Pulmonary Consult    Referred by Dr Claretta Fraise for eval of COPD. She states that her breathing has been worse recently and she relates this to the humid weather. She gets winded sometimes walking room to room. She has cough- non prod. She is using her combivent about 4 x per day.     Constitutional:  BP 132/84 (BP Location: Left Arm, Cuff Size: Normal)   Pulse 98   Temp (!) 97 F (36.1 C) (Temporal)   Ht 5\' 4"  (1.626 m)   Wt 167 lb (75.8 kg)   SpO2 93% Comment: on RA  BMI 28.67 kg/m   Past Medical History:  Lt breast cancer s/p lumpectomy January 2020, Osteoporosis, HLD, Pneumonia  Summary:  Joanne Kramer is a 74 y.o. female smoker with COPD and chronic respiratory failure.  Subjective:   She moved to [place] from [place] about 3 years ago.  She was seen by lung doctor in [place] and was diagnosed with COPD after having episode of pneumonia around 2013.  She was also seen a couple of times by Dr. Luan Pulling before he retired.  She has noticed more trouble with her breathing with exertion.  She uses 2 liters oxygen at night, but hasn't been set up for daytime oxygen.  Her SpO2 on room air today was 85%.  She was recovered on 2 liters oxygen to 95%.  She has chest congestion but has trouble bringing up phlegm.  She feels like this gets stuck in her throat.  She has intermittent wheezing also.  Denies chest pain, leg swelling, fever, hemoptysis.  Doesn't feel like she has any difficulty with her sleep.  She continues to smoke cigarettes.  Smoked up to 2 packs per day, and is now down to 1/2 pack per day.  She tried chantix years ago w/o any side effects.  She wasn't successful with smoking cessation at that time, but was only on chantix for about 2 weeks.     Physical Exam:   Appearance - well kempt  ENMT - no sinus tenderness, no nasal discharge, no oral exudate, Mallampati  2  Respiratory - b/l rhonchi that clear with coughing, prolonged exhalation, decrease breath sounds, no wheeze  CV - regular rate and rhythm, no murmurs  GI - soft, non tender  Lymph - no adenopathy noted in neck  Ext - no edema  Skin - no rashes  Neuro - normal strength, oriented x 3  Psych - normal mood and affect   Assessment/Plan:   COPD with chronic bronchitis and emphysema. - will have her switch from wixela to breztri - continue combivent prn for now - prn mucinex - will arrange for chest xray and pulmonary function test  Chronic respiratory failure with hypoxia. - will have [facility] set daytime oxygen with exertion - she is to use 2 liters with exertion and sleep  Tobacco abuse. - she is willing to try chantix again with nicotine patch - dosing schedule, side effects and setting quit date reviewed with use of chantix  A total of 47 minutes addressing patient care on the day of the visit.  Follow up:  Patient Instructions  Breztri two puffs in the morning and two puffs in the evening, and rinse your mouth after each use.  Don't use wixela while using breztri.  Combivent every 4 to 6 hours as needed for cough, wheeze, chest congestion or shortness of  breath.  Mucinex 1200 mg twice per day as needed to help loosen phlegm.  Chantix 1 pill daily for 3 days, then 1 pill twice daily.  Set your quit date to stop smoking 1 week after starting Chantix.  Will have [facility] arrange for daytime oxygen set up.  Will arrange for chest xray and pulmonary function test.  Follow up in 8 weeks.   Signature:  Chesley Mires, MD [place] Pager: [phone] 08/07/2019, 11:46 AM  Flow Sheet     Pulmonary tests:    Medications:   Allergies as of 08/07/2019      Reactions   Prolia [denosumab] Other (See Comments)   Joint pain   Anastrozole    Letrozole    Tamoxifen       Medication List       Accurate as of August 07, 2019 11:46 AM. If you  have any questions, ask your nurse or doctor.        STOP taking these medications   Fluticasone-Salmeterol 250-50 MCG/DOSE Aepb Commonly known as: Wixela [place] Stopped by: Chesley Mires, MD   simvastatin 20 MG tablet Commonly known as: ZOCOR Stopped by: Chesley Mires, MD     TAKE these medications   Breztri Aerosphere 160-9-4.8 MCG/ACT Aero Generic drug: Budeson-Glycopyrrol-Formoterol Inhale 2 puffs into the lungs 2 (two) times daily. Started by: Chesley Mires, MD   Arnell Sieving [phone] MCG/ACT Aero Generic drug: Budeson-Glycopyrrol-Formoterol Inhale 2 puffs into the lungs 2 (two) times daily. Started by: Chesley Mires, MD   CALCIUM 600+D3 PO Take 1 tablet by mouth daily.   calcium carbonate 750 MG chewable tablet Commonly known as: TUMS EX Chew 1-2 tablets by mouth 3 (three) times daily as needed (for heartburn/indigestion.).   clotrimazole 10 MG troche Commonly known as: MYCELEX clotrimazole 10 mg troche  TAKE 1 TABLET (10 MG TOTAL) BY MOUTH 5 (FIVE) TIMES DAILY.   Combivent Respimat 20-100 MCG/ACT Aers respimat Generic drug: Ipratropium-Albuterol INHALE 1 PUFF INTO THE LUNGS EVERY 4 (FOUR) HOURS AS NEEDED ((TYPICALLY 4X'S DAILY)).   melatonin 5 MG Tabs Take 5 mg by mouth at bedtime as needed (for sleep.).   Mucinex 600 MG 12 hr tablet Generic drug: guaiFENesin Take 2 tablets (1,200 mg total) by mouth 2 (two) times daily as needed for cough or to loosen phlegm. Started by: Chesley Mires, MD   multivitamin with minerals Tabs tablet Take 1 tablet by mouth daily.   naproxen sodium 220 MG tablet Commonly known as: ALEVE Take 440 mg by mouth daily as needed (pain).   OXYGEN Inhale 2 L into the lungs at bedtime.   traMADol 50 MG tablet Commonly known as: ULTRAM Take 0.5 tablets (25 mg total) by mouth every 8 (eight) hours as needed.   triamcinolone cream 0.1 % Commonly known as: KENALOG Apply 1 application topically 2 (two) times daily as needed (for skin  irritation/contact dermatitis).   varenicline 0.5 MG X 11 & 1 MG X 42 tablet Commonly known as: CHANTIX PAK Take one 0.5 mg tablet by mouth once daily for 3 days, then increase to one 0.5 mg tablet twice daily for 4 days, then increase to one 1 mg tablet twice daily. Started by: Chesley Mires, MD       Past Surgical History:  She  has a past surgical history that includes Eye surgery; Cesarean section; Knee surgery (Right); Tonsillectomy; Breast surgery; Colonoscopy; Total knee arthroplasty (Right, 06/27/2017); and Breast lumpectomy with radioactive seed and sentinel lymph node biopsy (Left, 03/05/2018).  Family History:  Her family history is not on file. She was adopted.  Social History:  She  reports that she has been smoking cigarettes. She has a 55.00 pack-year smoking history. She has never used smokeless tobacco. She reports that she does not drink alcohol or use drugs.

## 2019-08-07 NOTE — Telephone Encounter (Signed)
I spoke with Joanne Kramer  She states needs o2 order to be on o2 template  Order corrected  Nothing further needed

## 2019-08-07 NOTE — Patient Instructions (Signed)
Breztri two puffs in the morning and two puffs in the evening, and rinse your mouth after each use.  Don't use wixela while using breztri.  Combivent every 4 to 6 hours as needed for cough, wheeze, chest congestion or shortness of breath.  Mucinex 1200 mg twice per day as needed to help loosen phlegm.  Chantix 1 pill daily for 3 days, then 1 pill twice daily.  Set your quit date to stop smoking 1 week after starting Chantix.  Will have [facility] arrange for daytime oxygen set up.  Will arrange for chest xray and pulmonary function test.  Follow up in 8 weeks.

## 2019-08-08 ENCOUNTER — Telehealth: Payer: Self-pay | Admitting: Pulmonary Disease

## 2019-08-08 ENCOUNTER — Ambulatory Visit (HOSPITAL_COMMUNITY): Payer: Medicare Other

## 2019-08-08 ENCOUNTER — Ambulatory Visit (HOSPITAL_COMMUNITY): Payer: Medicare Other | Admitting: Nurse Practitioner

## 2019-08-08 NOTE — Telephone Encounter (Signed)
Called [facility] and left message for Bethanne Ginger to return call this afternoon.

## 2019-08-09 ENCOUNTER — Telehealth: Payer: Self-pay | Admitting: Pulmonary Disease

## 2019-08-09 NOTE — Telephone Encounter (Signed)
DG Chest 2 View  Result Date: 08/08/2019 CLINICAL DATA:  Dyspnea on exertion EXAM: CHEST - 2 VIEW COMPARISON:  05/22/2017 FINDINGS: Cardiac shadow is within normal limits. Mild aortic calcifications are seen. The lungs are well aerated bilaterally. No focal infiltrate or sizable effusion is seen. Postsurgical changes in the left breast are noted. No acute bony abnormality is noted. IMPRESSION: No acute abnormality seen. Electronically Signed   By: Inez Catalina M.D.   On: 08/08/2019 10:12     Please let her know her chest xray was normal.

## 2019-08-12 NOTE — Telephone Encounter (Signed)
Left message for patient to call back  

## 2019-08-12 NOTE — Telephone Encounter (Signed)
If concerned about patient using DPI, she may be a good candidate for [place] PAP for Breztri.  She would qualify if Maximum yearly gross for 1 person household <$35,000 or 2 person household< $48,000 .  Could also consider using goodrx card for generic airduo respiclick (DPI) for $71 at [facility] in combination with [facility].    There is also PAP for Chantix.    As patient is donut hole she may need asssitance with medications outside pulmonary.  Recommend referral to [facility] if pursuing multiple PAP applications as she is eligible and they will be able to assist patient with all medications.  If referred to [facility] let patient know she will be receiving a call from [place] network to help with affordable medication options. Thanks!  Museum/gallery conservator

## 2019-08-12 NOTE — Telephone Encounter (Signed)
Unable to run test claim for Breztri due to refill too soon. Ran test claim for 1 month of Trelegy and patient's copay is $160. She is in the coverage gap. Patient will need to apply for patient assistance.

## 2019-08-12 NOTE — Telephone Encounter (Signed)
Noted.  Joanne Mires, MD [place] Pager - [phone] 08/12/2019, 10:40 AM

## 2019-08-12 NOTE — Telephone Encounter (Signed)
Beth, please see pt's mychart message and advise. I am also sending this to pharmacy team for them to also review to see if they can help [place] out with it too.

## 2019-08-12 NOTE — Telephone Encounter (Signed)
Pt did send message back stating that she found out that she was in the donut hole so understood why meds were more expensive. The main issue that pt wants recommends for now is the nausea. Beth, please advise any recommendations you may have for her to help with the nausea.

## 2019-08-12 NOTE — Telephone Encounter (Signed)
Called and spoke with Bethanne Ginger in regards to the POC order. She stated that they can not provide her with a POC until her balance is paid in full. Patient is aware of this. The only O2 they can provide the patient with at this time are the regular tanks. I asked Bethanne Ginger if she were to get setup on a payment plan, could she get the POC. She stated that the balance would need to be $0.   Will route to VS so he is aware.

## 2019-08-14 NOTE — Telephone Encounter (Signed)
Make sure she is shaking inhaler well before using. Try taking with or without food to see if either one helps.

## 2019-08-16 ENCOUNTER — Other Ambulatory Visit: Payer: Self-pay

## 2019-08-16 ENCOUNTER — Other Ambulatory Visit (HOSPITAL_COMMUNITY)
Admission: RE | Admit: 2019-08-16 | Discharge: 2019-08-16 | Disposition: A | Discharge status: Home / Self Care | Payer: Medicare Other | Source: Ambulatory Visit | Attending: Pulmonary Disease | Admitting: Pulmonary Disease

## 2019-08-16 DIAGNOSIS — Z01812 Encounter for preprocedural laboratory examination: Secondary | ICD-10-CM | POA: Diagnosis not present

## 2019-08-16 DIAGNOSIS — Z20822 Contact with and (suspected) exposure to covid-19: Secondary | ICD-10-CM | POA: Insufficient documentation

## 2019-08-17 LAB — SARS CORONAVIRUS 2 (TAT 6-24 HRS): SARS Coronavirus 2: NEGATIVE

## 2019-08-20 ENCOUNTER — Other Ambulatory Visit: Payer: Self-pay

## 2019-08-20 ENCOUNTER — Ambulatory Visit (HOSPITAL_COMMUNITY)
Admission: RE | Admit: 2019-08-20 | Discharge: 2019-08-20 | Disposition: A | Discharge status: Home / Self Care | Payer: Medicare Other | Source: Ambulatory Visit | Attending: Pulmonary Disease | Admitting: Pulmonary Disease

## 2019-08-20 DIAGNOSIS — R0600 Dyspnea, unspecified: Secondary | ICD-10-CM | POA: Insufficient documentation

## 2019-08-20 DIAGNOSIS — J449 Chronic obstructive pulmonary disease, unspecified: Secondary | ICD-10-CM | POA: Diagnosis not present

## 2019-08-20 DIAGNOSIS — R0609 Other forms of dyspnea: Secondary | ICD-10-CM

## 2019-08-20 LAB — PULMONARY FUNCTION TEST
DL/VA % pred: 76 %
DL/VA: 3.12 ml/min/mmHg/L
DLCO unc % pred: 45 %
DLCO unc: 9.1 ml/min/mmHg
FEF 25-75 Post: 0.23 L/sec
FEF 25-75 Pre: 0.27 L/sec
FEF2575-%Change-Post: -13 %
FEF2575-%Pred-Post: 12 %
FEF2575-%Pred-Pre: 14 %
FEV1-%Change-Post: -2 %
FEV1-%Pred-Post: 28 %
FEV1-%Pred-Pre: 29 %
FEV1-Post: 0.65 L
FEV1-Pre: 0.67 L
FEV1FVC-%Change-Post: 9 %
FEV1FVC-%Pred-Pre: 56 %
FEV6-%Change-Post: -7 %
FEV6-%Pred-Post: 47 %
FEV6-%Pred-Pre: 50 %
FEV6-Post: 1.37 L
FEV6-Pre: 1.47 L
FEV6FVC-%Change-Post: 4 %
FEV6FVC-%Pred-Post: 102 %
FEV6FVC-%Pred-Pre: 98 %
FVC-%Change-Post: -11 %
FVC-%Pred-Post: 45 %
FVC-%Pred-Pre: 51 %
FVC-Post: 1.4 L
FVC-Pre: 1.58 L
Post FEV1/FVC ratio: 46 %
Post FEV6/FVC ratio: 98 %
Pre FEV1/FVC ratio: 42 %
Pre FEV6/FVC Ratio: 94 %
RV % pred: 179 %
RV: 4.08 L
TLC % pred: 111 %
TLC: 5.79 L

## 2019-08-20 MED ORDER — ALBUTEROL SULFATE (2.5 MG/3ML) 0.083% IN NEBU
2.5000 mg | INHALATION_SOLUTION | Freq: Once | RESPIRATORY_TRACT | Status: AC
  Administered 2019-08-20: 2.5 mg via RESPIRATORY_TRACT

## 2019-08-29 ENCOUNTER — Inpatient Hospital Stay (HOSPITAL_COMMUNITY): Payer: Medicare Other | Attending: Hematology

## 2019-08-29 ENCOUNTER — Other Ambulatory Visit: Payer: Self-pay

## 2019-08-29 ENCOUNTER — Ambulatory Visit (HOSPITAL_COMMUNITY)
Admission: RE | Admit: 2019-08-29 | Discharge: 2019-08-29 | Disposition: A | Discharge status: Home / Self Care | Payer: Medicare Other | Source: Ambulatory Visit | Attending: Nurse Practitioner | Admitting: Nurse Practitioner

## 2019-08-29 DIAGNOSIS — F17210 Nicotine dependence, cigarettes, uncomplicated: Secondary | ICD-10-CM | POA: Insufficient documentation

## 2019-08-29 DIAGNOSIS — Z87891 Personal history of nicotine dependence: Secondary | ICD-10-CM | POA: Diagnosis not present

## 2019-08-29 DIAGNOSIS — M818 Other osteoporosis without current pathological fracture: Secondary | ICD-10-CM | POA: Insufficient documentation

## 2019-08-29 DIAGNOSIS — Z170 Estrogen receptor positive status [ER+]: Secondary | ICD-10-CM | POA: Diagnosis not present

## 2019-08-29 DIAGNOSIS — C50412 Malignant neoplasm of upper-outer quadrant of left female breast: Secondary | ICD-10-CM | POA: Diagnosis not present

## 2019-08-29 DIAGNOSIS — J449 Chronic obstructive pulmonary disease, unspecified: Secondary | ICD-10-CM | POA: Diagnosis not present

## 2019-08-29 LAB — CBC WITH DIFFERENTIAL/PLATELET
Abs Immature Granulocytes: 0.01 10*3/uL (ref 0.00–0.07)
Basophils Absolute: 0 10*3/uL (ref 0.0–0.1)
Basophils Relative: 1 %
Eosinophils Absolute: 0.2 10*3/uL (ref 0.0–0.5)
Eosinophils Relative: 3 %
HCT: 42.2 % (ref 36.0–46.0)
Hemoglobin: 12.9 g/dL (ref 12.0–15.0)
Immature Granulocytes: 0 %
Lymphocytes Relative: 38 %
Lymphs Abs: 1.7 10*3/uL (ref 0.7–4.0)
MCH: 30.9 pg (ref 26.0–34.0)
MCHC: 30.6 g/dL (ref 30.0–36.0)
MCV: 101 fL — ABNORMAL HIGH (ref 80.0–100.0)
Monocytes Absolute: 0.4 10*3/uL (ref 0.1–1.0)
Monocytes Relative: 8 %
Neutro Abs: 2.3 10*3/uL (ref 1.7–7.7)
Neutrophils Relative %: 50 %
Platelets: 162 10*3/uL (ref 150–400)
RBC: 4.18 MIL/uL (ref 3.87–5.11)
RDW: 14.4 % (ref 11.5–15.5)
WBC: 4.6 10*3/uL (ref 4.0–10.5)
nRBC: 0 % (ref 0.0–0.2)

## 2019-08-29 LAB — COMPREHENSIVE METABOLIC PANEL
ALT: 13 U/L (ref 0–44)
AST: 16 U/L (ref 15–41)
Albumin: 4.1 g/dL (ref 3.5–5.0)
Alkaline Phosphatase: 85 U/L (ref 38–126)
Anion gap: 9 (ref 5–15)
BUN: 14 mg/dL (ref 8–23)
CO2: 30 mmol/L (ref 22–32)
Calcium: 9 mg/dL (ref 8.9–10.3)
Chloride: 98 mmol/L (ref 98–111)
Creatinine, Ser: 0.63 mg/dL (ref 0.44–1.00)
GFR calc Af Amer: >60 mL/min (ref >60)
GFR calc non Af Amer: >60 mL/min (ref >60)
Glucose, Bld: 111 mg/dL — ABNORMAL HIGH (ref 70–99)
Potassium: 3.8 mmol/L (ref 3.5–5.1)
Sodium: 137 mmol/L (ref 135–145)
Total Bilirubin: 0.4 mg/dL (ref 0.3–1.2)
Total Protein: 7.1 g/dL (ref 6.5–8.1)

## 2019-08-29 LAB — VITAMIN D 25 HYDROXY (VIT D DEFICIENCY, FRACTURES): Vit D, 25-Hydroxy: 23.21 ng/mL — ABNORMAL LOW (ref 30–100)

## 2019-08-29 LAB — FOLATE: Folate: 9.2 ng/mL (ref >5.9)

## 2019-08-29 LAB — VITAMIN B12: Vitamin B-12: 215 pg/mL (ref 180–914)

## 2019-08-29 LAB — TSH: TSH: 0.616 u[IU]/mL (ref 0.350–4.500)

## 2019-08-29 LAB — LACTATE DEHYDROGENASE: LDH: 160 U/L (ref 98–192)

## 2019-09-06 ENCOUNTER — Inpatient Hospital Stay (HOSPITAL_COMMUNITY): Payer: Medicare Other | Admitting: Nurse Practitioner

## 2019-09-06 ENCOUNTER — Other Ambulatory Visit: Payer: Self-pay

## 2019-09-06 ENCOUNTER — Inpatient Hospital Stay (HOSPITAL_COMMUNITY): Payer: Medicare Other

## 2019-09-06 VITALS — BP 126/84 | HR 80 | Temp 97.3°F | Resp 18 | Wt 166.6 lb

## 2019-09-06 DIAGNOSIS — J449 Chronic obstructive pulmonary disease, unspecified: Secondary | ICD-10-CM | POA: Diagnosis not present

## 2019-09-06 DIAGNOSIS — C50412 Malignant neoplasm of upper-outer quadrant of left female breast: Secondary | ICD-10-CM | POA: Diagnosis not present

## 2019-09-06 DIAGNOSIS — R911 Solitary pulmonary nodule: Secondary | ICD-10-CM | POA: Diagnosis not present

## 2019-09-06 DIAGNOSIS — Z170 Estrogen receptor positive status [ER+]: Secondary | ICD-10-CM | POA: Diagnosis not present

## 2019-09-06 DIAGNOSIS — M818 Other osteoporosis without current pathological fracture: Secondary | ICD-10-CM | POA: Diagnosis not present

## 2019-09-06 NOTE — Progress Notes (Signed)
Joanne Kramer, Joanne Kramer [id number]   CLINIC:  Medical Oncology/Hematology  PCP:  Joanne Kramer, [place] [address] [phone]   REASON FOR VISIT: Follow-up for breast cancer   CURRENT THERAPY: Surveillance   INTERVAL HISTORY:  Joanne Kramer 74 y.o. female returns for routine follow-up for breast cancer.  Patient reports she is feeling well since her last visit.  She reports she does not want the Prolia injections anymore due to muscle weakness.  She denies any new lumps or bumps present.  She denies any new bone pain. Denies any nausea, vomiting, or diarrhea. Denies any new pains. Had not noticed any recent bleeding such as epistaxis, hematuria or hematochezia. Denies recent chest pain on exertion, shortness of breath on minimal exertion, pre-syncopal episodes, or palpitations. Denies any numbness or tingling in hands or feet. Denies any recent fevers, infections, or recent hospitalizations. Patient reports appetite at 100% and energy level at 25%.  She is eating well maintain her weight at this time.    REVIEW OF SYSTEMS:  Review of Systems  Respiratory: Positive for cough and shortness of breath.   Neurological: Positive for headaches and numbness.  Psychiatric/Behavioral: Positive for sleep disturbance.  All other systems reviewed and are negative.    PAST MEDICAL/SURGICAL HISTORY:  Past Medical History:  Diagnosis Date  . Breast cancer ([place])   . Contact dermatitis    chronic itching but skin biopsy did not show anything specific  . COPD (chronic obstructive pulmonary disease) ([place] [place])   . Emphysema of lung ([place])   . Headache   . 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 OA (osteoarthritis) of knee    right  . Pneumonia   . Requires supplemental oxygen    Past Surgical History:  Procedure Laterality Date  . BREAST LUMPECTOMY WITH RADIOACTIVE SEED AND SENTINEL LYMPH NODE BIOPSY Left 03/05/2018   Procedure:  LEFT BREAST LUMPECTOMY WITH RADIOACTIVE SEED AND LEFT AXILLARY SENTINEL LYMPH NODE BIOPSY;  Surgeon: Joanne Bookbinder, MD;  Location: [facility];  Service: General;  Laterality: Left;  . BREAST SURGERY     left breast aspiration  . CESAREAN SECTION    . COLONOSCOPY    . EYE SURGERY    . KNEE SURGERY Right   . TONSILLECTOMY    . TOTAL KNEE ARTHROPLASTY Right 06/27/2017   Procedure: RIGHT TOTAL KNEE ARTHROPLASTY;  Surgeon: Joanne Balding, MD;  Location: [facility];  Service: Orthopedics;  Laterality: Right;     SOCIAL HISTORY:  Social History   Socioeconomic History  . Marital status: Soil scientist    Spouse name: Not on file  . Number of children: 2  . Years of education: 16  . Highest education level: Bachelor's degree (e.g., BA, AB, BS)  Occupational History  . Occupation: Retired    Comment: Careers information officer  Tobacco Use  . Smoking status: Current Every Day Smoker    Packs/day: 1.00    Years: 55.00    Pack years: 55.00    Types: Cigarettes  . Smokeless tobacco: Never Used  Vaping Use  . Vaping Use: Never used  Substance and Sexual Activity  . Alcohol use: No  . Drug use: No  . Sexual activity: Not Currently  Other Topics Concern  . Not on file  Social History Narrative  . Not on file   Social Determinants of Health   Financial Resource Strain:   . Difficulty of Paying Living Expenses:  Food Insecurity:   . Worried About Charity fundraiser in the Last Year:   . Arboriculturist in the Last Year:   Transportation Needs:   . Film/video editor (Medical):   Marland Kitchen Lack of Transportation (Non-Medical):   Physical Activity: Inactive  . Days of Exercise per Week: 0 days  . Minutes of Exercise per Session: 0 min  Stress:   . Feeling of Stress :   Social Connections:   . Frequency of Communication with Friends and Family:   . Frequency of Social Gatherings with Friends and Family:   . Attends Religious Services:   . Active Member of Clubs or Organizations:   .  Attends Archivist Meetings:   Marland Kitchen Marital Status:   Intimate Partner Violence:   . Fear of Current or Ex-Partner:   . Emotionally Abused:   Marland Kitchen Physically Abused:   . Sexually Abused:     FAMILY HISTORY:  Family History  Adopted: Yes    CURRENT MEDICATIONS:  Outpatient Encounter Medications as of 09/06/2019  Medication Sig  . Budeson-Glycopyrrol-Formoterol (BREZTRI AEROSPHERE) 160-9-4.8 MCG/ACT AERO Inhale 2 puffs into the lungs 2 (two) times daily.  . Calcium Carb-Cholecalciferol (CALCIUM 600+D3 PO) Take 1 tablet by mouth daily.  . calcium carbonate (TUMS EX) 750 MG chewable tablet Chew 1-2 tablets by mouth 3 (three) times daily as needed (for heartburn/indigestion.).   Marland Kitchen clotrimazole (MYCELEX) 10 MG troche clotrimazole 10 mg troche  TAKE 1 TABLET (10 MG TOTAL) BY MOUTH 5 (FIVE) TIMES DAILY.  Marland Kitchen guaiFENesin (MUCINEX) 600 MG 12 hr tablet Take 2 tablets (1,200 mg total) by mouth 2 (two) times daily as needed for cough or to loosen phlegm.  . Ipratropium-Albuterol (COMBIVENT RESPIMAT) 20-100 MCG/ACT AERS respimat INHALE 1 PUFF INTO THE LUNGS EVERY 4 (FOUR) HOURS AS NEEDED ((TYPICALLY 4X'S DAILY)).  . Melatonin 5 MG TABS Take 5 mg by mouth at bedtime as needed (for sleep.).   Marland Kitchen Multiple Vitamin (MULTIVITAMIN WITH MINERALS) TABS tablet Take 1 tablet by mouth daily.  . naproxen sodium (ALEVE) 220 MG tablet Take 440 mg by mouth daily as needed (pain).  . OXYGEN Inhale 2 L into the lungs at bedtime.  . traMADol (ULTRAM) 50 MG tablet Take 0.5 tablets (25 mg total) by mouth every 8 (eight) hours as needed.  . triamcinolone cream (KENALOG) 0.1 % Apply 1 application topically 2 (two) times daily as needed (for skin irritation/contact dermatitis).  . varenicline (CHANTIX PAK) 0.5 MG X 11 & 1 MG X 42 tablet Take one 0.5 mg tablet by mouth once daily for 3 days, then increase to one 0.5 mg tablet twice daily for 4 days, then increase to one 1 mg tablet twice daily.  . [DISCONTINUED]  Budeson-Glycopyrrol-Formoterol (BREZTRI AEROSPHERE) 160-9-4.8 MCG/ACT AERO Inhale 2 puffs into the lungs 2 (two) times daily.   No facility-administered encounter medications on file as of 09/06/2019.    ALLERGIES:  Allergies  Allergen Reactions  . Prolia [Denosumab] Other (See Comments)    Joint pain  . Anastrozole   . Letrozole   . Tamoxifen      PHYSICAL EXAM:  ECOG Performance status: 1  Vitals:   09/06/19 1148  BP: 126/84  Pulse: 80  Resp: 18  Temp: (!) 97.3 F (36.3 C)  SpO2: 96%   Filed Weights   09/06/19 1148  Weight: 166 lb 9.6 oz (75.6 kg)   Physical Exam Constitutional:      Appearance: Normal appearance. She is normal  weight.  Cardiovascular:     Rate and Rhythm: Normal rate and regular rhythm.     Heart sounds: Normal heart sounds.  Pulmonary:     Effort: Pulmonary effort is normal.     Breath sounds: Normal breath sounds.  Abdominal:     General: Bowel sounds are normal.     Palpations: Abdomen is soft.  Musculoskeletal:        General: Normal range of motion.  Skin:    General: Skin is warm.  Neurological:     Mental Status: She is alert and oriented to person, place, and time. Mental status is at baseline.  Psychiatric:        Mood and Affect: Mood normal.        Behavior: Behavior normal.        Thought Content: Thought content normal.        Judgment: Judgment normal.      LABORATORY DATA:  I have reviewed the labs as listed.  CBC    Component Value Date/Time   WBC 4.6 08/29/2019 1308   RBC 4.18 08/29/2019 1308   HGB 12.9 08/29/2019 1308   HGB 12.9 05/22/2017 1603   HCT 42.2 08/29/2019 1308   HCT 41.6 05/22/2017 1603   PLT 162 08/29/2019 1308   PLT 207 05/22/2017 1603   MCV 101.0 (H) 08/29/2019 1308   MCV 96 05/22/2017 1603   MCH 30.9 08/29/2019 1308   MCHC 30.6 08/29/2019 1308   RDW 14.4 08/29/2019 1308   RDW 14.8 05/22/2017 1603   LYMPHSABS 1.7 08/29/2019 1308   LYMPHSABS 2.8 05/22/2017 1603   MONOABS 0.4 08/29/2019  1308   EOSABS 0.2 08/29/2019 1308   EOSABS 0.2 05/22/2017 1603   BASOSABS 0.0 08/29/2019 1308   BASOSABS 0.1 05/22/2017 1603   CMP Latest Ref Rng & Units 08/29/2019 01/31/2019 08/09/2018  Glucose 70 - 99 mg/dL 111(H) 94 88  BUN 8 - 23 mg/dL _0 Creatinine 0.44 - 1.00 mg/dL 0.63 0.70 0.58  Sodium 135 - 145 mmol/L 137 139 141  Potassium 3.5 - 5.1 mmol/L 3.8 4.3 4.4  Chloride 98 - 111 mmol/L 98 99 101  CO2 22 - 32 mmol/L 30 28 32  Calcium 8.9 - 10.3 mg/dL 9.0 9.6 9.3  Total Protein 6.5 - 8.1 g/dL 7.1 6.8 7.3  Total Bilirubin 0.3 - 1.2 mg/dL 0.4 0.5 0.6  Alkaline Phos 38 - 126 U/L 85 50 83  AST 15 - 41 U/L 16 19 14(L)  ALT 0 - 44 U/L _1 DIAGNOSTIC IMAGING:  I have independently reviewed the CT scans and discussed with the patient.  ASSESSMENT & PLAN:  Malignant neoplasm of upper-outer quadrant of left breast in female, estrogen receptor positive ([place]) 1.  Stage Ia left breast cancer: -Mammogram on 12/26/2017 showed suspicious 9 mm mass in the upper outer quadrant of the left breast.  Solitary borderline left axillary lymph node with cortical thickness of 4 mm. -Biopsy on 01/02/2018 of the left breast 2 o'clock position consistent with invasive ductal carcinoma, grade 1-2.  Left axillary lymph node was negative for carcinoma. -Left breast lumpectomy and sentinel lymph node biopsy on 03/05/2018 showed 1.2 cm IDC, margins negative, grade 1, 0/2 lymph nodes involved.  ER/PR positive, HER-2 negative, Ki-67 2%. -Anastrozole was started in February 2020.  However she had developed joint pains.  It was switched to letrozole on 05/22/2018. -She continues with significant joint pain.  She was then switched to tamoxifen  in November 2020.  She continues to report significant joint pain that is affecting her everyday living. -She is stopped taking tamoxifen.  Risk versus benefits were discussed with the patient. -Last mammogram done on 01/01/2019 was BI-RADS Category 2 benign. -Labs done on  08/29/2019 were all WNL. -She will return to the clinic in 6 months with repeat labs.  2.  Bone density: -DEXA scan on 08/01/2018 showed a T score of -2.9 osteoporosis. -Vitamin D level on 08/29/2019 was 23.21 -Prolia started on 08/09/2018. -Patient reports she feels apparently injections are causing her muscle aches and joint pains to worsen. -Patient does not want to continue the Prolia injections at this time. -She will continue taking calcium and vitamin D daily  3.  Smoking history: -Patient started smoking at age 5.  At her peak she smoked 2 packs/day for 5 years.  She now smokes a half a pack per day. -Low-dose CT scan done on 08/29/2019 showed lung RADS category three -We will repeat in 6 months.     Orders placed this encounter:  Orders Placed This Encounter  Procedures  . CT CHEST NODULE FOLLOW UP LOW DOSE WO CM  . Lactate dehydrogenase  . CBC with Differential/Platelet  . Comprehensive metabolic panel  . Vitamin B12  . VITAMIN D 25 Hydroxy (Vit-D Deficiency, Fractures)  . Folate      Francene Finders, FNP-C [facility] [phone]

## 2019-09-06 NOTE — Assessment & Plan Note (Addendum)
1.  Stage Ia left breast cancer: -Mammogram on 12/26/2017 showed suspicious 9 mm mass in the upper outer quadrant of the left breast.  Solitary borderline left axillary lymph node with cortical thickness of 4 mm. -Biopsy on 01/02/2018 of the left breast 2 o'clock position consistent with invasive ductal carcinoma, grade 1-2.  Left axillary lymph node was negative for carcinoma. -Left breast lumpectomy and sentinel lymph node biopsy on 03/05/2018 showed 1.2 cm IDC, margins negative, grade 1, 0/2 lymph nodes involved.  ER/PR positive, HER-2 negative, Ki-67 2%. -Anastrozole was started in February 2020.  However she had developed joint pains.  It was switched to letrozole on 05/22/2018. -She continues with significant joint pain.  She was then switched to tamoxifen in November 2020.  She continues to report significant joint pain that is affecting her everyday living. -She is stopped taking tamoxifen.  Risk versus benefits were discussed with the patient. -Last mammogram done on 01/01/2019 was BI-RADS Category 2 benign. -Labs done on 08/29/2019 were all WNL. -She will return to the clinic in 6 months with repeat labs.  2.  Bone density: -DEXA scan on 08/01/2018 showed a T score of -2.9 osteoporosis. -Vitamin D level on 08/29/2019 was 23.21 -Prolia started on 08/09/2018. -Patient reports she feels apparently injections are causing her muscle aches and joint pains to worsen. -Patient does not want to continue the Prolia injections at this time. -She will continue taking calcium and vitamin D daily  3.  Smoking history: -Patient started smoking at age 31.  At her peak she smoked 2 packs/day for 5 years.  She now smokes a half a pack per day. -Low-dose CT scan done on 08/29/2019 showed lung RADS category three -We will repeat in 6 months.

## 2019-09-09 ENCOUNTER — Encounter (HOSPITAL_COMMUNITY): Payer: Self-pay | Admitting: *Deleted

## 2019-09-09 NOTE — Progress Notes (Signed)
Joanne Finders, NP reviewed LDCT results with patient on 7/9.  Patient has already been scheduled for 6 month follow up LDCT nodule follow up on 03/09/20.

## 2019-09-10 ENCOUNTER — Telehealth: Payer: Self-pay | Admitting: *Deleted

## 2019-09-10 ENCOUNTER — Other Ambulatory Visit: Payer: Self-pay | Admitting: Family Medicine

## 2019-09-10 DIAGNOSIS — E7800 Pure hypercholesterolemia, unspecified: Secondary | ICD-10-CM

## 2019-09-10 DIAGNOSIS — J449 Chronic obstructive pulmonary disease, unspecified: Secondary | ICD-10-CM | POA: Diagnosis not present

## 2019-09-10 MED ORDER — SIMVASTATIN 20 MG PO TABS: ORAL_TABLET | ORAL | 1 refills | Status: DC

## 2019-09-10 NOTE — Telephone Encounter (Signed)
Fax from [facility] request for Simvastatin 20 mg 1 QHS Not on current med list Please advise

## 2019-09-10 NOTE — Telephone Encounter (Signed)
Yes, scrip sen to [facility] [place]

## 2019-09-11 ENCOUNTER — Other Ambulatory Visit (HOSPITAL_COMMUNITY): Payer: Self-pay | Admitting: Nurse Practitioner

## 2019-09-11 DIAGNOSIS — J449 Chronic obstructive pulmonary disease, unspecified: Secondary | ICD-10-CM

## 2019-09-12 ENCOUNTER — Ambulatory Visit (HOSPITAL_COMMUNITY): Payer: Medicare Other

## 2019-09-12 ENCOUNTER — Ambulatory Visit (HOSPITAL_COMMUNITY): Payer: Medicare Other | Admitting: Nurse Practitioner

## 2019-09-16 ENCOUNTER — Other Ambulatory Visit (HOSPITAL_COMMUNITY): Payer: Self-pay | Admitting: Nurse Practitioner

## 2019-09-16 DIAGNOSIS — J449 Chronic obstructive pulmonary disease, unspecified: Secondary | ICD-10-CM

## 2019-09-19 DIAGNOSIS — G5601 Carpal tunnel syndrome, right upper limb: Secondary | ICD-10-CM | POA: Diagnosis not present

## 2019-09-19 DIAGNOSIS — G5602 Carpal tunnel syndrome, left upper limb: Secondary | ICD-10-CM | POA: Diagnosis not present

## 2019-10-01 DIAGNOSIS — G629 Polyneuropathy, unspecified: Secondary | ICD-10-CM | POA: Diagnosis not present

## 2019-10-01 DIAGNOSIS — M79643 Pain in unspecified hand: Secondary | ICD-10-CM | POA: Diagnosis not present

## 2019-10-01 DIAGNOSIS — M1990 Unspecified osteoarthritis, unspecified site: Secondary | ICD-10-CM | POA: Diagnosis not present

## 2019-10-01 DIAGNOSIS — R202 Paresthesia of skin: Secondary | ICD-10-CM | POA: Diagnosis not present

## 2019-10-01 DIAGNOSIS — G5603 Carpal tunnel syndrome, bilateral upper limbs: Secondary | ICD-10-CM | POA: Diagnosis not present

## 2019-10-09 ENCOUNTER — Telehealth: Payer: Self-pay | Admitting: Pulmonary Disease

## 2019-10-09 ENCOUNTER — Other Ambulatory Visit: Payer: Self-pay

## 2019-10-09 MED ORDER — BREZTRI AEROSPHERE 160-9-4.8 MCG/ACT IN AERO: 2.0000 | INHALATION_SPRAY | Freq: Two times a day (BID) | RESPIRATORY_TRACT | 0 refills | Status: DC

## 2019-10-09 MED ORDER — BREZTRI AEROSPHERE 160-9-4.8 MCG/ACT IN AERO
2.0000 | INHALATION_SPRAY | Freq: Two times a day (BID) | RESPIRATORY_TRACT | 0 refills | Status: DC
Start: 2019-10-09 — End: 2019-10-17

## 2019-10-09 MED ORDER — BREZTRI AEROSPHERE 160-9-4.8 MCG/ACT IN AERO: 2.0000 | INHALATION_SPRAY | Freq: Two times a day (BID) | RESPIRATORY_TRACT | 5 refills | Status: DC

## 2019-10-09 NOTE — Telephone Encounter (Signed)
Sample of Breztri given to patient per telephone convo with CMA earlier.  Lot# [id number] Expires:01/28/2021

## 2019-10-09 NOTE — Telephone Encounter (Signed)
Sample of breztri placed up front for pt. Called and spoke with pt letting her know this had been done and she verbalized understanding. nothing further needed.

## 2019-10-11 DIAGNOSIS — J449 Chronic obstructive pulmonary disease, unspecified: Secondary | ICD-10-CM | POA: Diagnosis not present

## 2019-10-17 ENCOUNTER — Ambulatory Visit (INDEPENDENT_AMBULATORY_CARE_PROVIDER_SITE_OTHER): Payer: Medicare Other | Admitting: Pulmonary Disease

## 2019-10-17 ENCOUNTER — Other Ambulatory Visit: Payer: Self-pay

## 2019-10-17 ENCOUNTER — Encounter: Payer: Self-pay | Admitting: Pulmonary Disease

## 2019-10-17 VITALS — BP 140/80 | HR 95 | Temp 97.1°F | Ht 64.0 in | Wt 165.0 lb

## 2019-10-17 DIAGNOSIS — J439 Emphysema, unspecified: Secondary | ICD-10-CM

## 2019-10-17 DIAGNOSIS — J9611 Chronic respiratory failure with hypoxia: Secondary | ICD-10-CM

## 2019-10-17 DIAGNOSIS — J449 Chronic obstructive pulmonary disease, unspecified: Secondary | ICD-10-CM | POA: Diagnosis not present

## 2019-10-17 DIAGNOSIS — Z720 Tobacco use: Secondary | ICD-10-CM

## 2019-10-17 NOTE — Progress Notes (Signed)
[facility]  Chief Complaint  Patient presents with  . Follow-up    Patient was using Chantix but states that the second week of using it she was very nauseous and stopped taking it. Doing good with the Breztri inhaler. Breathing is much better since starting Breztri. Wears 2 liters oxygen with exertion and at night. Using combivent twice a day instead of 4 times a day    Constitutional:  BP 140/80 (BP Location: Left Arm, Patient Position: Sitting, Cuff Size: Large)   Pulse 95   Temp (!) 97.1 F (36.2 C) (Temporal)   Ht 5\' 4"  (1.626 m)   Wt 165 lb (74.8 kg)   SpO2 91%   BMI 28.32 kg/m   Past Medical History:  Lt breast cancer s/p lumpectomy January 2020, Osteoporosis, HLD, Pneumonia  Summary:  Joanne Kramer is a 74 y.o. female smoker with COPD and chronic respiratory failure.  Subjective:   She did well on first week of chantix, but then get terrible nausea and had to stop.  She is down to 1/2 ppd and plans to gradually quit.  [name] has worked better.  Not needing combivent as much.  Doesn't have as much cough, wheeze, or sputum.    Uses 2 liters oxygen with exertion and sleep.  PFT showed severe obstruction, air trapping, and moderate diffusion defect.  She had LDCT.  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  Also has nodule in RLL.  Physical Exam:   Appearance - well kempt   ENMT - no sinus tenderness, no oral exudate, no LAN, Mallampati 2 airway, no stridor  Respiratory - equal breath sounds bilaterally, no wheezing or rales  CV - s1s2 regular rate and rhythm, no murmurs  Ext - no clubbing, no edema  Skin - no rashes  Psych - normal mood and affect   Assessment/Plan:   COPD with chronic bronchitis and emphysema. - continue breztri - prn combivent  Chronic respiratory failure with hypoxia. - uses [employer] for DME - 2 liters oxygen with exertion and sleep  Tobacco abuse. - intolerant of chantix due  to nausea - she will continue to gradually quit  Lung nodules. 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  Follow up:  Patient Instructions  Will have you sign a release form to get medical records from [place]  Follow up in 6 months   Signature:  Chesley Mires, MD [place] Pager: [phone] 10/17/2019, 12:48 PM  Flow Sheet     Pulmonary tests:   PFT 08/20/19 >> FEV1 0.67 (29%), FEV1% 42, TLC 5.79 (111%), RV 4.08 (179%), DLCO 45%  Chest imaging:   LDCT 08/29/19 >> coronary calcification, 4 cm ascending aorta, centrilobular emphysema, innumerable tiny upper lung GGO, calcified granuloma RUL, 6.6 mm nodule RLL   Medications:   Allergies as of 10/17/2019      Reactions   Prolia [denosumab] Other (See Comments)   Joint pain   Anastrozole    Letrozole    Tamoxifen       Medication List       Accurate as of October 17, 2019 12:48 PM. If you have any questions, ask your nurse or doctor.  STOP taking these medications   simvastatin 20 MG tablet Commonly known as: ZOCOR Stopped by: Chesley Mires, MD   varenicline 0.5 MG X 11 & 1 MG X 42 tablet Commonly known as: CHANTIX PAK Stopped by: Chesley Mires, MD     TAKE these medications   Breztri Aerosphere 160-9-4.8 MCG/ACT Aero Generic drug: Budeson-Glycopyrrol-Formoterol Inhale 2 puffs into the lungs 2 (two) times daily. What changed: Another medication with the same name was removed. Continue taking this medication, and follow the directions you see here. Changed by: Chesley Mires, MD   CALCIUM 600+D3 PO Take 1 tablet by mouth daily.   calcium carbonate 750 MG chewable tablet Commonly known as: TUMS EX Chew 1-2 tablets by mouth 3 (three) times daily as needed  (for heartburn/indigestion.).   clotrimazole 10 MG troche Commonly known as: MYCELEX clotrimazole 10 mg troche  TAKE 1 TABLET (10 MG TOTAL) BY MOUTH 5 (FIVE) TIMES DAILY.   Combivent Respimat 20-100 MCG/ACT Aers respimat Generic drug: Ipratropium-Albuterol INHALE 1 PUFF INTO THE LUNGS EVERY 4 (FOUR) HOURS AS NEEDED ((TYPICALLY 4X'S DAILY)).   melatonin 5 MG Tabs Take 5 mg by mouth at bedtime as needed (for sleep.).   Mucinex 600 MG 12 hr tablet Generic drug: guaiFENesin Take 2 tablets (1,200 mg total) by mouth 2 (two) times daily as needed for cough or to loosen phlegm.   multivitamin with minerals Tabs tablet Take 1 tablet by mouth daily.   naproxen sodium 220 MG tablet Commonly known as: ALEVE Take 440 mg by mouth daily as needed (pain).   OXYGEN Inhale 2 L into the lungs at bedtime.   traMADol 50 MG tablet Commonly known as: ULTRAM Take 0.5 tablets (25 mg total) by mouth every 8 (eight) hours as needed.   triamcinolone cream 0.1 % Commonly known as: KENALOG Apply 1 application topically 2 (two) times daily as needed (for skin irritation/contact dermatitis).       Past Surgical History:  She  has a past surgical history that includes Eye surgery; Cesarean section; Knee surgery (Right); Tonsillectomy; Breast surgery; Colonoscopy; Total knee arthroplasty (Right, 06/27/2017); and Breast lumpectomy with radioactive seed and sentinel lymph node biopsy (Left, 03/05/2018).  Family History:  Her family history is not on file. She was adopted.  Social History:  She  reports that she has been smoking cigarettes. She has a 13.75 pack-year smoking history. She has never used smokeless tobacco. She reports that she does not drink alcohol and does not use drugs.

## 2019-10-17 NOTE — Patient Instructions (Signed)
Will have you sign a release form to get medical records from [place]  Follow up in 6 months

## 2019-10-25 DIAGNOSIS — G5601 Carpal tunnel syndrome, right upper limb: Secondary | ICD-10-CM | POA: Diagnosis not present

## 2019-11-08 ENCOUNTER — Other Ambulatory Visit (HOSPITAL_COMMUNITY): Payer: Self-pay | Admitting: Nurse Practitioner

## 2019-11-08 DIAGNOSIS — J449 Chronic obstructive pulmonary disease, unspecified: Secondary | ICD-10-CM

## 2019-11-11 DIAGNOSIS — J449 Chronic obstructive pulmonary disease, unspecified: Secondary | ICD-10-CM | POA: Diagnosis not present

## 2019-12-11 DIAGNOSIS — J449 Chronic obstructive pulmonary disease, unspecified: Secondary | ICD-10-CM | POA: Diagnosis not present

## 2019-12-29 DIAGNOSIS — J449 Chronic obstructive pulmonary disease, unspecified: Secondary | ICD-10-CM | POA: Diagnosis not present

## 2020-01-01 ENCOUNTER — Encounter: Payer: Self-pay | Admitting: *Deleted

## 2020-01-02 ENCOUNTER — Other Ambulatory Visit (HOSPITAL_COMMUNITY): Payer: Self-pay | Admitting: Hematology

## 2020-01-02 DIAGNOSIS — C50412 Malignant neoplasm of upper-outer quadrant of left female breast: Secondary | ICD-10-CM

## 2020-01-02 DIAGNOSIS — Z170 Estrogen receptor positive status [ER+]: Secondary | ICD-10-CM

## 2020-01-06 ENCOUNTER — Other Ambulatory Visit: Payer: Self-pay

## 2020-01-06 ENCOUNTER — Ambulatory Visit (INDEPENDENT_AMBULATORY_CARE_PROVIDER_SITE_OTHER): Payer: Medicare Other | Admitting: Family Medicine

## 2020-01-06 ENCOUNTER — Encounter: Payer: Self-pay | Admitting: Family Medicine

## 2020-01-06 VITALS — BP 121/80 | HR 83 | Temp 97.5°F | Resp 20 | Ht 64.0 in | Wt 172.8 lb

## 2020-01-06 DIAGNOSIS — R1312 Dysphagia, oropharyngeal phase: Secondary | ICD-10-CM | POA: Diagnosis not present

## 2020-01-06 DIAGNOSIS — R002 Palpitations: Secondary | ICD-10-CM

## 2020-01-06 DIAGNOSIS — E7800 Pure hypercholesterolemia, unspecified: Secondary | ICD-10-CM | POA: Diagnosis not present

## 2020-01-06 DIAGNOSIS — J449 Chronic obstructive pulmonary disease, unspecified: Secondary | ICD-10-CM

## 2020-01-06 DIAGNOSIS — Z23 Encounter for immunization: Secondary | ICD-10-CM | POA: Diagnosis not present

## 2020-01-06 DIAGNOSIS — M25512 Pain in left shoulder: Secondary | ICD-10-CM

## 2020-01-06 DIAGNOSIS — M25511 Pain in right shoulder: Secondary | ICD-10-CM

## 2020-01-06 NOTE — Progress Notes (Signed)
Subjective:  Patient ID: Joanne Kramer, female    DOB: [birth year]  Age: 74 y.o. MRN: [id number]  CC: Follow-up (6 month)   HPI Joanne Kramer presents for follow-up on her COPD and her cholesterol.  Ever since she discontinued the breast cancer chemo she has been having joint pains.  Those seem to be in them middle of the upper arms and into the shoulders bilaterally.  She continues to do well with the breast 3 and ipratropium.  She has been able to back off on the Combivent to twice a day.  The breast 3 seems to clogged on her from time to time.  She also reports that she had a single episode of palpitations with a rate to about 150 lasting about 10 minutes several nights ago.  These happen rarely but off and on for 12 years.  She is not sure no other association temporally with the inhalers.  She is also having trouble swallowing naproxen.  She thinks she got a piece of plastic in her throat from a soda that she was drinking with a straw.  This occurred several days ago and she feels like there is a piece of plastic still in the back of her throat.  She is able to handle most foods without choking.  Her primary dysphagia is with pills.  It also seems to be just on the right side of her throat.  She had a thyroid screen with TSH done 5 months ago.  This was normal.  Previous episodes of palpitations predate that lab test.  Depression screen [facility] 2/9 01/06/2020 07/04/2019 12/27/2018  Decreased Interest 0 0 0  Down, Depressed, Hopeless 0 0 0  PHQ - 2 Score 0 0 0  Altered sleeping - - -  Tired, decreased energy - - -  Change in appetite - - -  Feeling bad or failure about yourself  - - -  Trouble concentrating - - -  Moving slowly or fidgety/restless - - -  Suicidal thoughts - - -  PHQ-9 Score - - -    History Joanne Kramer has a past medical history of Breast cancer ([place]), Contact dermatitis, COPD (chronic obstructive pulmonary disease) ([place]), Emphysema of lung ([place]), Headache, 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   She has a past surgical history that includes Eye surgery; Cesarean section; Knee surgery (Right); Tonsillectomy; Breast surgery; Colonoscopy; Total knee arthroplasty (Right, 06/27/2017); and Breast lumpectomy with radioactive seed and sentinel lymph node biopsy (Left, 03/05/2018).   Her family history is not on file. She was adopted.She reports that she has been smoking cigarettes. She has a 13.75 pack-year smoking history. She has never used smokeless tobacco. She reports that she does not drink alcohol and does not use drugs.    ROS Review of Systems  Constitutional: Negative.   HENT: Positive for trouble swallowing (primarily pills).   Eyes: Negative for visual disturbance.  Respiratory: Negative for shortness of breath.   Cardiovascular: Negative for chest pain.  Gastrointestinal: Negative for abdominal pain.  Musculoskeletal: Negative for arthralgias.    Objective:  BP 121/80   Pulse 83   Temp (!) 97.5 F (36.4 C) (Temporal)   Resp 20   Ht '5\' 4"'  (1.626 m)   Wt 172 lb 12.8 oz (78.4 kg)   SpO2 91%   BMI 29.66 kg/m   BP Readings from Last 3 Encounters:  01/06/20 121/80  10/17/19 140/80  09/06/19 126/84  Wt Readings from Last 3 Encounters:  01/06/20 172 lb 12.8 oz (78.4 kg)  10/17/19 165 lb (74.8 kg)  09/06/19 166 lb 9.6 oz (75.6 kg)     Physical Exam Constitutional:      General: She is not in acute distress.    Appearance: She is well-developed.  HENT:     Head: Normocephalic and atraumatic.  Eyes:     Conjunctiva/sclera: Conjunctivae normal.     Pupils: Pupils are equal, round, and reactive to light.  Neck:     Thyroid: No thyromegaly.  Cardiovascular:     Rate and Rhythm: Normal rate and regular rhythm.     Heart sounds: Normal heart sounds. No murmur heard.   Pulmonary:     Effort: Pulmonary effort is normal. No respiratory distress.      Breath sounds: Normal breath sounds. No wheezing or rales.  Abdominal:     Palpations: Abdomen is soft.     Tenderness: There is no abdominal tenderness.  Musculoskeletal:        General: Normal range of motion.     Cervical back: Normal range of motion and neck supple.  Lymphadenopathy:     Cervical: No cervical adenopathy.  Skin:    General: Skin is warm and dry.  Neurological:     Mental Status: She is alert and oriented to person, place, and time.  Psychiatric:        Behavior: Behavior normal.        Thought Content: Thought content normal.        Judgment: Judgment normal.       Assessment & Plan:   Joanne Kramer was seen today for follow-up.  Diagnoses and all orders for this visit:  Chronic obstructive pulmonary disease, unspecified COPD type ([place]) -     CBC with Differential/Platelet -     CMP14+EGFR -     Lipid panel  Pure hypercholesterolemia -     CBC with Differential/Platelet -     CMP14+EGFR -     Lipid panel  Oropharyngeal dysphagia -     CBC with Differential/Platelet -     CMP14+EGFR -     Lipid panel -     Ambulatory referral to Gastroenterology  Intermittent palpitations  Bilateral shoulder pain, unspecified chronicity -     Ambulatory referral to Physical Therapy   Since her palpitations are rare and not associated with any other symptoms such as chest usual shortness of breath continue to observe them for now.  At some point get a Holter monitor if they keep up.  At this time she is going to try to watch for them being associated with her use of the inhalers.  Particularly sometimes she does not think she gets all of the breztri.  The palpitations may be associated with absorption of her beta agonists.    I have discontinued Demetrio Lapping traMADol. I am also having her maintain her melatonin, calcium carbonate, OXYGEN, multivitamin with minerals, Calcium Carb-Cholecalciferol (CALCIUM 600+D3 PO), triamcinolone cream, naproxen sodium,  clotrimazole, Mucinex, Breztri Aerosphere, and Combivent Respimat.  Allergies as of 01/06/2020      Reactions   Prolia [denosumab] Other (See Comments)   Joint pain   Anastrozole    Letrozole    Tamoxifen       Medication List       Accurate as of January 06, 2020  1:05 PM. If you have any questions, ask your nurse or doctor.        STOP  taking these medications   traMADol 50 MG tablet Commonly known as: ULTRAM Stopped by: Claretta Fraise, MD     TAKE these medications   Breztri Aerosphere 160-9-4.8 MCG/ACT Aero Generic drug: Budeson-Glycopyrrol-Formoterol Inhale 2 puffs into the lungs 2 (two) times daily.   CALCIUM 600+D3 PO Take 1 tablet by mouth daily.   calcium carbonate 750 MG chewable tablet Commonly known as: TUMS EX Chew 1-2 tablets by mouth 3 (three) times daily as needed (for heartburn/indigestion.).   clotrimazole 10 MG troche Commonly known as: MYCELEX clotrimazole 10 mg troche  TAKE 1 TABLET (10 MG TOTAL) BY MOUTH 5 (FIVE) TIMES DAILY.   Combivent Respimat 20-100 MCG/ACT Aers respimat Generic drug: Ipratropium-Albuterol INHALE 1 PUFF INTO THE LUNGS EVERY 4 (FOUR) HOURS AS NEEDED (USE 4 TIMES DAILY)   melatonin 5 MG Tabs Take 5 mg by mouth at bedtime as needed (for sleep.).   Mucinex 600 MG 12 hr tablet Generic drug: guaiFENesin Take 2 tablets (1,200 mg total) by mouth 2 (two) times daily as needed for cough or to loosen phlegm.   multivitamin with minerals Tabs tablet Take 1 tablet by mouth daily.   naproxen sodium 220 MG tablet Commonly known as: ALEVE Take 440 mg by mouth daily as needed (pain).   OXYGEN Inhale 2 L into the lungs at bedtime.   triamcinolone cream 0.1 % Commonly known as: KENALOG Apply 1 application topically 2 (two) times daily as needed (for skin irritation/contact dermatitis).        Follow-up: Return in about 6 months (around 07/05/2020), or if symptoms worsen or fail to improve.  Claretta Fraise, M.D.

## 2020-01-07 ENCOUNTER — Ambulatory Visit (HOSPITAL_COMMUNITY)
Admission: RE | Admit: 2020-01-07 | Discharge: 2020-01-07 | Disposition: A | Discharge status: Home / Self Care | Payer: Medicare Other | Source: Ambulatory Visit | Attending: Hematology | Admitting: Hematology

## 2020-01-07 ENCOUNTER — Telehealth: Payer: Self-pay | Admitting: Pulmonary Disease

## 2020-01-07 ENCOUNTER — Other Ambulatory Visit: Payer: Self-pay

## 2020-01-07 ENCOUNTER — Encounter (HOSPITAL_COMMUNITY): Payer: Medicare Other

## 2020-01-07 DIAGNOSIS — Z170 Estrogen receptor positive status [ER+]: Secondary | ICD-10-CM

## 2020-01-07 DIAGNOSIS — R922 Inconclusive mammogram: Secondary | ICD-10-CM | POA: Diagnosis not present

## 2020-01-07 DIAGNOSIS — C50412 Malignant neoplasm of upper-outer quadrant of left female breast: Secondary | ICD-10-CM | POA: Insufficient documentation

## 2020-01-07 LAB — CBC WITH DIFFERENTIAL/PLATELET
Basophils Absolute: 0 10*3/uL (ref 0.0–0.2)
Basos: 1 %
EOS (ABSOLUTE): 0.2 10*3/uL (ref 0.0–0.4)
Eos: 3 %
Hematocrit: 41.9 % (ref 34.0–46.6)
Hemoglobin: 13.4 g/dL (ref 11.1–15.9)
Immature Grans (Abs): 0 10*3/uL (ref 0.0–0.1)
Immature Granulocytes: 0 %
Lymphocytes Absolute: 2.4 10*3/uL (ref 0.7–3.1)
Lymphs: 47 %
MCH: 30.5 pg (ref 26.6–33.0)
MCHC: 32 g/dL (ref 31.5–35.7)
MCV: 95 fL (ref 79–97)
Monocytes Absolute: 0.5 10*3/uL (ref 0.1–0.9)
Monocytes: 10 %
Neutrophils Absolute: 2.1 10*3/uL (ref 1.4–7.0)
Neutrophils: 39 %
Platelets: 199 10*3/uL (ref 150–450)
RBC: 4.39 x10E6/uL (ref 3.77–5.28)
RDW: 13 % (ref 11.7–15.4)
WBC: 5.2 10*3/uL (ref 3.4–10.8)

## 2020-01-07 LAB — CMP14+EGFR
ALT: 9 IU/L (ref 0–32)
AST: 12 IU/L (ref 0–40)
Albumin/Globulin Ratio: 1.6 (ref 1.2–2.2)
Albumin: 4.1 g/dL (ref 3.7–4.7)
Alkaline Phosphatase: 112 IU/L (ref 44–121)
BUN/Creatinine Ratio: 20 (ref 12–28)
BUN: 13 mg/dL (ref 8–27)
Bilirubin Total: 0.3 mg/dL (ref 0.0–1.2)
CO2: 32 mmol/L — ABNORMAL HIGH (ref 20–29)
Calcium: 9.2 mg/dL (ref 8.7–10.3)
Chloride: 102 mmol/L (ref 96–106)
Creatinine, Ser: 0.65 mg/dL (ref 0.57–1.00)
GFR calc Af Amer: 103 mL/min/{1.73_m2} (ref >59)
GFR calc non Af Amer: 89 mL/min/{1.73_m2} (ref >59)
Globulin, Total: 2.6 g/dL (ref 1.5–4.5)
Glucose: 85 mg/dL (ref 65–99)
Potassium: 4.3 mmol/L (ref 3.5–5.2)
Sodium: 143 mmol/L (ref 134–144)
Total Protein: 6.7 g/dL (ref 6.0–8.5)

## 2020-01-07 LAB — LIPID PANEL
Chol/HDL Ratio: 2.7 ratio (ref 0.0–4.4)
Cholesterol, Total: 170 mg/dL (ref 100–199)
HDL: 64 mg/dL (ref >39)
LDL Chol Calc (NIH): 90 mg/dL (ref 0–99)
Triglycerides: 89 mg/dL (ref 0–149)
VLDL Cholesterol Cal: 16 mg/dL (ref 5–40)

## 2020-01-07 MED ORDER — BREZTRI AEROSPHERE 160-9-4.8 MCG/ACT IN AERO
2.0000 | INHALATION_SPRAY | Freq: Two times a day (BID) | RESPIRATORY_TRACT | 0 refills | Status: DC
Start: 2020-01-07 — End: 2020-01-07

## 2020-01-07 MED ORDER — BREZTRI AEROSPHERE 160-9-4.8 MCG/ACT IN AERO
2.0000 | INHALATION_SPRAY | Freq: Two times a day (BID) | RESPIRATORY_TRACT | 0 refills | Status: DC
Start: 2020-01-07 — End: 2020-08-04

## 2020-01-07 NOTE — Telephone Encounter (Signed)
Samples have been placed up front at [facility] office for pt. Attempted to call pt but line went straight to VM. Left pt a detailed message letting her know that samples had been placed up front for her at the [facility] office. Nothing further needed.

## 2020-01-07 NOTE — Addendum Note (Signed)
Addended by: Lorretta Harp on: 01/07/2020 12:16 PM   Modules accepted: Orders

## 2020-01-07 NOTE — Progress Notes (Signed)
Hello Joanne Kramer,  Your lab result is normal and/or stable.Some minor variations that are not significant are commonly marked abnormal, but do not represent any medical problem for you.  Best regards, Yoshiye Kraft, M.D.

## 2020-01-07 NOTE — Telephone Encounter (Signed)
Patient stopped in in person at the [place] office needing one sample of Breztri due to her current inhaler is not working and patient stated her pharmacy will be able to refill breztri this upcoming Monday 01/13/2020. One sample of breztri given per patient request. Lot number of sample is [id number], expires 06/28/2021 & [place] [id number]. Nothing further needed at this time.

## 2020-01-09 ENCOUNTER — Encounter: Payer: Self-pay | Admitting: Internal Medicine

## 2020-01-11 DIAGNOSIS — J449 Chronic obstructive pulmonary disease, unspecified: Secondary | ICD-10-CM | POA: Diagnosis not present

## 2020-01-14 ENCOUNTER — Ambulatory Visit: Payer: Medicare Other | Attending: Family Medicine | Admitting: Physical Therapy

## 2020-01-14 ENCOUNTER — Encounter: Payer: Self-pay | Admitting: Physical Therapy

## 2020-01-14 ENCOUNTER — Other Ambulatory Visit: Payer: Self-pay

## 2020-01-14 DIAGNOSIS — M25511 Pain in right shoulder: Secondary | ICD-10-CM | POA: Insufficient documentation

## 2020-01-14 DIAGNOSIS — G8929 Other chronic pain: Secondary | ICD-10-CM | POA: Diagnosis not present

## 2020-01-14 DIAGNOSIS — M25512 Pain in left shoulder: Secondary | ICD-10-CM | POA: Diagnosis not present

## 2020-01-14 NOTE — Therapy (Signed)
[facility] [place], [place], [address] Phone: [phone]   Fax:  [phone]  Physical Therapy Evaluation  Patient Details  Name: Joanne Kramer MRN: [id number] Date of Birth: [birth year] Referring Provider (PT): Claretta Fraise MD   Encounter Date: 01/14/2020   PT End of Session - 01/14/20 1329    Visit Number 1    Number of Visits 12    Date for PT Re-Evaluation 02/25/20    PT Start Time 0101    PT Stop Time 0129    PT Time Calculation (min) 28 min    Activity Tolerance Patient tolerated treatment well    Behavior During Therapy [facility] for tasks assessed/performed           Past Medical History:  Diagnosis Date  . Breast cancer ([place])   . Contact dermatitis    chronic itching but skin biopsy did not show anything specific  . COPD (chronic obstructive pulmonary disease) ([place])   . Emphysema of lung ([place])   . Headache   . 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 OA (osteoarthritis) of knee    right  . Pneumonia   . Requires supplemental oxygen     Past Surgical History:  Procedure Laterality Date  . BREAST LUMPECTOMY WITH RADIOACTIVE SEED AND SENTINEL LYMPH NODE BIOPSY Left 03/05/2018   Procedure: LEFT BREAST LUMPECTOMY WITH RADIOACTIVE SEED AND LEFT AXILLARY SENTINEL LYMPH NODE BIOPSY;  Surgeon: Rolm Bookbinder, MD;  Location: [facility];  Service: General;  Laterality: Left;  . BREAST SURGERY     left breast aspiration  . CESAREAN SECTION    . COLONOSCOPY    . EYE SURGERY    . KNEE SURGERY Right   . TONSILLECTOMY    . TOTAL KNEE ARTHROPLASTY Right 06/27/2017   Procedure: RIGHT TOTAL KNEE ARTHROPLASTY;  Surgeon: Garald Balding, MD;  Location: [facility];  Service: Orthopedics;  Laterality: Right;    There were no vitals filed for this visit.    Subjective Assessment - 01/14/20 1333    Subjective COVID-19 screen performed prior to patient entering clinic.  The patient reports following a breast  lumpectomy early last year she went of Chemo meds and began to experience bilateral shoulder pain.  She has been off the meds some several months now and is pleased to reports her recent mammogram was negative and there is no sign of cancer.  Unfortunately her shoulders continue to hurt though they seem to be improving.  She has quite bad pain with overhead reaching, espcially on the right.  Heat and massage helps decrease pain.    Pertinent History lumpectomy, THA, bilateral CTS, COPD.    Patient Stated Goals Use UE's without pain.    Currently in Pain? Yes    Pain Score 6     Pain Location Shoulder   Shoulders.   Pain Orientation Right;Left    Pain Descriptors / Indicators Aching;Sharp    Pain Type Chronic pain    Pain Onset More than a month ago    Pain Frequency Constant    Aggravating Factors  See above.    Pain Relieving Factors See above.              [facility] PT Assessment - 01/14/20 0001      Assessment   Medical Diagnosis Bilateral shoulder pain.    Referring Provider (PT) Claretta Fraise MD    Onset Date/Surgical Date --   02/2018.     Precautions  Precaution Comments Breast cancer (currently cancer-free).      Restrictions   Weight Bearing Restrictions No      Balance Screen   Has the patient fallen in the past 6 months No    Has the patient had a decrease in activity level because of a fear of falling?  No    Is the patient reluctant to leave their home because of a fear of falling?  No      Home Environment   Living Environment Private residence      Prior Function   Level of Independence Independent      Posture/Postural Control   Posture/Postural Control Postural limitations    Postural Limitations Rounded Shoulders;Forward head      AROM   Overall AROM Comments Right shoulder flexion= 140 degrees, left= 150 degrees.  RT ER= 70 degrees and left= 90 degrees.  Behind back on right to sacrum and left to L4-5.      Strength   Overall Strength Comments  Bilateral shoulder strength for major muscle groups is 4+/5.  Not testing left deltoid due to soreness from recent Covid shot.      Palpation   Palpation comment C/o pain over right middle deltoid and Triceps.        Special Tests   Other special tests (+) right shoulder Impingement test.  (-) Drop arm test.                      Objective measurements completed on examination: See above findings.                    PT Long Term Goals - 01/14/20 1411      PT LONG TERM GOAL #1   Title Independent with a HEP.    Time 6    Period Weeks    Status New      PT LONG TERM GOAL #2   Title Bilateral active shoulder flexion to 155 degrees so the patient can easily reach overhead.    Time 6    Period Weeks    Status New      PT LONG TERM GOAL #3   Title Active right ER to 80 degrees+ to allow for easily donning/doffing of apparel.    Time 6    Period Weeks    Status New      PT LONG TERM GOAL #4   Title Increase ROM so patient is able to reach behind back bilaterally to L2.    Time 6    Period Weeks    Status New      PT LONG TERM GOAL #5   Title Perform ADL's with pain not > 3/10.    Time 6    Period Weeks    Status New                  Plan - 01/14/20 1401    Clinical Impression Statement The patient presents to OPPT with c/o bilateral shoulder pain right > left due to medication following a lumpectomy last year.  She has some limitations of range of motion of both shoulders.  She demonstrates a positive right shoulder Impingement test.  Patient will benefit from skilled physical therapy intervention to address deficits and pain.    Personal Factors and Comorbidities Comorbidity 1;Comorbidity 2;Other    Comorbidities Lumpectomy, THA, bilateral CTS, COPD.    Examination-Activity Limitations Other;Reach Overhead    Examination-Participation Restrictions Other  Stability/Clinical Decision Making Stable/Uncomplicated    Clinical Decision  Making Low    Rehab Potential Excellent    PT Frequency 2x / week    PT Duration 6 weeks    PT Treatment/Interventions ADLs/Self Care Home Management;Cryotherapy;Electrical Stimulation;Moist Heat;Therapeutic activities;Therapeutic exercise;Manual techniques;Passive range of motion;Vasopneumatic Device    PT Next Visit Plan UBE, pain-free pulleys and/or UE Ranger, wall ladder, gentle towel stretch, RTC and scapular strengthening exercises.    Consulted and Agree with Plan of Care Patient           Patient will benefit from skilled therapeutic intervention in order to improve the following deficits and impairments:  Pain, Decreased activity tolerance, Decreased range of motion  Visit Diagnosis: Chronic right shoulder pain - Plan: PT plan of care cert/re-cert  Chronic left shoulder pain - Plan: PT plan of care cert/re-cert     Problem List Patient Active Problem List   Diagnosis Date Noted  . Osteoporosis 08/09/2018  . Neuropathy 02/18/2018  . Malignant neoplasm of upper-outer quadrant of left breast in female, estrogen receptor positive ([place]) 01/16/2018  . Total knee replacement status, right 06/27/2017  . Primary osteoarthritis of right knee 02/15/2017  . Primary osteoarthritis of left knee 02/15/2017  . 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 Cephalalgia 02/15/2017  . Chronic obstructive pulmonary disease ([place]) 11/08/2016  . Pure hypercholesterolemia 11/08/2016  . Hypoxia 11/08/2016  . Tobacco abuse 11/08/2016    Genessis Flanary, [place] MPT 01/14/2020, 2:16 PM  [facility] [address] [place], [place], [address] Phone: [phone]   Fax:  [phone]  Name: Joanne Kramer MRN: [id number] Date of Birth: 30-Aug-1945

## 2020-01-16 ENCOUNTER — Other Ambulatory Visit: Payer: Self-pay

## 2020-01-16 ENCOUNTER — Ambulatory Visit: Payer: Medicare Other | Admitting: Physical Therapy

## 2020-01-16 DIAGNOSIS — G8929 Other chronic pain: Secondary | ICD-10-CM

## 2020-01-16 DIAGNOSIS — M25512 Pain in left shoulder: Secondary | ICD-10-CM | POA: Diagnosis not present

## 2020-01-16 DIAGNOSIS — M25511 Pain in right shoulder: Secondary | ICD-10-CM

## 2020-01-16 NOTE — Patient Instructions (Signed)
   Wall slide  Place your hand on the wall. Begin to slide your hand up the wall. Walk toward the wall to gently get into the stretch. Do not lean your body weight into your arm. 10reps 1-3x day   CANE EXTERNAL ROTATION STRETCH - PILLOW  Lie on your back with a pillow under your affect shoulder. Hold a cane or wand with both hands.   On the affected side, maintain a 90 degree bend at the elbow with your arm approximately 30-45 degrees away from your side.

## 2020-01-16 NOTE — Therapy (Signed)
[facility] [place], [place], [address] Phone: [phone]   Fax:  [phone]  Physical Therapy Treatment  Patient Details  Name: Joanne Kramer MRN: [id number] Date of Birth: 20-Jul-1945 Referring Provider (PT): Claretta Fraise MD   Encounter Date: 01/16/2020   PT End of Session - 01/16/20 1444    Visit Number 2    Number of Visits 12    Date for PT Re-Evaluation 02/25/20    PT Start Time 0231    PT Stop Time 0314    PT Time Calculation (min) 43 min    Activity Tolerance Patient tolerated treatment well    Behavior During Therapy [facility] for tasks assessed/performed           Past Medical History:  Diagnosis Date  . Breast cancer ([place])   . Contact dermatitis    chronic itching but skin biopsy did not show anything specific  . COPD (chronic obstructive pulmonary disease) ([place])   . Emphysema of lung ([place])   . Headache   . 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 OA (osteoarthritis) of knee    right  . Pneumonia   . Requires supplemental oxygen     Past Surgical History:  Procedure Laterality Date  . BREAST LUMPECTOMY WITH RADIOACTIVE SEED AND SENTINEL LYMPH NODE BIOPSY Left 03/05/2018   Procedure: LEFT BREAST LUMPECTOMY WITH RADIOACTIVE SEED AND LEFT AXILLARY SENTINEL LYMPH NODE BIOPSY;  Surgeon: Rolm Bookbinder, MD;  Location: [place];  Service: General;  Laterality: Left;  . BREAST SURGERY     left breast aspiration  . CESAREAN SECTION    . COLONOSCOPY    . EYE SURGERY    . KNEE SURGERY Right   . TONSILLECTOMY    . TOTAL KNEE ARTHROPLASTY Right 06/27/2017   Procedure: RIGHT TOTAL KNEE ARTHROPLASTY;  Surgeon: Garald Balding, MD;  Location: [place];  Service: Orthopedics;  Laterality: Right;    There were no vitals filed for this visit.   Subjective Assessment - 01/16/20 1434    Subjective COVID-19 screening performed upon arrival. Patient arrived with some discomfort today    Pertinent  History lumpectomy, THA, bilateral CTS, COPD.    Patient Stated Goals Use UE's without pain.    Currently in Pain? Yes    Pain Score 4     Pain Location Shoulder    Pain Orientation Right;Left    Pain Descriptors / Indicators Discomfort;Aching    Pain Type Chronic pain    Pain Onset More than a month ago    Aggravating Factors  increased movement    Pain Relieving Factors rest                             [facility] Adult PT Treatment/Exercise - 01/16/20 0001      Exercises   Exercises Shoulder      Shoulder Exercises: Supine   Other Supine Exercises supine cane ER Bil sides x10-20      Shoulder Exercises: Seated   Retraction Strengthening;Both;20 reps    Diagonals AAROM;Both;20 reps    Diagonals Limitations with cane      Shoulder Exercises: Standing   Other Standing Exercises blue swiss ball on table all directions Bil UE    Other Standing Exercises wall slide bil UE      Shoulder Exercises: Pulleys   Flexion 3 minutes    Other Pulley Exercises seated UE ranger Bil for flexion 2 min  and circles 83min each way (28min total)      Shoulder Exercises: Stretch   Other Shoulder Stretches self ant/post cap stretch Bil 20sec x3 each    Other Shoulder Stretches Bil self IR strech                   PT Education - 01/16/20 1450    Education Details HEP AAROM flexion/ER    Person(s) Educated Patient    Methods Explanation;Demonstration;Handout    Comprehension Verbalized understanding;Returned demonstration               PT Long Term Goals - 01/14/20 1411      PT LONG TERM GOAL #1   Title Independent with a HEP.    Time 6    Period Weeks    Status New      PT LONG TERM GOAL #2   Title Bilateral active shoulder flexion to 155 degrees so the patient can easily reach overhead.    Time 6    Period Weeks    Status New      PT LONG TERM GOAL #3   Title Active right ER to 80 degrees+ to allow for easily donning/doffing of apparel.    Time 6     Period Weeks    Status New      PT LONG TERM GOAL #4   Title Increase ROM so patient is able to reach behind back bilaterally to L2.    Time 6    Period Weeks    Status New      PT LONG TERM GOAL #5   Title Perform ADL's with pain not > 3/10.    Time 6    Period Weeks    Status New                 Plan - 01/16/20 1504    Clinical Impression Statement Patient tolerated treatment well today. Today focused on ROM for bil shoulders. Issued HEP for flexion and ER active assistive to improve mobility. Patient continues to have discomfort with certain positions. Patient current goals ongoing at this time.    Personal Factors and Comorbidities Comorbidity 1;Comorbidity 2;Other    Comorbidities Lumpectomy, THA, bilateral CTS, COPD.    Examination-Activity Limitations Other;Reach Overhead    Examination-Participation Restrictions Other    Stability/Clinical Decision Making Stable/Uncomplicated    Rehab Potential Excellent    PT Frequency 2x / week    PT Duration 6 weeks    PT Treatment/Interventions ADLs/Self Care Home Management;Cryotherapy;Electrical Stimulation;Moist Heat;Therapeutic activities;Therapeutic exercise;Manual techniques;Passive range of motion;Vasopneumatic Device    PT Next Visit Plan cont with POC for pain-free pulleys and/or UE Ranger, wall ladder, gentle towel stretch, RTC and scapular strengthening exercises. (monitor SOB due to COPD)    Consulted and Agree with Plan of Care Patient           Patient will benefit from skilled therapeutic intervention in order to improve the following deficits and impairments:  Pain, Decreased activity tolerance, Decreased range of motion  Visit Diagnosis: Chronic right shoulder pain  Chronic left shoulder pain     Problem List Patient Active Problem List   Diagnosis Date Noted  . Osteoporosis 08/09/2018  . Neuropathy 02/18/2018  . Malignant neoplasm of upper-outer quadrant of left breast in female, estrogen receptor  positive ([place]) 01/16/2018  . Total knee replacement status, right 06/27/2017  . Primary osteoarthritis of right knee 02/15/2017  . Primary osteoarthritis of left knee 02/15/2017  . Disorder of  left common peroneal nerve 02/15/2017  . Cephalalgia 02/15/2017  . Chronic obstructive pulmonary disease ([place]) 11/08/2016  . Pure hypercholesterolemia 11/08/2016  . Hypoxia 11/08/2016  . Tobacco abuse 11/08/2016    Phillips Climes, PTA 01/16/2020, 3:16 PM  [facility] [place], [place], [address] Phone: [phone]   Fax:  [phone]  Name: Joanne Kramer MRN: [id number] Date of Birth: August 11, 1945

## 2020-01-20 DIAGNOSIS — G629 Polyneuropathy, unspecified: Secondary | ICD-10-CM | POA: Diagnosis not present

## 2020-01-20 DIAGNOSIS — R202 Paresthesia of skin: Secondary | ICD-10-CM | POA: Diagnosis not present

## 2020-01-20 DIAGNOSIS — G5603 Carpal tunnel syndrome, bilateral upper limbs: Secondary | ICD-10-CM | POA: Diagnosis not present

## 2020-01-20 DIAGNOSIS — Z72820 Sleep deprivation: Secondary | ICD-10-CM | POA: Diagnosis not present

## 2020-01-20 DIAGNOSIS — M79643 Pain in unspecified hand: Secondary | ICD-10-CM | POA: Diagnosis not present

## 2020-01-21 ENCOUNTER — Encounter: Payer: Self-pay | Admitting: Physical Therapy

## 2020-01-21 ENCOUNTER — Other Ambulatory Visit: Payer: Self-pay

## 2020-01-21 ENCOUNTER — Ambulatory Visit: Payer: Medicare Other | Admitting: Physical Therapy

## 2020-01-21 DIAGNOSIS — M25512 Pain in left shoulder: Secondary | ICD-10-CM

## 2020-01-21 DIAGNOSIS — M25511 Pain in right shoulder: Secondary | ICD-10-CM | POA: Diagnosis not present

## 2020-01-21 DIAGNOSIS — G8929 Other chronic pain: Secondary | ICD-10-CM | POA: Diagnosis not present

## 2020-01-21 NOTE — Therapy (Signed)
[facility] [place], [place], [address] Phone: [phone]   Fax:  [phone]  Physical Therapy Treatment  Patient Details  Name: Joanne Kramer MRN: [id number] Date of Birth: [birth year] Referring Provider (PT): Claretta Fraise MD   Encounter Date: 01/21/2020   PT End of Session - 01/21/20 1434    Visit Number 3    Number of Visits 12    Date for PT Re-Evaluation 02/25/20    PT Start Time 6962    PT Stop Time 1514    PT Time Calculation (min) 43 min    Activity Tolerance Patient tolerated treatment well    Behavior During Therapy [facility] for tasks assessed/performed           Past Medical History:  Diagnosis Date  . Breast cancer ([place])   . Contact dermatitis    chronic itching but skin biopsy did not show anything specific  . COPD (chronic obstructive pulmonary disease) ([place])   . Emphysema of lung ([place])   . Headache   . 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 OA (osteoarthritis) of knee    right  . Pneumonia   . Requires supplemental oxygen     Past Surgical History:  Procedure Laterality Date  . BREAST LUMPECTOMY WITH RADIOACTIVE SEED AND SENTINEL LYMPH NODE BIOPSY Left 03/05/2018   Procedure: LEFT BREAST LUMPECTOMY WITH RADIOACTIVE SEED AND LEFT AXILLARY SENTINEL LYMPH NODE BIOPSY;  Surgeon: Rolm Bookbinder, MD;  Location: [place] [facility];  Service: General;  Laterality: Left;  . BREAST SURGERY     left breast aspiration  . CESAREAN SECTION    . COLONOSCOPY    . EYE SURGERY    . KNEE SURGERY Right   . TONSILLECTOMY    . TOTAL KNEE ARTHROPLASTY Right 06/27/2017   Procedure: RIGHT TOTAL KNEE ARTHROPLASTY;  Surgeon: Garald Balding, MD;  Location: [facility];  Service: Orthopedics;  Laterality: Right;    There were no vitals filed for this visit.   Subjective Assessment - 01/21/20 1432    Subjective COVID-19 screening performed upon arrival. Patient arrived with some discomfort today    Pertinent  History lumpectomy, THA, bilateral CTS, COPD.    Patient Stated Goals Use UE's without pain.    Currently in Pain? Yes    Pain Score 2     Pain Location Shoulder    Pain Orientation Right;Left    Pain Descriptors / Indicators Dull;Aching    Pain Type Chronic pain    Pain Onset More than a month ago    Pain Frequency Constant              [facility] PT Assessment - 01/21/20 0001      Assessment   Medical Diagnosis Bilateral shoulder pain.    Referring Provider (PT) Claretta Fraise MD    Onset Date/Surgical Date 03/05/18      Precautions   Precaution Comments Breast cancer (currently cancer-free).      Restrictions   Weight Bearing Restrictions No                         [facility] Adult PT Treatment/Exercise - 01/21/20 0001      Shoulder Exercises: Seated   External Rotation AROM;Both;20 reps    Flexion AROM;Both;20 reps    Flexion Limitations short lever    Abduction AROM;Both;20 reps    ABduction Limitations short lever      Shoulder Exercises: Prone   Retraction AROM;Both;20 reps  Extension AROM;Both;20 reps      Shoulder Exercises: Standing   Other Standing Exercises ABCs on table BUE x1 rep    Other Standing Exercises wall ladder x6 reps each to level 27      Shoulder Exercises: Pulleys   Flexion 5 minutes    Other Pulley Exercises Seated UE ranger into flex/ext x2 min each      Shoulder Exercises: ROM/Strengthening   UBE (Upper Arm Bike) 120 RPM x8 min (forward/backward)                       PT Long Term Goals - 01/21/20 1539      PT LONG TERM GOAL #1   Title Independent with a HEP.    Time 6    Period Weeks    Status Achieved      PT LONG TERM GOAL #2   Title Bilateral active shoulder flexion to 155 degrees so the patient can easily reach overhead.    Time 6    Period Weeks    Status On-going      PT LONG TERM GOAL #3   Title Active right ER to 80 degrees+ to allow for easily donning/doffing of apparel.    Time 6     Period Weeks    Status On-going      PT LONG TERM GOAL #4   Title Increase ROM so patient is able to reach behind back bilaterally to L2.    Time 6    Period Weeks    Status On-going      PT LONG TERM GOAL #5   Title Perform ADL's with pain not > 3/10.    Time 6    Period Weeks    Status On-going                 Plan - 01/21/20 1526    Clinical Impression Statement Patient presented in clinic with reports of continued B ache. Patient reports that dull, ache of B shoulder is different than nerve related pain in LEs and hands. Short lever arm exercises completed today with reports of discomfort with shoulder abduction. Patient limited with lifting items into flexion and horizontal adduction. B anterior humeral pain reported by patient with wall ladder as UE pronated and in IR. Patient compliant with HEP.    Personal Factors and Comorbidities Comorbidity 1;Comorbidity 2;Other    Comorbidities Lumpectomy, THA, bilateral CTS, COPD.    Examination-Activity Limitations Other;Reach Overhead    Examination-Participation Restrictions Other    Stability/Clinical Decision Making Stable/Uncomplicated    Rehab Potential Excellent    PT Frequency 2x / week    PT Duration 6 weeks    PT Treatment/Interventions ADLs/Self Care Home Management;Cryotherapy;Electrical Stimulation;Moist Heat;Therapeutic activities;Therapeutic exercise;Manual techniques;Passive range of motion;Vasopneumatic Device    PT Next Visit Plan cont with POC for pain-free pulleys and/or UE Ranger, wall ladder, gentle towel stretch, RTC and scapular strengthening exercises. (monitor SOB due to COPD)    Consulted and Agree with Plan of Care Patient           Patient will benefit from skilled therapeutic intervention in order to improve the following deficits and impairments:  Pain, Decreased activity tolerance, Decreased range of motion  Visit Diagnosis: Chronic right shoulder pain  Chronic left shoulder  pain     Problem List Patient Active Problem List   Diagnosis Date Noted  . Osteoporosis 08/09/2018  . Neuropathy 02/18/2018  . Malignant neoplasm of upper-outer quadrant of left  breast in female, estrogen receptor positive ([place]) 01/16/2018  . Total knee replacement status, right 06/27/2017  . Primary osteoarthritis of right knee 02/15/2017  . Primary osteoarthritis of left knee 02/15/2017  . 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 Cephalalgia 02/15/2017  . Chronic obstructive pulmonary disease ([place]) 11/08/2016  . Pure hypercholesterolemia 11/08/2016  . Hypoxia 11/08/2016  . Tobacco abuse 11/08/2016    Standley Brooking, PTA 01/21/2020, 3:39 PM  [facility] [address] [place], [place], [address] Phone: [phone]   Fax:  [phone]  Name: Joanne Kramer MRN: [id number] Date of Birth: 1945-05-30

## 2020-01-28 ENCOUNTER — Other Ambulatory Visit: Payer: Self-pay

## 2020-01-28 ENCOUNTER — Ambulatory Visit: Payer: Medicare Other | Admitting: Physical Therapy

## 2020-01-28 ENCOUNTER — Encounter: Payer: Self-pay | Admitting: Physical Therapy

## 2020-01-28 DIAGNOSIS — M25511 Pain in right shoulder: Secondary | ICD-10-CM | POA: Diagnosis not present

## 2020-01-28 DIAGNOSIS — M25512 Pain in left shoulder: Secondary | ICD-10-CM

## 2020-01-28 DIAGNOSIS — G8929 Other chronic pain: Secondary | ICD-10-CM | POA: Diagnosis not present

## 2020-01-28 DIAGNOSIS — J449 Chronic obstructive pulmonary disease, unspecified: Secondary | ICD-10-CM | POA: Diagnosis not present

## 2020-01-28 NOTE — Therapy (Signed)
[facility] [place], [place], [address] Phone: [phone]   Fax:  [phone]  Physical Therapy Treatment  Patient Details  Name: Joanne Kramer MRN: [id number] Date of Birth: August 27, 1945 Referring Provider (PT): Claretta Fraise MD   Encounter Date: 01/28/2020   PT End of Session - 01/28/20 1721    Visit Number 4    Number of Visits 12    Date for PT Re-Evaluation 02/25/20    PT Start Time 5053    PT Stop Time 1601    PT Time Calculation (min) 46 min    Activity Tolerance Patient tolerated treatment well    Behavior During Therapy [facility] for tasks assessed/performed           Past Medical History:  Diagnosis Date  . Breast cancer ([place])   . Contact dermatitis    chronic itching but skin biopsy did not show anything specific  . COPD (chronic obstructive pulmonary disease) ([place])   . Emphysema of lung ([place])   . Headache   . 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 OA (osteoarthritis) of knee    right  . Pneumonia   . Requires supplemental oxygen     Past Surgical History:  Procedure Laterality Date  . BREAST LUMPECTOMY WITH RADIOACTIVE SEED AND SENTINEL LYMPH NODE BIOPSY Left 03/05/2018   Procedure: LEFT BREAST LUMPECTOMY WITH RADIOACTIVE SEED AND LEFT AXILLARY SENTINEL LYMPH NODE BIOPSY;  Surgeon: Rolm Bookbinder, MD;  Location: [place];  Service: General;  Laterality: Left;  . BREAST SURGERY     left breast aspiration  . CESAREAN SECTION    . COLONOSCOPY    . EYE SURGERY    . KNEE SURGERY Right   . TONSILLECTOMY    . TOTAL KNEE ARTHROPLASTY Right 06/27/2017   Procedure: RIGHT TOTAL KNEE ARTHROPLASTY;  Surgeon: Garald Balding, MD;  Location: [facility];  Service: Orthopedics;  Laterality: Right;    There were no vitals filed for this visit.   Subjective Assessment - 01/28/20 1525    Subjective COVID-19 screening performed upon arrival. Patient reports doing well, 2/10 bilaterally.    Pertinent  History lumpectomy, THA, bilateral CTS, COPD.    Patient Stated Goals Use UE's without pain.    Currently in Pain? Yes    Pain Score 2     Pain Location Shoulder    Pain Orientation Right;Left    Pain Descriptors / Indicators Aching;Dull    Pain Type Chronic pain    Pain Onset More than a month ago    Pain Frequency Constant              [facility] PT Assessment - 01/28/20 0001      Assessment   Medical Diagnosis Bilateral shoulder pain.    Referring Provider (PT) Claretta Fraise MD    Onset Date/Surgical Date 03/05/18      Precautions   Precaution Comments Breast cancer (currently cancer-free).      Restrictions   Weight Bearing Restrictions No                         [facility] Adult PT Treatment/Exercise - 01/28/20 0001      Shoulder Exercises: Supine   Protraction AAROM;20 reps;5 reps    External Rotation AROM;Both;20 reps    Internal Rotation AROM;Both;20 reps    Diagonals AROM;20 reps    Diagonals Limitations D2 flexion bilateral    Other Supine Exercises circles CW, CCW long lever  arm x10 each      Shoulder Exercises: Seated   Retraction Strengthening;Both;20 reps    Flexion AROM;Both;20 reps    Flexion Limitations short lever, upper cuts      Shoulder Exercises: Pulleys   Flexion 5 minutes      Shoulder Exercises: ROM/Strengthening   UBE (Upper Arm Bike) 120 RPM x8 min (forward/backward)    Wall Wash bilateral flexion, cw and ccw circles. x2 mins each for flexion x1 min each for circles (8 mins total)                       PT Long Term Goals - 01/21/20 1539      PT LONG TERM GOAL #1   Title Independent with a HEP.    Time 6    Period Weeks    Status Achieved      PT LONG TERM GOAL #2   Title Bilateral active shoulder flexion to 155 degrees so the patient can easily reach overhead.    Time 6    Period Weeks    Status On-going      PT LONG TERM GOAL #3   Title Active right ER to 80 degrees+ to allow for easily donning/doffing  of apparel.    Time 6    Period Weeks    Status On-going      PT LONG TERM GOAL #4   Title Increase ROM so patient is able to reach behind back bilaterally to L2.    Time 6    Period Weeks    Status On-going      PT LONG TERM GOAL #5   Title Perform ADL's with pain not > 3/10.    Time 6    Period Weeks    Status On-going                 Plan - 01/28/20 1716    Clinical Impression Statement Patient arrived to physical therapy with mild reports of bilateral shoulder pain. Patient guided through TEs with verbal cuing for form and technique. Patient provided with intermittent rest breaks to decrease pain and fatigue. Supine horizontal abduction attempted but with pain therefore terminated. Palpable crepitus in Left shoulder with mild/moderate pain. Patient educated finding balance with ROM and strenghtening to decrease crepitus.    Personal Factors and Comorbidities Comorbidity 1;Comorbidity 2;Other    Comorbidities Lumpectomy, THA, bilateral CTS, COPD.    Examination-Activity Limitations Other;Reach Overhead    Examination-Participation Restrictions Other    Stability/Clinical Decision Making Stable/Uncomplicated    Clinical Decision Making Low    Rehab Potential Excellent    PT Frequency 2x / week    PT Duration 6 weeks    PT Treatment/Interventions ADLs/Self Care Home Management;Cryotherapy;Electrical Stimulation;Moist Heat;Therapeutic activities;Therapeutic exercise;Manual techniques;Passive range of motion;Vasopneumatic Device    PT Next Visit Plan cont with POC for pain-free pulleys and/or UE Ranger, wall ladder, gentle towel stretch, RTC and scapular strengthening exercises. (monitor SOB due to COPD)    Consulted and Agree with Plan of Care Patient           Patient will benefit from skilled therapeutic intervention in order to improve the following deficits and impairments:  Pain, Decreased activity tolerance, Decreased range of motion  Visit Diagnosis: Chronic  right shoulder pain  Chronic left shoulder pain     Problem List Patient Active Problem List   Diagnosis Date Noted  . Osteoporosis 08/09/2018  . Neuropathy 02/18/2018  . Malignant neoplasm of upper-outer  quadrant of left breast in female, estrogen receptor positive ([place]) 01/16/2018  . Total knee replacement status, right 06/27/2017  . Primary osteoarthritis of right knee 02/15/2017  . Primary osteoarthritis of left knee 02/15/2017  . 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 Cephalalgia 02/15/2017  . Chronic obstructive pulmonary disease ([place]) 11/08/2016  . Pure hypercholesterolemia 11/08/2016  . Hypoxia 11/08/2016  . Tobacco abuse 11/08/2016    Gabriela Eves, PT, DPT 01/28/2020, 5:22 PM  [facility] [address] [place], [place], [address] Phone: [phone]   Fax:  [phone]  Name: Joanne Kramer MRN: [id number] Date of Birth: [birth year]

## 2020-01-30 ENCOUNTER — Encounter: Payer: Medicare Other | Admitting: Physical Therapy

## 2020-01-30 DIAGNOSIS — Z853 Personal history of malignant neoplasm of breast: Secondary | ICD-10-CM | POA: Diagnosis not present

## 2020-01-30 DIAGNOSIS — C50411 Malignant neoplasm of upper-outer quadrant of right female breast: Secondary | ICD-10-CM | POA: Diagnosis not present

## 2020-01-30 DIAGNOSIS — Z9229 Personal history of other drug therapy: Secondary | ICD-10-CM | POA: Diagnosis not present

## 2020-01-30 DIAGNOSIS — Z9889 Other specified postprocedural states: Secondary | ICD-10-CM | POA: Diagnosis not present

## 2020-01-30 DIAGNOSIS — Z08 Encounter for follow-up examination after completed treatment for malignant neoplasm: Secondary | ICD-10-CM | POA: Diagnosis not present

## 2020-01-30 DIAGNOSIS — M2559 Pain in other specified joint: Secondary | ICD-10-CM | POA: Diagnosis not present

## 2020-01-30 DIAGNOSIS — R911 Solitary pulmonary nodule: Secondary | ICD-10-CM | POA: Diagnosis not present

## 2020-01-30 DIAGNOSIS — Z170 Estrogen receptor positive status [ER+]: Secondary | ICD-10-CM | POA: Diagnosis not present

## 2020-01-30 DIAGNOSIS — F17210 Nicotine dependence, cigarettes, uncomplicated: Secondary | ICD-10-CM | POA: Diagnosis not present

## 2020-02-04 ENCOUNTER — Other Ambulatory Visit: Payer: Self-pay

## 2020-02-04 ENCOUNTER — Ambulatory Visit: Payer: Medicare Other | Attending: Family Medicine | Admitting: *Deleted

## 2020-02-04 DIAGNOSIS — Z9889 Other specified postprocedural states: Secondary | ICD-10-CM | POA: Diagnosis not present

## 2020-02-04 DIAGNOSIS — Z483 Aftercare following surgery for neoplasm: Secondary | ICD-10-CM | POA: Diagnosis not present

## 2020-02-04 DIAGNOSIS — M25511 Pain in right shoulder: Secondary | ICD-10-CM | POA: Diagnosis not present

## 2020-02-04 DIAGNOSIS — R293 Abnormal posture: Secondary | ICD-10-CM | POA: Insufficient documentation

## 2020-02-04 DIAGNOSIS — G8929 Other chronic pain: Secondary | ICD-10-CM | POA: Diagnosis not present

## 2020-02-04 DIAGNOSIS — M25512 Pain in left shoulder: Secondary | ICD-10-CM | POA: Insufficient documentation

## 2020-02-04 NOTE — Therapy (Signed)
[facility] [place], [place], [address] Phone: [phone]   Fax:  [phone]  Physical Therapy Treatment  Patient Details  Name: Joanne Kramer MRN: [id number] Date of Birth: [birth year] Referring Provider (PT): Joanne Fraise MD   Encounter Date: 02/04/2020   PT End of Session - 02/04/20 1754    Visit Number 5    Number of Visits 12    Date for PT Re-Evaluation 02/25/20    PT Start Time 1430    PT Stop Time 1518    PT Time Calculation (min) 48 min           Past Medical History:  Diagnosis Date  . Breast cancer ([place])   . Contact dermatitis    chronic itching but skin biopsy did not show anything specific  . COPD (chronic obstructive pulmonary disease) ([place])   . Emphysema of lung ([place])   . Headache   . 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 OA (osteoarthritis) of knee    right  . Pneumonia   . Requires supplemental oxygen     Past Surgical History:  Procedure Laterality Date  . BREAST LUMPECTOMY WITH RADIOACTIVE SEED AND SENTINEL LYMPH NODE BIOPSY Left 03/05/2018   Procedure: LEFT BREAST LUMPECTOMY WITH RADIOACTIVE SEED AND LEFT AXILLARY SENTINEL LYMPH NODE BIOPSY;  Surgeon: Rolm Bookbinder, MD;  Location: [place];  Service: General;  Laterality: Left;  . BREAST SURGERY     left breast aspiration  . CESAREAN SECTION    . COLONOSCOPY    . EYE SURGERY    . KNEE SURGERY Right   . TONSILLECTOMY    . TOTAL KNEE ARTHROPLASTY Right 06/27/2017   Procedure: RIGHT TOTAL KNEE ARTHROPLASTY;  Surgeon: Garald Balding, MD;  Location: [place];  Service: Orthopedics;  Laterality: Right;    There were no vitals filed for this visit.   Subjective Assessment - 02/04/20 1435    Subjective COVID-19 screening performed upon arrival. Patient reports doing well, 2-3/10 Bil shldrs    Pertinent History lumpectomy, THA, bilateral CTS, COPD.    Patient Stated Goals Use UE's without pain.    Currently in Pain? Yes     Pain Score 3     Pain Location Shoulder    Pain Orientation Right;Left    Pain Descriptors / Indicators Aching    Pain Type Chronic pain                             [facility] Adult PT Treatment/Exercise - 02/04/20 0001      Shoulder Exercises: Standing   Protraction Both;20 reps    Theraband Level (Shoulder Protraction) Level 1 (Yellow)    External Rotation AROM;Both;20 reps;Theraband    Theraband Level (Shoulder External Rotation) Level 1 (Yellow)    Internal Rotation Strengthening;Both;20 reps;Theraband    Theraband Level (Shoulder Internal Rotation) Level 1 (Yellow)    Row Strengthening;20 reps;Theraband    Theraband Level (Shoulder Row) Level 1 (Yellow)      Shoulder Exercises: Pulleys   Flexion 5 minutes    Other Pulley Exercises Standing UE ranger into flex/ext x2 min each      Shoulder Exercises: ROM/Strengthening   UBE (Upper Arm Bike) 90  RPM x8 min (forward/backward)                       PT Long Term Goals - 01/21/20 1539      PT  LONG TERM GOAL #1   Title Independent with a HEP.    Time 6    Period Weeks    Status Achieved      PT LONG TERM GOAL #2   Title Bilateral active shoulder flexion to 155 degrees so the patient can easily reach overhead.    Time 6    Period Weeks    Status On-going      PT LONG TERM GOAL #3   Title Active right ER to 80 degrees+ to allow for easily donning/doffing of apparel.    Time 6    Period Weeks    Status On-going      PT LONG TERM GOAL #4   Title Increase ROM so patient is able to reach behind back bilaterally to L2.    Time 6    Period Weeks    Status On-going      PT LONG TERM GOAL #5   Title Perform ADL's with pain not > 3/10.    Time 6    Period Weeks    Status On-going                 Plan - 02/04/20 1756    Clinical Impression Statement Pt arrived today doing fairly well and was able to perform light strengthening exs with tband for both shldrs with mainly fatigue end  of Rx.    Personal Factors and Comorbidities Comorbidity 1;Comorbidity 2;Other    Comorbidities Lumpectomy, THA, bilateral CTS, COPD.    PT Frequency 2x / week    PT Duration 6 weeks    PT Treatment/Interventions ADLs/Self Care Home Management;Cryotherapy;Electrical Stimulation;Moist Heat;Therapeutic activities;Therapeutic exercise;Manual techniques;Passive range of motion;Vasopneumatic Device    PT Next Visit Plan cont with POC for pain-free pulleys and/or UE Ranger, wall ladder, gentle towel stretch, RTC and scapular strengthening exercises. (monitor SOB due to COPD)    Consulted and Agree with Plan of Care Patient           Patient will benefit from skilled therapeutic intervention in order to improve the following deficits and impairments:  Pain, Decreased activity tolerance, Decreased range of motion  Visit Diagnosis: Chronic right shoulder pain  Chronic left shoulder pain  Aftercare following surgery for neoplasm  Status post left breast lumpectomy  Abnormal posture     Problem List Patient Active Problem List   Diagnosis Date Noted  . Osteoporosis 08/09/2018  . Neuropathy 02/18/2018  . Malignant neoplasm of upper-outer quadrant of left breast in female, estrogen receptor positive ([place]) 01/16/2018  . Total knee replacement status, right 06/27/2017  . Primary osteoarthritis of right knee 02/15/2017  . Primary osteoarthritis of left knee 02/15/2017  . 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 Cephalalgia 02/15/2017  . Chronic obstructive pulmonary disease ([place]) 11/08/2016  . Pure hypercholesterolemia 11/08/2016  . Hypoxia 11/08/2016  . Tobacco abuse 11/08/2016    [name],[name], PTA 02/04/2020, 6:19 PM  [facility] [place], [place], [address] Phone: [phone]   Fax:  [phone]  Name: Joanne Kramer MRN: [id number] Date of Birth: 09-27-1945

## 2020-02-06 ENCOUNTER — Ambulatory Visit: Payer: Medicare Other | Admitting: *Deleted

## 2020-02-06 ENCOUNTER — Other Ambulatory Visit: Payer: Self-pay

## 2020-02-06 DIAGNOSIS — Z9889 Other specified postprocedural states: Secondary | ICD-10-CM | POA: Diagnosis not present

## 2020-02-06 DIAGNOSIS — R293 Abnormal posture: Secondary | ICD-10-CM

## 2020-02-06 DIAGNOSIS — M25512 Pain in left shoulder: Secondary | ICD-10-CM | POA: Diagnosis not present

## 2020-02-06 DIAGNOSIS — M25511 Pain in right shoulder: Secondary | ICD-10-CM | POA: Diagnosis not present

## 2020-02-06 DIAGNOSIS — G8929 Other chronic pain: Secondary | ICD-10-CM

## 2020-02-06 DIAGNOSIS — Z483 Aftercare following surgery for neoplasm: Secondary | ICD-10-CM

## 2020-02-06 NOTE — Therapy (Signed)
[facility] [place], [place], [address] Phone: [phone]   Fax:  [phone]  Physical Therapy Treatment  Patient Details  Name: Joanne Kramer MRN: [id number] Date of Birth: [birth year] Referring Provider (PT): Claretta Fraise MD   Encounter Date: 02/06/2020   PT End of Session - 02/06/20 1313    Visit Number 6    Number of Visits 12    Date for PT Re-Evaluation 02/25/20    PT Start Time 1300    PT Stop Time 1346    PT Time Calculation (min) 46 min           Past Medical History:  Diagnosis Date  . Breast cancer ([place])   . Contact dermatitis    chronic itching but skin biopsy did not show anything specific  . COPD (chronic obstructive pulmonary disease) ([place])   . Emphysema of lung ([place])   . Headache   . 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 OA (osteoarthritis) of knee    right  . Pneumonia   . Requires supplemental oxygen     Past Surgical History:  Procedure Laterality Date  . BREAST LUMPECTOMY WITH RADIOACTIVE SEED AND SENTINEL LYMPH NODE BIOPSY Left 03/05/2018   Procedure: LEFT BREAST LUMPECTOMY WITH RADIOACTIVE SEED AND LEFT AXILLARY SENTINEL LYMPH NODE BIOPSY;  Surgeon: Rolm Bookbinder, MD;  Location: [place];  Service: General;  Laterality: Left;  . BREAST SURGERY     left breast aspiration  . CESAREAN SECTION    . COLONOSCOPY    . EYE SURGERY    . KNEE SURGERY Right   . TONSILLECTOMY    . TOTAL KNEE ARTHROPLASTY Right 06/27/2017   Procedure: RIGHT TOTAL KNEE ARTHROPLASTY;  Surgeon: Garald Balding, MD;  Location: [place];  Service: Orthopedics;  Laterality: Right;    There were no vitals filed for this visit.   Subjective Assessment - 02/06/20 1304    Subjective COVID-19 screening performed upon arrival. Patient reports doing well, 2-3/10 Bil shldrs Just sore B/W shldr blades after last Rx    Pertinent History lumpectomy, THA, bilateral CTS, COPD.    Patient Stated Goals Use UE's  without pain.    Currently in Pain? Yes    Pain Score 3     Pain Location Shoulder    Pain Orientation Right;Left    Pain Descriptors / Indicators Aching;Sore    Pain Type Chronic pain    Pain Onset More than a month ago                             [facility] Adult PT Treatment/Exercise - 02/06/20 0001      Shoulder Exercises: Standing   Protraction Both;20 reps    Theraband Level (Shoulder Protraction) Level 1 (Yellow)    External Rotation Both;20 reps;Theraband;Strengthening    Theraband Level (Shoulder External Rotation) Level 1 (Yellow)    Internal Rotation Strengthening;Both;20 reps;Theraband    Theraband Level (Shoulder Internal Rotation) Level 1 (Yellow)    Row Strengthening;20 reps;Theraband    Theraband Level (Shoulder Row) Level 1 (Yellow)    Other Standing Exercises Hor. ABD AROM 3x10, attempted with yellow tband, but challenging      Shoulder Exercises: Pulleys   Flexion 5 minutes      Shoulder Exercises: ROM/Strengthening   UBE (Upper Arm Bike) 90  RPM x8 min (forward/backward)      Shoulder Exercises: Stretch   Corner Stretch 5 reps;30 seconds  Door way at different angles   Both shldrs                      PT Long Term Goals - 01/21/20 1539      PT LONG TERM GOAL #1   Title Independent with a HEP.    Time 6    Period Weeks    Status Achieved      PT LONG TERM GOAL #2   Title Bilateral active shoulder flexion to 155 degrees so the patient can easily reach overhead.    Time 6    Period Weeks    Status On-going      PT LONG TERM GOAL #3   Title Active right ER to 80 degrees+ to allow for easily donning/doffing of apparel.    Time 6    Period Weeks    Status On-going      PT LONG TERM GOAL #4   Title Increase ROM so patient is able to reach behind back bilaterally to L2.    Time 6    Period Weeks    Status On-going      PT LONG TERM GOAL #5   Title Perform ADL's with pain not > 3/10.    Time 6    Period Weeks     Status On-going                 Plan - 02/06/20 1407    Clinical Impression Statement Pt arrived today a little sore after RW 4 exs last Rx, but did well with them today and was issued for HEP with tband and handout. PT  was also instrudted in doorway stretches for Bil shldr ROM    Personal Factors and Comorbidities Comorbidity 1;Comorbidity 2;Other    Comorbidities Lumpectomy, THA, bilateral CTS, COPD.    Examination-Activity Limitations Other;Reach Overhead    Rehab Potential Excellent    PT Frequency 2x / week    PT Treatment/Interventions ADLs/Self Care Home Management;Cryotherapy;Electrical Stimulation;Moist Heat;Therapeutic activities;Therapeutic exercise;Manual techniques;Passive range of motion;Vasopneumatic Device    PT Next Visit Plan cont with POC for pain-free pulleys and/or UE Ranger, wall ladder, gentle towel stretch, RTC and scapular strengthening exercises. (monitor SOB due to COPD)    Consulted and Agree with Plan of Care Patient           Patient will benefit from skilled therapeutic intervention in order to improve the following deficits and impairments:  Pain,Decreased activity tolerance,Decreased range of motion  Visit Diagnosis: Chronic right shoulder pain  Chronic left shoulder pain  Aftercare following surgery for neoplasm  Status post left breast lumpectomy  Abnormal posture     Problem List Patient Active Problem List   Diagnosis Date Noted  . Osteoporosis 08/09/2018  . Neuropathy 02/18/2018  . Malignant neoplasm of upper-outer quadrant of left breast in female, estrogen receptor positive ([place]) 01/16/2018  . Total knee replacement status, right 06/27/2017  . Primary osteoarthritis of right knee 02/15/2017  . Primary osteoarthritis of left knee 02/15/2017  . 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 Cephalalgia 02/15/2017  . Chronic obstructive pulmonary disease ([place]) 11/08/2016  . Pure hypercholesterolemia 11/08/2016  .  Hypoxia 11/08/2016  . Tobacco abuse 11/08/2016    Caoilainn Sacks,[name], PTA 02/06/2020, 2:10 PM  [facility] [address] [place], [place], [address] Phone: [phone]   Fax:  [phone]  Name: Joanne Kramer MRN: [id number] Date of Birth: May 17, 1945

## 2020-02-09 IMAGING — MG DIGITAL DIAGNOSTIC BILATERAL MAMMOGRAM WITH TOMO AND CAD
7 of 15 series · 7 of 40 positions shown · non-contrast
Comparison: Mammography 10/25/2016, 08/13/2014.

CLINICAL DATA: One year interval follow-up of likely benign
calcifications involving the RIGHT breast. The patient presented in
September 2016 with a palpable lump in the UPPER OUTER LEFT breast
which is a benign mass that has been stable dating back to at least
1992 (per last year's mammogram report). Annual evaluation, LEFT
breast.

EXAM:
DIGITAL DIAGNOSTIC BILATERAL MAMMOGRAM WITH CAD AND TOMO
ULTRASOUND LEFT BREAST

[R CC]
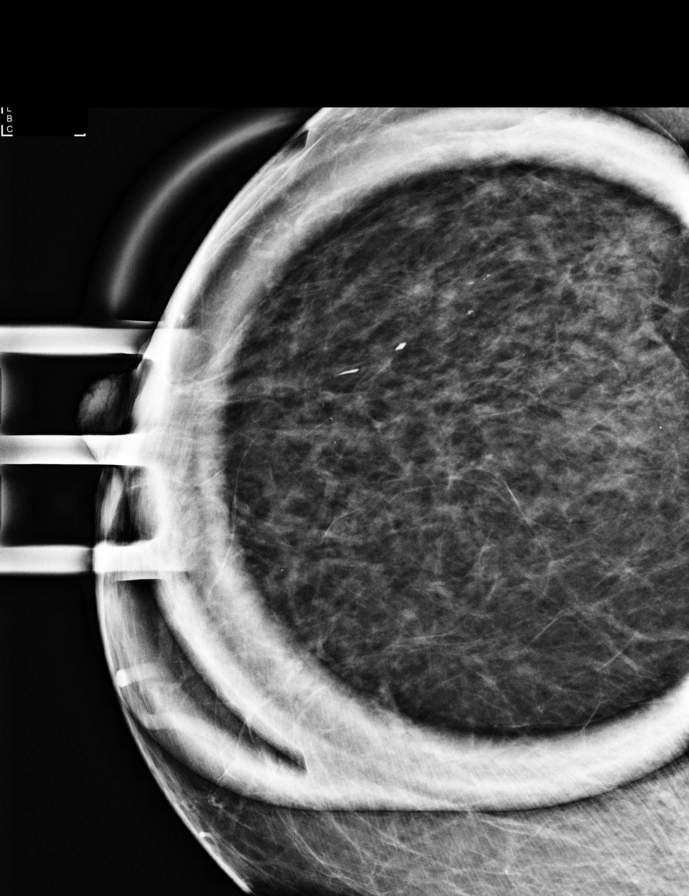

[R ML]
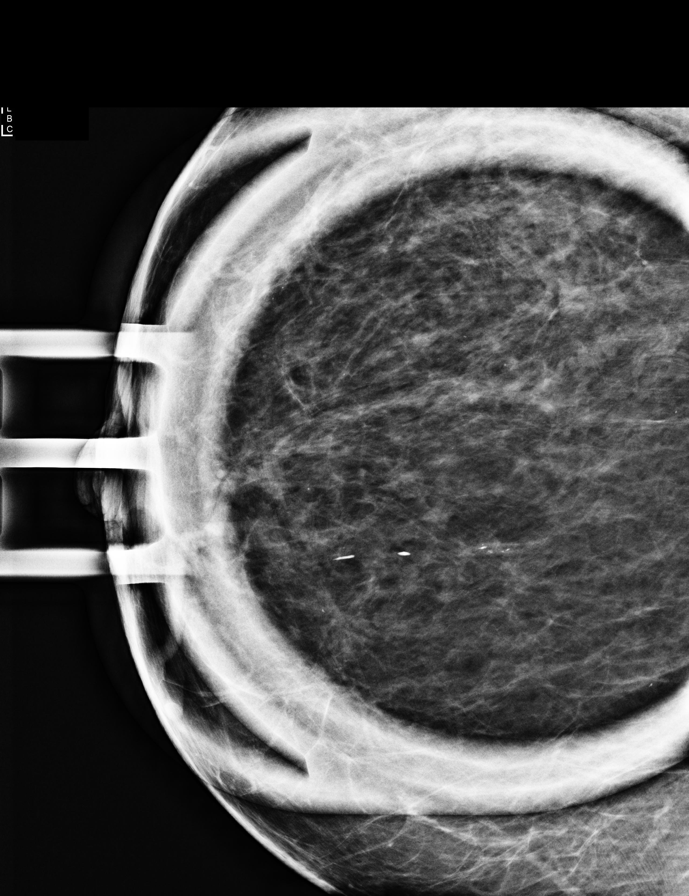

[R CC synth-2D]
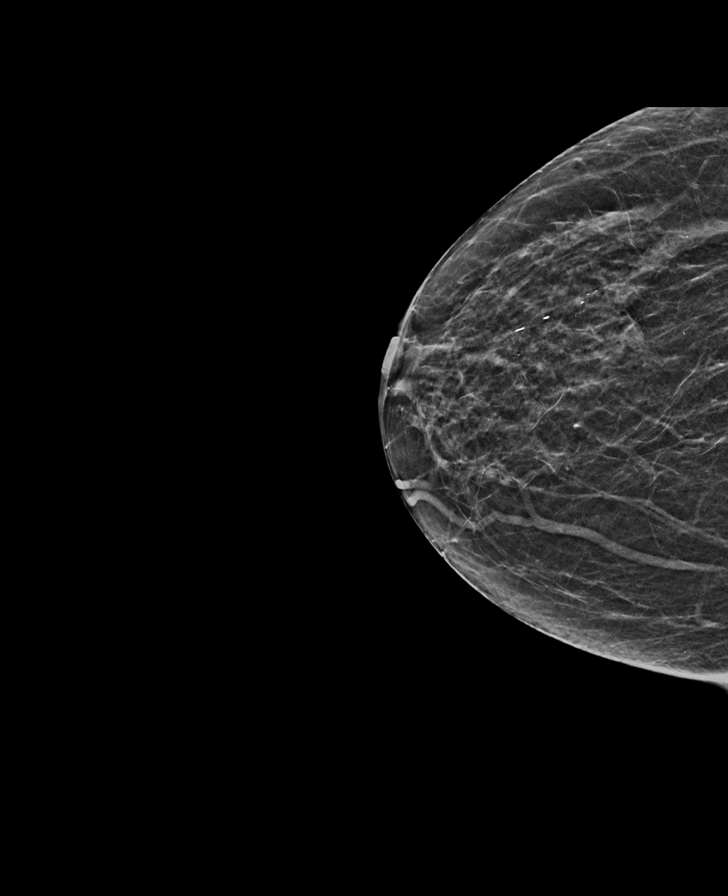

[L MLO synth-2D]
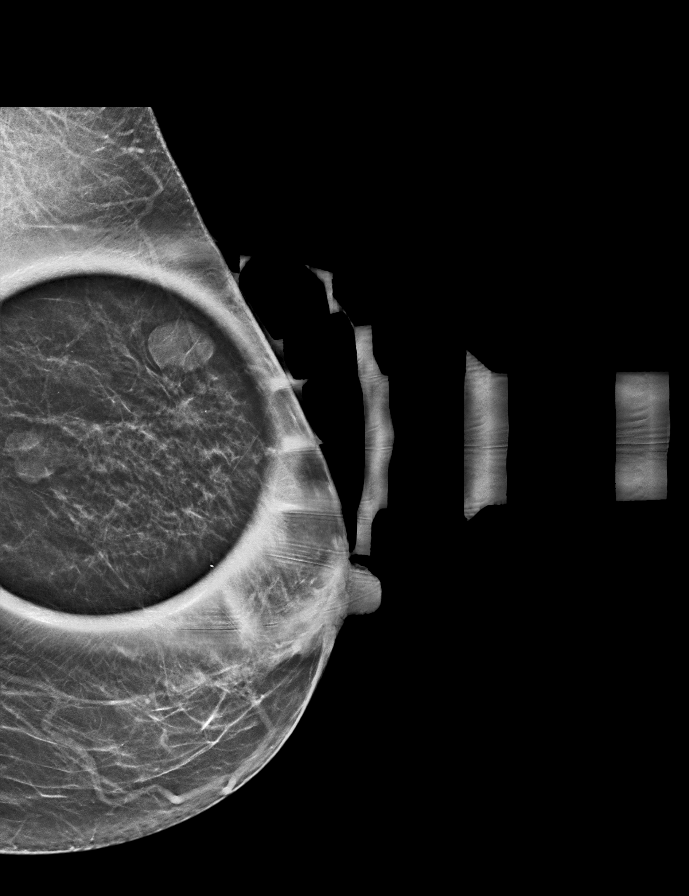

[L CC synth-2D (1 of 2)]
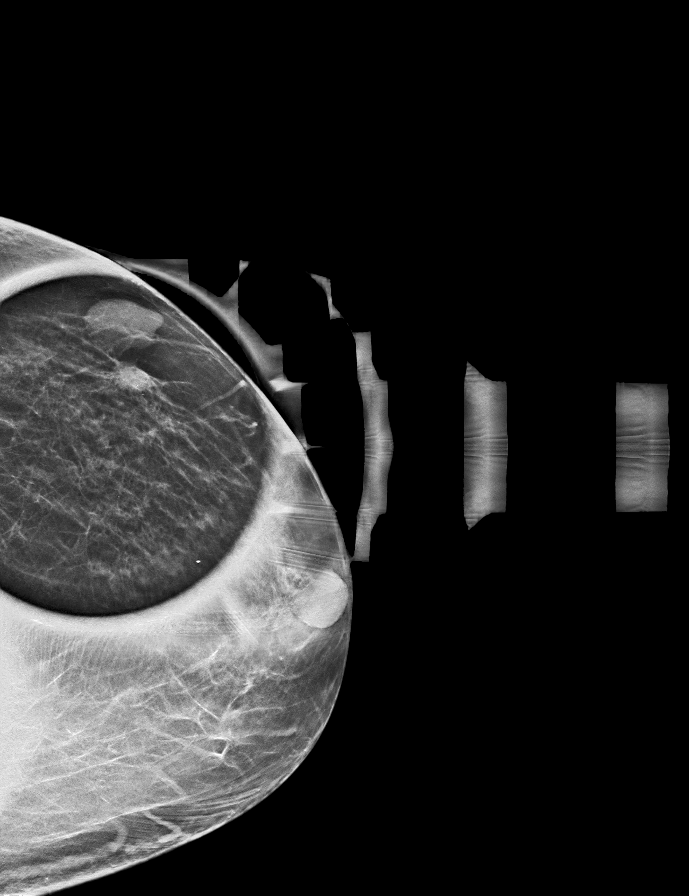

[R MLO synth-2D]
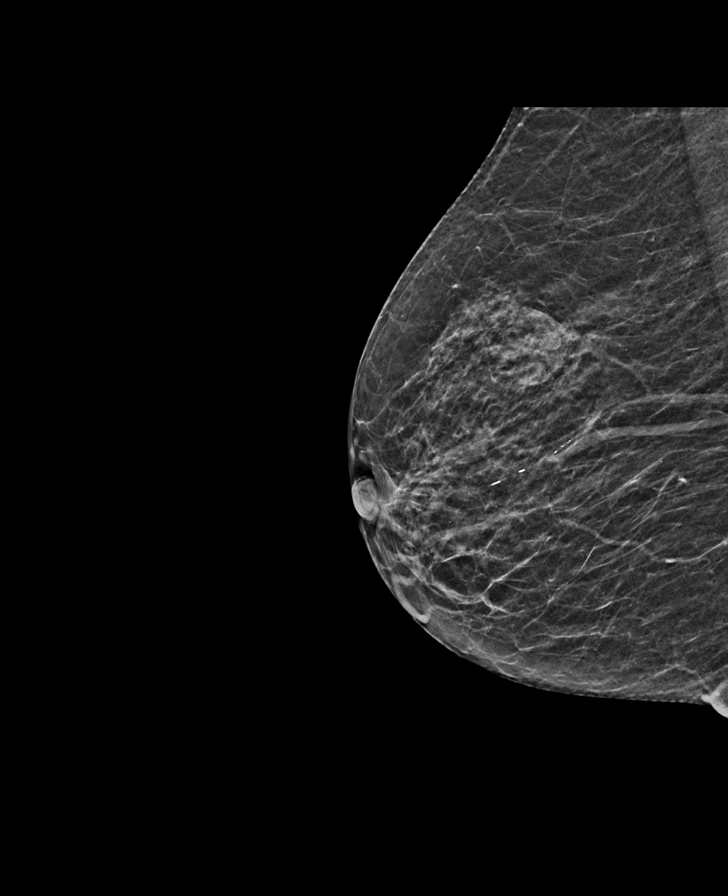

[L CC synth-2D (2 of 2)]
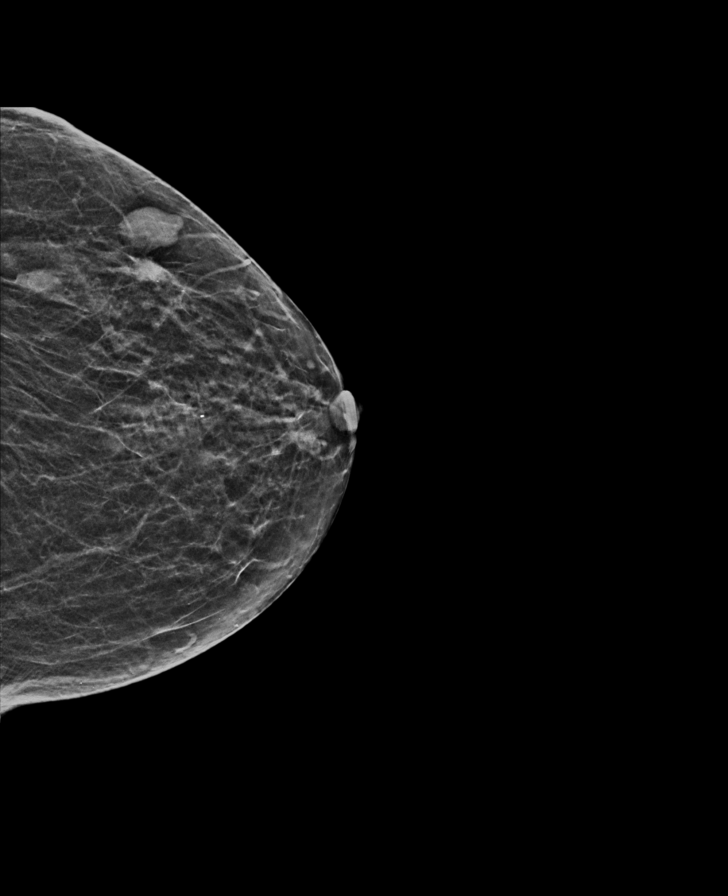

[7 of 40 positions shown; findings below may reference images not displayed]

No prior LEFT
breast ultrasound.

ACR Breast Density Category b: There are scattered areas of
fibroglandular density.
FINDINGS: Tomosynthesis and synthesized full field CC and MLO views of both
breasts were obtained. Standard spot magnification CC and
mediolateral views of the RIGHT breast calcifications were obtained.
Tomosynthesis and synthesized spot-compression CC and MLO views of a
mammographic finding in the LEFT breast were also obtained.

In the UPPER OUTER QUADRANT of the LEFT breast at MIDDLE depth, just
MEDIAL and INFERIOR to the benign mass identified on multiple prior
mammograms, is a medium density mass with spiculation measuring
approximately 8-9 mm. There are faint calcifications associated with
this mass. The mass is best seen on the tomosynthesis spot
compression CC images.

The previously identified circumscribed low-density masses in the
UPPER OUTER QUADRANT at MIDDLE depth and the OUTER breast at
POSTERIOR depth are unchanged.

The previously identified calcifications in the RIGHT breast have a
benign appearance. The previously identified calcifications in the
LOWER OUTER breast at ANTERIOR depth are coarse and linear in
morphology, indicating benign secretory calcifications. There are
scattered benign appearing punctate calcifications elsewhere in the
retroareolar RIGHT breast. There are no new suspicious linear or
branching forms.

No new or suspicious findings in the RIGHT breast.

Mammographic images were processed with CAD.

On physical exam, the superficial mass in the UPPER OUTER QUADRANT
of the LEFT breast corresponding to the longstanding benign mass is
palpable, though I do not palpate other masses.

Targeted LEFT breast ultrasound is performed, showing the oval
circumscribed parallel anechoic mass superficially at the 2 o'clock
position approximately 4 cm from nipple, measuring approximately
x 1.4 x 1.8 cm, corresponding to the longstanding benign mass on
mammography. Immediately adjacent to this cyst is a hypoechoic mass
with indistinct and irregular margins measuring approximately blank,
demonstrating blank and demonstrating no internal color doppler
flow, corresponding to the mammographic mass.

Sonographic evaluation of the LEFT axilla demonstrates a solitary
lymph node with mild cortical thickening up to 4 mm.
IMPRESSION: 1. Suspicious 9 mm mass involving the UPPER OUTER QUADRANT of the
LEFT breast, immediately adjacent to a longstanding benign LEFT
breast cyst.
2. Solitary borderline LEFT axillary lymph node with a cortical
thickness of 4 mm.
3. Stable likely benign RIGHT breast calcifications dating back to
September 2016.

RECOMMENDATION:
1. Ultrasound-guided core needle biopsy of the suspicious LEFT
breast mass and the borderline LEFT axillary lymph node.
2. Diagnostic mammography in 1 year with spot magnification views of
the RIGHT breast calcifications at that time.

The ultrasound core needle biopsy procedure was discussed with
patient and her questions were answered. She has agreed to proceed
and the biopsy has been scheduled for [REDACTED], [DATE] at 1
o'clock p.m..

I have discussed the findings and recommendations with the patient.
Results were also provided in writing at the conclusion of the
visit. If applicable, a reminder letter will be sent to the patient
regarding the next appointment.

BI-RADS CATEGORY  4: Suspicious.

## 2020-02-10 DIAGNOSIS — J449 Chronic obstructive pulmonary disease, unspecified: Secondary | ICD-10-CM | POA: Diagnosis not present

## 2020-02-11 ENCOUNTER — Ambulatory Visit: Payer: Medicare Other | Admitting: Physical Therapy

## 2020-02-11 ENCOUNTER — Other Ambulatory Visit: Payer: Self-pay

## 2020-02-11 ENCOUNTER — Encounter: Payer: Self-pay | Admitting: Physical Therapy

## 2020-02-11 DIAGNOSIS — M25512 Pain in left shoulder: Secondary | ICD-10-CM | POA: Diagnosis not present

## 2020-02-11 DIAGNOSIS — Z9889 Other specified postprocedural states: Secondary | ICD-10-CM | POA: Diagnosis not present

## 2020-02-11 DIAGNOSIS — G8929 Other chronic pain: Secondary | ICD-10-CM | POA: Diagnosis not present

## 2020-02-11 DIAGNOSIS — R293 Abnormal posture: Secondary | ICD-10-CM | POA: Diagnosis not present

## 2020-02-11 DIAGNOSIS — M25511 Pain in right shoulder: Secondary | ICD-10-CM | POA: Diagnosis not present

## 2020-02-11 DIAGNOSIS — Z483 Aftercare following surgery for neoplasm: Secondary | ICD-10-CM | POA: Diagnosis not present

## 2020-02-11 NOTE — Therapy (Signed)
[facility] [place], [place], [address] Phone: [phone]   Fax:  [phone]  Physical Therapy Treatment  Patient Details  Name: Joanne Kramer MRN: [id number] Date of Birth: 08/17/1945 Referring Provider (PT): Claretta Fraise MD   Encounter Date: 02/11/2020   PT End of Session - 02/11/20 1305    Visit Number 7    Number of Visits 12    Date for PT Re-Evaluation 02/25/20    PT Start Time 1301    PT Stop Time 1345    PT Time Calculation (min) 44 min    Activity Tolerance Patient tolerated treatment well    Behavior During Therapy [facility] for tasks assessed/performed           Past Medical History:  Diagnosis Date  . Breast cancer ([place])   . Contact dermatitis    chronic itching but skin biopsy did not show anything specific  . COPD (chronic obstructive pulmonary disease) ([place])   . Emphysema of lung ([place])   . Headache   . 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 OA (osteoarthritis) of knee    right  . Pneumonia   . Requires supplemental oxygen     Past Surgical History:  Procedure Laterality Date  . BREAST LUMPECTOMY WITH RADIOACTIVE SEED AND SENTINEL LYMPH NODE BIOPSY Left 03/05/2018   Procedure: LEFT BREAST LUMPECTOMY WITH RADIOACTIVE SEED AND LEFT AXILLARY SENTINEL LYMPH NODE BIOPSY;  Surgeon: Rolm Bookbinder, MD;  Location: [place];  Service: General;  Laterality: Left;  . BREAST SURGERY     left breast aspiration  . CESAREAN SECTION    . COLONOSCOPY    . EYE SURGERY    . KNEE SURGERY Right   . TONSILLECTOMY    . TOTAL KNEE ARTHROPLASTY Right 06/27/2017   Procedure: RIGHT TOTAL KNEE ARTHROPLASTY;  Surgeon: Garald Balding, MD;  Location: [place];  Service: Orthopedics;  Laterality: Right;    There were no vitals filed for this visit.   Subjective Assessment - 02/11/20 1304    Subjective COVID-19 screening performed upon arrival. Patient reports feeling a little sore in both shoulders 3/10.     Pertinent History lumpectomy, THA, bilateral CTS, COPD.    Patient Stated Goals Use UE's without pain.    Currently in Pain? Yes    Pain Score 3     Pain Location Shoulder    Pain Orientation Right;Left    Pain Descriptors / Indicators Aching;Sore    Pain Type Chronic pain    Pain Onset More than a month ago    Pain Frequency Constant              [facility] PT Assessment - 02/11/20 0001      Assessment   Medical Diagnosis Bilateral shoulder pain.    Referring Provider (PT) Claretta Fraise MD    Onset Date/Surgical Date 03/05/18    Next MD Visit no appointment      Precautions   Precaution Comments Breast cancer (currently cancer-free).      Restrictions   Weight Bearing Restrictions No                         [facility] Adult PT Treatment/Exercise - 02/11/20 0001      Shoulder Exercises: Standing   Protraction Both;20 reps    Theraband Level (Shoulder Protraction) Level 1 (Yellow)    External Rotation Both;20 reps;Theraband;Strengthening    Theraband Level (Shoulder External Rotation) Level 1 (Yellow)  Internal Rotation Strengthening;Both;20 reps;Theraband    Theraband Level (Shoulder Internal Rotation) Level 1 (Yellow)    Extension Strengthening;Both;20 reps;Theraband    Theraband Level (Shoulder Extension) Level 1 (Yellow)    Row Strengthening;20 reps;Theraband    Theraband Level (Shoulder Row) Level 1 (Yellow)      Shoulder Exercises: Pulleys   Flexion 5 minutes      Shoulder Exercises: ROM/Strengthening   UBE (Upper Arm Bike) 90  RPM x8 min (forward/backward)      Shoulder Exercises: Stretch   Corner Stretch 4 reps   15  seconds   Cross Chest Stretch 4 reps   15 second hold                      PT Long Term Goals - 01/21/20 1539      PT LONG TERM GOAL #1   Title Independent with a HEP.    Time 6    Period Weeks    Status Achieved      PT LONG TERM GOAL #2   Title Bilateral active shoulder flexion to 155 degrees so the patient  can easily reach overhead.    Time 6    Period Weeks    Status On-going      PT LONG TERM GOAL #3   Title Active right ER to 80 degrees+ to allow for easily donning/doffing of apparel.    Time 6    Period Weeks    Status On-going      PT LONG TERM GOAL #4   Title Increase ROM so patient is able to reach behind back bilaterally to L2.    Time 6    Period Weeks    Status On-going      PT LONG TERM GOAL #5   Title Perform ADL's with pain not > 3/10.    Time 6    Period Weeks    Status On-going                 Plan - 02/11/20 1340    Clinical Impression Statement Patient responded fairly well to therapy session though with fatigue with wall washes. Patient demonstrate good form with all TEs after explanation. Patient reports some improvement with PT but pain is sporadic and can increase/decrease at any time. Patient and PT discussed reassessing at visit 10 to determine whether to continue remaining visits or be placed on hold for further assessment from referring physician. Patient reported understanding.    Personal Factors and Comorbidities Comorbidity 1;Comorbidity 2;Other    Comorbidities Lumpectomy, THA, bilateral CTS, COPD.    Examination-Activity Limitations Other;Reach Overhead    Examination-Participation Restrictions Other    Stability/Clinical Decision Making Stable/Uncomplicated    Clinical Decision Making Low    Rehab Potential Excellent    PT Frequency 2x / week    PT Duration 6 weeks    PT Treatment/Interventions ADLs/Self Care Home Management;Cryotherapy;Electrical Stimulation;Moist Heat;Therapeutic activities;Therapeutic exercise;Manual techniques;Passive range of motion;Vasopneumatic Device    PT Next Visit Plan cont with POC for pain-free pulleys and/or UE Ranger, wall ladder, gentle towel stretch, RTC and scapular strengthening exercises. (monitor SOB due to COPD)    Consulted and Agree with Plan of Care Patient           Patient will benefit from  skilled therapeutic intervention in order to improve the following deficits and impairments:  Pain,Decreased activity tolerance,Decreased range of motion  Visit Diagnosis: Chronic right shoulder pain  Chronic left shoulder pain  Problem List Patient Active Problem List   Diagnosis Date Noted  . Osteoporosis 08/09/2018  . Neuropathy 02/18/2018  . Malignant neoplasm of upper-outer quadrant of left breast in female, estrogen receptor positive ([place]) 01/16/2018  . Total knee replacement status, right 06/27/2017  . Primary osteoarthritis of right knee 02/15/2017  . Primary osteoarthritis of left knee 02/15/2017  . 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 Cephalalgia 02/15/2017  . Chronic obstructive pulmonary disease ([place]) 11/08/2016  . Pure hypercholesterolemia 11/08/2016  . Hypoxia 11/08/2016  . Tobacco abuse 11/08/2016    Gabriela Eves, PT, DPT 02/11/2020, 2:22 PM  [facility] [address] [place], [place], [address] Phone: [phone]   Fax:  [phone]  Name: Joanne Kramer MRN: [id number] Date of Birth: 04/21/1945

## 2020-02-12 ENCOUNTER — Telehealth: Payer: Self-pay | Admitting: Pulmonary Disease

## 2020-02-12 DIAGNOSIS — J9611 Chronic respiratory failure with hypoxia: Secondary | ICD-10-CM

## 2020-02-12 NOTE — Telephone Encounter (Signed)
ATC patient unable to reach LM to call back office (x1)  Patient walked on continuous O2 in June 2021 I dont see a walk on pulsed oxygen. Just need to clarify DME [employer]

## 2020-02-13 ENCOUNTER — Other Ambulatory Visit: Payer: Self-pay

## 2020-02-13 ENCOUNTER — Ambulatory Visit: Payer: Medicare Other | Admitting: Physical Therapy

## 2020-02-13 DIAGNOSIS — R293 Abnormal posture: Secondary | ICD-10-CM | POA: Diagnosis not present

## 2020-02-13 DIAGNOSIS — M25511 Pain in right shoulder: Secondary | ICD-10-CM | POA: Diagnosis not present

## 2020-02-13 DIAGNOSIS — Z483 Aftercare following surgery for neoplasm: Secondary | ICD-10-CM | POA: Diagnosis not present

## 2020-02-13 DIAGNOSIS — G8929 Other chronic pain: Secondary | ICD-10-CM

## 2020-02-13 DIAGNOSIS — M25512 Pain in left shoulder: Secondary | ICD-10-CM | POA: Diagnosis not present

## 2020-02-13 DIAGNOSIS — Z9889 Other specified postprocedural states: Secondary | ICD-10-CM | POA: Diagnosis not present

## 2020-02-13 NOTE — Therapy (Addendum)
[facility] [place], [place], [address] Phone: [phone]   Fax:  [phone]  Physical Therapy Treatment PHYSICAL THERAPY DISCHARGE SUMMARY  Visits from Start of Care: 8  Current functional level related to goals / functional outcomes: See below   Remaining deficits: See goals   Education / Equipment: HEP Plan: Patient agrees to discharge.  Patient goals were partially met. Patient is being discharged due to not returning since the last visit.  ?????  Gabriela Eves, PT, DPT 05/01/20    Patient Details  Name: Joanne Kramer MRN: [id number] Date of Birth: [birth year] Referring Provider (PT): Claretta Fraise MD   Encounter Date: 02/13/2020   PT End of Session - 02/13/20 1305    Visit Number 8    Number of Visits 12    Date for PT Re-Evaluation 02/25/20    PT Start Time 0101    PT Stop Time 0144    PT Time Calculation (min) 43 min    Activity Tolerance Patient tolerated treatment well    Behavior During Therapy [facility] for tasks assessed/performed           Past Medical History:  Diagnosis Date  . Breast cancer ([place])   . Contact dermatitis    chronic itching but skin biopsy did not show anything specific  . COPD (chronic obstructive pulmonary disease) ([place])   . Emphysema of lung ([place])   . Headache   . 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 OA (osteoarthritis) of knee    right  . Pneumonia   . Requires supplemental oxygen     Past Surgical History:  Procedure Laterality Date  . BREAST LUMPECTOMY WITH RADIOACTIVE SEED AND SENTINEL LYMPH NODE BIOPSY Left 03/05/2018   Procedure: LEFT BREAST LUMPECTOMY WITH RADIOACTIVE SEED AND LEFT AXILLARY SENTINEL LYMPH NODE BIOPSY;  Surgeon: Rolm Bookbinder, MD;  Location: [facility];  Service: General;  Laterality: Left;  . BREAST SURGERY     left breast aspiration  . CESAREAN SECTION    . COLONOSCOPY    . EYE SURGERY    . KNEE SURGERY Right   .  TONSILLECTOMY    . TOTAL KNEE ARTHROPLASTY Right 06/27/2017   Procedure: RIGHT TOTAL KNEE ARTHROPLASTY;  Surgeon: Garald Balding, MD;  Location: [facility];  Service: Orthopedics;  Laterality: Right;    There were no vitals filed for this visit.   Subjective Assessment - 02/13/20 1304    Subjective COVID-19 screening performed upon arrival. Patient reports doing well with some ongoing soreness    Pertinent History lumpectomy, THA, bilateral CTS, COPD.    Patient Stated Goals Use UE's without pain.    Currently in Pain? Yes    Pain Score 3     Pain Location Shoulder    Pain Orientation Right;Left    Pain Descriptors / Indicators Discomfort;Sore    Pain Type Chronic pain    Pain Onset More than a month ago    Pain Frequency Intermittent    Aggravating Factors  prolong use of shoulder    Pain Relieving Factors at rest              [facility] PT Assessment - 02/13/20 0001      AROM   Right Shoulder Flexion 140 Degrees    Right Shoulder Internal Rotation --   L1   Left Shoulder Flexion 130 Degrees    Left Shoulder Internal Rotation --   T12  [place] Adult PT Treatment/Exercise - 02/13/20 0001      Shoulder Exercises: Seated   Diagonals Strengthening;Both;Weights   x10 each way   Other Seated Exercises seated scaption bil 3x10      Shoulder Exercises: Standing   External Rotation Both;20 reps;Theraband;Strengthening    Theraband Level (Shoulder External Rotation) Level 1 (Yellow)    Internal Rotation Strengthening;Both;20 reps;Theraband    Theraband Level (Shoulder Internal Rotation) Level 2 (Red)    ABduction Strengthening;Both;Theraband   2x10 with 90degree overhead   Theraband Level (Shoulder ABduction) Level 1 (Yellow)    Row Strengthening;20 reps;Theraband    Theraband Level (Shoulder Row) Level 2 (Red)    Other Standing Exercises standing red swiss ball walk up and eccentric lowering 2x5      Shoulder Exercises: Pulleys   Flexion 5  minutes      Shoulder Exercises: ROM/Strengthening   UBE (Upper Arm Bike) 90  RPM x8 min (forward/backward)                       PT Long Term Goals - 02/13/20 1306      PT LONG TERM GOAL #1   Title Independent with a HEP.    Time 6    Period Weeks    Status Achieved      PT LONG TERM GOAL #2   Title Bilateral active shoulder flexion to 155 degrees so the patient can easily reach overhead.    Baseline AROM 130 LT / 140 RT 02/13/20    Time 6    Period Weeks    Status On-going      PT LONG TERM GOAL #3   Title Active right ER to 80 degrees+ to allow for easily donning/doffing of apparel.    Time 6    Period Weeks    Status On-going      PT LONG TERM GOAL #4   Title Increase ROM so patient is able to reach behind back bilaterally to L2.    Baseline AROM T12 LT / L1 RT 02/13/20    Time 6    Period Weeks    Status Achieved      PT LONG TERM GOAL #5   Title Perform ADL's with pain not > 3/10.    Baseline 3/10 02/13/20    Time 6    Period Weeks    Status Achieved                 Plan - 02/13/20 1312    Clinical Impression Statement Patient tolerated treatment well today. Patient has improved ROM in Bil shoulders today. Patient has reported 3/10 pain with ADL's and feels overall improvement. Patient continues to have some discomfort and lack of ROM due to weakness due to limitations. Patient progressing with PRE's this week. Met LTG #4 and #5 today with others progressing.    Personal Factors and Comorbidities Comorbidity 1;Comorbidity 2;Other    Comorbidities Lumpectomy, THA, bilateral CTS, COPD.    Examination-Activity Limitations Other;Reach Overhead    Examination-Participation Restrictions Other    Stability/Clinical Decision Making Stable/Uncomplicated    Rehab Potential Excellent    PT Frequency 2x / week    PT Duration 6 weeks    PT Treatment/Interventions ADLs/Self Care Home Management;Cryotherapy;Electrical Stimulation;Moist Heat;Therapeutic  activities;Therapeutic exercise;Manual techniques;Passive range of motion;Vasopneumatic Device    PT Next Visit Plan cont with POC for pain-free ROM, RTC and scapular strengthening exercises. (monitor SOB due to COPD)    Consulted and Agree with  Plan of Care Patient           Patient will benefit from skilled therapeutic intervention in order to improve the following deficits and impairments:  Pain,Decreased activity tolerance,Decreased range of motion  Visit Diagnosis: Chronic right shoulder pain  Chronic left shoulder pain     Problem List Patient Active Problem List   Diagnosis Date Noted  . Osteoporosis 08/09/2018  . Neuropathy 02/18/2018  . Malignant neoplasm of upper-outer quadrant of left breast in female, estrogen receptor positive ([place]) 01/16/2018  . Total knee replacement status, right 06/27/2017  . Primary osteoarthritis of right knee 02/15/2017  . Primary osteoarthritis of left knee 02/15/2017  . 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 Cephalalgia 02/15/2017  . Chronic obstructive pulmonary disease ([place]) 11/08/2016  . Pure hypercholesterolemia 11/08/2016  . Hypoxia 11/08/2016  . Tobacco abuse 11/08/2016    Chalisa Kobler P, PTA 02/13/2020, 1:50 PM  [facility] [address] [place], [place], [address] Phone: [phone]   Fax:  [phone]  Name: Joanne Kramer MRN: [id number] Date of Birth: 04/29/1945

## 2020-02-14 NOTE — Telephone Encounter (Signed)
Okay to send order to [facility] to change to continuous flow oxygen at 2 liters with exertion and sleep.

## 2020-02-14 NOTE — Telephone Encounter (Signed)
Spoke with patient. She is aware that [facility] is ok with changing the order. Will go ahead and place the order. Nothing further needed at time of call.

## 2020-02-14 NOTE — Telephone Encounter (Signed)
Spoke with the pt  She states that she has POC that only does pulsed o2 She is requesting that we send order to [facility] for continuous flow only  She states that the alarm goes off on her POC b/c she is not breathing in through her nose  Please advise thanks!

## 2020-02-18 ENCOUNTER — Telehealth: Payer: Self-pay | Admitting: Pulmonary Disease

## 2020-02-18 ENCOUNTER — Ambulatory Visit: Payer: Medicare Other | Admitting: Physical Therapy

## 2020-02-18 NOTE — Telephone Encounter (Signed)
Called and spoke with Bethanne Ginger who states that [employer] [facility] has to added to the order that was placed for POC. Order has been addended. Nothing further needed at this time.

## 2020-02-18 NOTE — Addendum Note (Signed)
Addended by: Lia Foyer R on: 02/18/2020 03:26 PM   Modules accepted: Orders

## 2020-02-20 ENCOUNTER — Encounter: Payer: Medicare Other | Admitting: Physical Therapy

## 2020-02-27 ENCOUNTER — Telehealth: Payer: Self-pay | Admitting: *Deleted

## 2020-02-27 ENCOUNTER — Other Ambulatory Visit: Payer: Self-pay

## 2020-02-27 ENCOUNTER — Encounter: Payer: Self-pay | Admitting: *Deleted

## 2020-02-27 ENCOUNTER — Encounter: Payer: Self-pay | Admitting: Internal Medicine

## 2020-02-27 ENCOUNTER — Ambulatory Visit: Payer: Medicare Other | Admitting: Internal Medicine

## 2020-02-27 VITALS — BP 125/79 | HR 85 | Temp 97.3°F | Ht 64.0 in | Wt 172.2 lb

## 2020-02-27 DIAGNOSIS — Z791 Long term (current) use of non-steroidal anti-inflammatories (NSAID): Secondary | ICD-10-CM

## 2020-02-27 DIAGNOSIS — Z1211 Encounter for screening for malignant neoplasm of colon: Secondary | ICD-10-CM | POA: Diagnosis not present

## 2020-02-27 DIAGNOSIS — R1312 Dysphagia, oropharyngeal phase: Secondary | ICD-10-CM

## 2020-02-27 DIAGNOSIS — R1310 Dysphagia, unspecified: Secondary | ICD-10-CM | POA: Insufficient documentation

## 2020-02-27 DIAGNOSIS — Z720 Tobacco use: Secondary | ICD-10-CM | POA: Diagnosis not present

## 2020-02-27 NOTE — H&P (View-Only) (Signed)
Primary Care Physician:  Claretta Fraise, MD Primary Gastroenterologist:  Dr. Abbey Chatters  Chief Complaint  Patient presents with  . Dysphagia    Pills get stuck in throat. Feels like pocket in throat    HPI:   Joanne Kramer is a 74 y.o. female who presents to the clinic today by referral from her PCP Dr. Livia Snellen for dysphagia.  Patient states her symptoms started approximately 6 months ago and have progressively worsening.  States is primarily oral pharyngeal with pills.  States they will "get stuck" at times.  Sometimes has issues with food, never fluids.  Is on chronic NSAIDs for joint pains.  No history of peptic ulcer disease or H. pylori that she knows of.  No epigastric pain.  No chronic reflux or heartburn.  In regards to colon cancer screening, she states that last colonoscopy was greater than 10 years ago.  No family history of colorectal malignancy.  No abdominal pain or unintentional weight loss.  No melena hematochezia.  Past Medical History:  Diagnosis Date  . Breast cancer ([place])   . Contact dermatitis    chronic itching but skin biopsy did not show anything specific  . COPD (chronic obstructive pulmonary disease) ([place])   . Emphysema of lung ([place])   . Headache   . 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 OA (osteoarthritis) of knee    right  . Pneumonia   . Requires supplemental oxygen     Past Surgical History:  Procedure Laterality Date  . BREAST LUMPECTOMY WITH RADIOACTIVE SEED AND SENTINEL LYMPH NODE BIOPSY Left 03/05/2018   Procedure: LEFT BREAST LUMPECTOMY WITH RADIOACTIVE SEED AND LEFT AXILLARY SENTINEL LYMPH NODE BIOPSY;  Surgeon: Rolm Bookbinder, MD;  Location: [facility];  Service: General;  Laterality: Left;  . BREAST SURGERY     left breast aspiration  . CESAREAN SECTION    . COLONOSCOPY    . EYE SURGERY    . KNEE SURGERY Right   . TONSILLECTOMY    . TOTAL KNEE ARTHROPLASTY Right 06/27/2017   Procedure: RIGHT TOTAL KNEE ARTHROPLASTY;  Surgeon:  Garald Balding, MD;  Location: [facility];  Service: Orthopedics;  Laterality: Right;    Current Outpatient Medications  Medication Sig Dispense Refill  . Budeson-Glycopyrrol-Formoterol (BREZTRI AEROSPHERE) 160-9-4.8 MCG/ACT AERO Inhale 2 puffs into the lungs 2 (two) times daily. 10.7 g 5  . Budeson-Glycopyrrol-Formoterol (BREZTRI AEROSPHERE) 160-9-4.8 MCG/ACT AERO Inhale 2 puffs into the lungs in the morning and at bedtime. 10.7 g 0  . Calcium Carb-Cholecalciferol (CALCIUM 600+D3 PO) Take 1 tablet by mouth daily.    . calcium carbonate (TUMS EX) 750 MG chewable tablet Chew 1-2 tablets by mouth 3 (three) times daily as needed (for heartburn/indigestion.).     Marland Kitchen clotrimazole (MYCELEX) 10 MG troche as needed.    . Cyanocobalamin (VITAMIN B12 PO) Take by mouth daily.    . Ipratropium-Albuterol (COMBIVENT RESPIMAT) 20-100 MCG/ACT AERS respimat INHALE 1 PUFF INTO THE LUNGS EVERY 4 (FOUR) HOURS AS NEEDED (USE 4 TIMES DAILY) 4 g 2  . Melatonin 5 MG TABS Take 5 mg by mouth at bedtime as needed (for sleep.).     Marland Kitchen Multiple Vitamin (MULTIVITAMIN WITH MINERALS) TABS tablet Take 1 tablet by mouth daily.    . naproxen sodium (ALEVE) 220 MG tablet Take 440 mg by mouth daily as needed (pain).    . OXYGEN Inhale 2 L into the lungs at bedtime. Bedtime and as needed    . triamcinolone cream (  KENALOG) 0.1 % Apply 1 application topically 2 (two) times daily as needed (for skin irritation/contact dermatitis). 453.6 g 1  . guaiFENesin (MUCINEX) 600 MG 12 hr tablet Take 2 tablets (1,200 mg total) by mouth 2 (two) times daily as needed for cough or to loosen phlegm. (Patient not taking: No sig reported)     No current facility-administered medications for this visit.    Allergies as of 02/27/2020 - Review Complete 02/27/2020  Allergen Reaction Noted  . Prolia [denosumab] Other (See Comments) 07/08/2019  . Anastrozole  08/07/2019  . Letrozole  08/07/2019  . Tamoxifen  08/07/2019    Family History  Adopted: Yes     Social History   Socioeconomic History  . Marital status: Soil scientist    Spouse name: Not on file  . Number of children: 2  . Years of education: 16  . Highest education level: Bachelor's degree (e.g., BA, AB, BS)  Occupational History  . Occupation: Retired    Comment: Careers information officer  Tobacco Use  . Smoking status: Current Every Day Smoker    Packs/day: 0.25    Years: 55.00    Pack years: 13.75    Types: Cigarettes  . Smokeless tobacco: Never Used  Vaping Use  . Vaping Use: Never used  Substance and Sexual Activity  . Alcohol use: No  . Drug use: No  . Sexual activity: Not Currently  Other Topics Concern  . Not on file  Social History Narrative  . Not on file   Social Determinants of Health   Financial Resource Strain: Not on file  Food Insecurity: Not on file  Transportation Needs: Not on file  Physical Activity: Not on file  Stress: Not on file  Social Connections: Not on file  Intimate Partner Violence: Not on file    Subjective: Review of Systems  Constitutional: Negative for chills and fever.  HENT: Negative for congestion and hearing loss.   Eyes: Negative for blurred vision and double vision.  Respiratory: Negative for cough and shortness of breath.   Cardiovascular: Negative for chest pain and palpitations.  Gastrointestinal: Negative for abdominal pain, blood in stool, constipation, diarrhea, heartburn, melena and vomiting.       Dysphagia  Genitourinary: Negative for dysuria and urgency.  Musculoskeletal: Negative for joint pain and myalgias.  Skin: Negative for itching and rash.  Neurological: Negative for dizziness and headaches.  Psychiatric/Behavioral: Negative for depression. The patient is not nervous/anxious.        Objective: BP 125/79   Pulse 85   Temp (!) 97.3 F (36.3 C) (Temporal)   Ht 5\' 4"  (1.626 m)   Wt 172 lb 3.2 oz (78.1 kg)   BMI 29.56 kg/m  Physical Exam Constitutional:      Appearance: Normal appearance.   HENT:     Head: Normocephalic and atraumatic.  Eyes:     Extraocular Movements: Extraocular movements intact.     Conjunctiva/sclera: Conjunctivae normal.  Cardiovascular:     Rate and Rhythm: Normal rate and regular rhythm.  Pulmonary:     Effort: Pulmonary effort is normal.     Comments: Few scattered wheezes Abdominal:     General: Bowel sounds are normal.     Palpations: Abdomen is soft.  Musculoskeletal:        General: No swelling. Normal range of motion.     Cervical back: Normal range of motion and neck supple.  Skin:    General: Skin is warm and dry.     Coloration:  Skin is not jaundiced.  Neurological:     General: No focal deficit present.     Mental Status: She is alert and oriented to person, place, and time.  Psychiatric:        Mood and Affect: Mood normal.        Behavior: Behavior normal.      Assessment: *Dysphagia-new, worsening *Colon cancer screening *NSAID use *Tobacco use  Plan: In regards to patient's new onset dysphagia, etiology unclear.  She certainly is at risk for candidal esophagitis given inhaler use.  Other differential include peptic ulcer disease, esophagitis, gastritis, H. Pylori, duodenitis, or other. Will also evaluate for esophageal stricture, Schatzki's ring, esophageal web or other.   We will schedule today for EGD with possible dilation.  The risks including infection, bleed, or perforation as well as benefits, limitations, alternatives and imponderables have been reviewed with the patient. Potential for esophageal dilation, biopsy, etc. have also been reviewed.  Questions have been answered. All parties agreeable.  Patient is overdue for colonoscopy for colon cancer screening purposes.  Discussed this in depth with her today.  She would like to forego for now.  I did counsel her that by not doing this we may be missing colon polyps and/or cancer and she understands.    Thank you Dr. Darlyn Read for the kind referral  02/27/2020 1:22  PM   Disclaimer: This note was dictated with voice recognition software. Similar sounding words can inadvertently be transcribed and may not be corrected upon review.

## 2020-02-27 NOTE — Telephone Encounter (Signed)
Called [employer] to submit PA for EGD/ED. PA pending. Ref# [id number]

## 2020-02-27 NOTE — Progress Notes (Signed)
Primary Care Physician:  Claretta Fraise, MD Primary Gastroenterologist:  Dr. Abbey Chatters  Chief Complaint  Patient presents with  . Dysphagia    Pills get stuck in throat. Feels like pocket in throat    HPI:   Joanne Kramer is a 74 y.o. female who presents to the clinic today by referral from her PCP Dr. Livia Snellen for dysphagia.  Patient states her symptoms started approximately 6 months ago and have progressively worsening.  States is primarily oral pharyngeal with pills.  States they will "get stuck" at times.  Sometimes has issues with food, never fluids.  Is on chronic NSAIDs for joint pains.  No history of peptic ulcer disease or H. pylori that she knows of.  No epigastric pain.  No chronic reflux or heartburn.  In regards to colon cancer screening, she states that last colonoscopy was greater than 10 years ago.  No family history of colorectal malignancy.  No abdominal pain or unintentional weight loss.  No melena hematochezia.  Past Medical History:  Diagnosis Date  . Breast cancer ([place])   . Contact dermatitis    chronic itching but skin biopsy did not show anything specific  . COPD (chronic obstructive pulmonary disease) ([place])   . Emphysema of lung ([place])   . Headache   . 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 OA (osteoarthritis) of knee    right  . Pneumonia   . Requires supplemental oxygen     Past Surgical History:  Procedure Laterality Date  . BREAST LUMPECTOMY WITH RADIOACTIVE SEED AND SENTINEL LYMPH NODE BIOPSY Left 03/05/2018   Procedure: LEFT BREAST LUMPECTOMY WITH RADIOACTIVE SEED AND LEFT AXILLARY SENTINEL LYMPH NODE BIOPSY;  Surgeon: Rolm Bookbinder, MD;  Location: [facility];  Service: General;  Laterality: Left;  . BREAST SURGERY     left breast aspiration  . CESAREAN SECTION    . COLONOSCOPY    . EYE SURGERY    . KNEE SURGERY Right   . TONSILLECTOMY    . TOTAL KNEE ARTHROPLASTY Right 06/27/2017   Procedure: RIGHT TOTAL KNEE ARTHROPLASTY;  Surgeon:  Garald Balding, MD;  Location: [facility];  Service: Orthopedics;  Laterality: Right;    Current Outpatient Medications  Medication Sig Dispense Refill  . Budeson-Glycopyrrol-Formoterol (BREZTRI AEROSPHERE) 160-9-4.8 MCG/ACT AERO Inhale 2 puffs into the lungs 2 (two) times daily. 10.7 g 5  . Budeson-Glycopyrrol-Formoterol (BREZTRI AEROSPHERE) 160-9-4.8 MCG/ACT AERO Inhale 2 puffs into the lungs in the morning and at bedtime. 10.7 g 0  . Calcium Carb-Cholecalciferol (CALCIUM 600+D3 PO) Take 1 tablet by mouth daily.    . calcium carbonate (TUMS EX) 750 MG chewable tablet Chew 1-2 tablets by mouth 3 (three) times daily as needed (for heartburn/indigestion.).     Marland Kitchen clotrimazole (MYCELEX) 10 MG troche as needed.    . Cyanocobalamin (VITAMIN B12 PO) Take by mouth daily.    . Ipratropium-Albuterol (COMBIVENT RESPIMAT) 20-100 MCG/ACT AERS respimat INHALE 1 PUFF INTO THE LUNGS EVERY 4 (FOUR) HOURS AS NEEDED (USE 4 TIMES DAILY) 4 g 2  . Melatonin 5 MG TABS Take 5 mg by mouth at bedtime as needed (for sleep.).     Marland Kitchen Multiple Vitamin (MULTIVITAMIN WITH MINERALS) TABS tablet Take 1 tablet by mouth daily.    . naproxen sodium (ALEVE) 220 MG tablet Take 440 mg by mouth daily as needed (pain).    . OXYGEN Inhale 2 L into the lungs at bedtime. Bedtime and as needed    . triamcinolone cream (  KENALOG) 0.1 % Apply 1 application topically 2 (two) times daily as needed (for skin irritation/contact dermatitis). 453.6 g 1  . guaiFENesin (MUCINEX) 600 MG 12 hr tablet Take 2 tablets (1,200 mg total) by mouth 2 (two) times daily as needed for cough or to loosen phlegm. (Patient not taking: No sig reported)     No current facility-administered medications for this visit.    Allergies as of 02/27/2020 - Review Complete 02/27/2020  Allergen Reaction Noted  . Prolia [denosumab] Other (See Comments) 07/08/2019  . Anastrozole  08/07/2019  . Letrozole  08/07/2019  . Tamoxifen  08/07/2019    Family History  Adopted: Yes     Social History   Socioeconomic History  . Marital status: Soil scientist    Spouse name: Not on file  . Number of children: 2  . Years of education: 16  . Highest education level: Bachelor's degree (e.g., BA, AB, BS)  Occupational History  . Occupation: Retired    Comment: Careers information officer  Tobacco Use  . Smoking status: Current Every Day Smoker    Packs/day: 0.25    Years: 55.00    Pack years: 13.75    Types: Cigarettes  . Smokeless tobacco: Never Used  Vaping Use  . Vaping Use: Never used  Substance and Sexual Activity  . Alcohol use: No  . Drug use: No  . Sexual activity: Not Currently  Other Topics Concern  . Not on file  Social History Narrative  . Not on file   Social Determinants of Health   Financial Resource Strain: Not on file  Food Insecurity: Not on file  Transportation Needs: Not on file  Physical Activity: Not on file  Stress: Not on file  Social Connections: Not on file  Intimate Partner Violence: Not on file    Subjective: Review of Systems  Constitutional: Negative for chills and fever.  HENT: Negative for congestion and hearing loss.   Eyes: Negative for blurred vision and double vision.  Respiratory: Negative for cough and shortness of breath.   Cardiovascular: Negative for chest pain and palpitations.  Gastrointestinal: Negative for abdominal pain, blood in stool, constipation, diarrhea, heartburn, melena and vomiting.       Dysphagia  Genitourinary: Negative for dysuria and urgency.  Musculoskeletal: Negative for joint pain and myalgias.  Skin: Negative for itching and rash.  Neurological: Negative for dizziness and headaches.  Psychiatric/Behavioral: Negative for depression. The patient is not nervous/anxious.        Objective: BP 125/79   Pulse 85   Temp (!) 97.3 F (36.3 C) (Temporal)   Ht 5\' 4"  (1.626 m)   Wt 172 lb 3.2 oz (78.1 kg)   BMI 29.56 kg/m  Physical Exam Constitutional:      Appearance: Normal appearance.   HENT:     Head: Normocephalic and atraumatic.  Eyes:     Extraocular Movements: Extraocular movements intact.     Conjunctiva/sclera: Conjunctivae normal.  Cardiovascular:     Rate and Rhythm: Normal rate and regular rhythm.  Pulmonary:     Effort: Pulmonary effort is normal.     Comments: Few scattered wheezes Abdominal:     General: Bowel sounds are normal.     Palpations: Abdomen is soft.  Musculoskeletal:        General: No swelling. Normal range of motion.     Cervical back: Normal range of motion and neck supple.  Skin:    General: Skin is warm and dry.     Coloration:  Skin is not jaundiced.  Neurological:     General: No focal deficit present.     Mental Status: She is alert and oriented to person, place, and time.  Psychiatric:        Mood and Affect: Mood normal.        Behavior: Behavior normal.      Assessment: *Dysphagia-new, worsening *Colon cancer screening *NSAID use *Tobacco use  Plan: In regards to patient's new onset dysphagia, etiology unclear.  She certainly is at risk for candidal esophagitis given inhaler use.  Other differential include peptic ulcer disease, esophagitis, gastritis, H. Pylori, duodenitis, or other. Will also evaluate for esophageal stricture, Schatzki's ring, esophageal web or other.   We will schedule today for EGD with possible dilation.  The risks including infection, bleed, or perforation as well as benefits, limitations, alternatives and imponderables have been reviewed with the patient. Potential for esophageal dilation, biopsy, etc. have also been reviewed.  Questions have been answered. All parties agreeable.  Patient is overdue for colonoscopy for colon cancer screening purposes.  Discussed this in depth with her today.  She would like to forego for now.  I did counsel her that by not doing this we may be missing colon polyps and/or cancer and she understands.    Thank you Dr. Darlyn Read for the kind referral  02/27/2020 1:22  PM   Disclaimer: This note was dictated with voice recognition software. Similar sounding words can inadvertently be transcribed and may not be corrected upon review.

## 2020-02-27 NOTE — Patient Instructions (Signed)
23    Your procedure is scheduled on: 03/03/2020  Report to [facility] at     1:00 PM.  Call this number if you have problems the morning of surgery: [phone]   Remember:   Follow instructions on letter from office regarding when to stop eating and drinking        No Smoking the day of procedure      Take these medicines the morning of surgery with A SIP OF WATER: use inhalers   Do not wear jewelry, make-up or nail polish.  Do not wear lotions, powders, or perfumes. You may wear deodorant.                Do not bring valuables to the hospital.  Contacts, dentures or bridgework may not be worn into surgery.  Leave suitcase in the car. After surgery it may be brought to your room.  For patients admitted to the hospital, checkout time is 11:00 AM the day of discharge.   Patients discharged the day of surgery will not be allowed to drive home. Upper Endoscopy, Adult Upper endoscopy is a procedure to look inside the upper GI (gastrointestinal) tract. The upper GI tract is made up of:  The part of the body that moves food from your mouth to your stomach (esophagus).  The stomach.  The first part of your small intestine (duodenum). This procedure is also called esophagogastroduodenoscopy (EGD) or gastroscopy. In this procedure, your health care provider passes a thin, flexible tube (endoscope) through your mouth and down your esophagus into your stomach. A small camera is attached to the end of the tube. Images from the camera appear on a monitor in the exam room. During this procedure, your health care provider may also remove a small piece of tissue to be sent to a lab and examined under a microscope (biopsy). Your health care provider may do an upper endoscopy to diagnose cancers of the upper GI tract. You may also have this procedure to find the cause of other conditions, such as:  Stomach pain.  Heartburn.  Pain or problems when swallowing.  Nausea and vomiting.  Stomach  bleeding.  Stomach ulcers. Tell a health care provider about:  Any allergies you have.  All medicines you are taking, including vitamins, herbs, eye drops, creams, and over-the-counter medicines.  Any problems you or family members have had with anesthetic medicines.  Any blood disorders you have.  Any surgeries you have had.  Any medical conditions you have.  Whether you are pregnant or may be pregnant. What are the risks? Generally, this is a safe procedure. However, problems may occur, including:  Infection.  Bleeding.  Allergic reactions to medicines.  A tear or hole (perforation) in the esophagus, stomach, or duodenum. What happens before the procedure? Staying hydrated Follow instructions from your health care provider about hydration, which may include:  Up to 2 hours before the procedure - you may continue to drink clear liquids, such as water, clear fruit juice, black coffee, and plain tea.  Eating and drinking restrictions Follow instructions from your health care provider about eating and drinking, which may include:  8 hours before the procedure - stop eating heavy meals or foods, such as meat, fried foods, or fatty foods.  6 hours before the procedure - stop eating light meals or foods, such as toast or cereal.  6 hours before the procedure - stop drinking milk or drinks that contain milk.  2 hours before the procedure -  stop drinking clear liquids. Medicines Ask your health care provider about:  Changing or stopping your regular medicines. This is especially important if you are taking diabetes medicines or blood thinners.  Taking medicines such as aspirin and ibuprofen. These medicines can thin your blood. Do not take these medicines unless your health care provider tells you to take them.  Taking over-the-counter medicines, vitamins, herbs, and supplements. General instructions  Plan to have someone take you home from the hospital or clinic.  If  you will be going home right after the procedure, plan to have someone with you for 24 hours.  Ask your health care provider what steps will be taken to help prevent infection. What happens during the procedure?  1. An IV will be inserted into one of your veins. 2. You may be given one or more of the following: ? A medicine to help you relax (sedative). ? A medicine to numb the throat (local anesthetic). 3. You will lie on your left side on an exam table. 4. Your health care provider will pass the endoscope through your mouth and down your esophagus. 5. Your health care provider will use the scope to check the inside of your esophagus, stomach, and duodenum. Biopsies may be taken. 6. The endoscope will be removed. The procedure may vary among health care providers and hospitals. What happens after the procedure?  Your blood pressure, heart rate, breathing rate, and blood oxygen level will be monitored until you leave the hospital or clinic.  Do not drive for 24 hours if you were given a sedative during your procedure.  When your throat is no longer numb, you may be given some fluids to drink.  It is up to you to get the results of your procedure. Ask your health care provider, or the department that is doing the procedure, when your results will be ready. Summary  Upper endoscopy is a procedure to look inside the upper GI tract.  During the procedure, an IV will be inserted into one of your veins. You may be given a medicine to help you relax.  A medicine will be used to numb your throat.  The endoscope will be passed through your mouth and down your esophagus. This information is not intended to replace advice given to you by your health care provider. Make sure you discuss any questions you have with your health care provider. Document Revised: 08/09/2017 Document Reviewed: 07/17/2017 Elsevier Patient Education  [place] After  Please read the instructions outlined below and refer to this sheet in the next few weeks. These discharge instructions provide you with general information on caring for yourself after you leave the hospital. Your doctor may also give  you specific instructions. While your treatment has been planned according to the most current medical practices available, unavoidable complications occasionally occur. If you have any problems or questions after discharge, please call your doctor. HOME CARE INSTRUCTIONS Activity  You may resume your regular activity but move at a slower pace for the next 24 hours.   Take frequent rest periods for the next 24 hours.   Walking will help expel (get rid of) the air and reduce the bloated feeling in your abdomen.   No driving for 24 hours (because of the anesthesia (medicine) used during the test).   You may shower.   Do not sign any important legal documents or operate any machinery for 24 hours (because of the anesthesia used during the test).  Nutrition  Drink plenty of fluids.   You may resume your normal diet.   Begin with a light meal and progress to your normal diet.   Avoid alcoholic beverages for 24 hours or as instructed by your caregiver.  Medications You may resume your normal medications unless your caregiver tells you otherwise. What you can expect today  You may experience abdominal discomfort such as a feeling of fullness or "gas" pains.   You may experience a sore throat for 2 to 3 days. This is normal. Gargling with salt water may help this.  Follow-up Your doctor will discuss the results of your test with you. SEEK IMMEDIATE MEDICAL CARE IF:  You have excessive nausea (feeling sick to your stomach) and/or vomiting.   You have severe abdominal pain and distention (swelling).   You have trouble swallowing.   You have a  temperature over 100 F (37.8 C).   You have rectal bleeding or vomiting of blood.  Document Released: 09/29/2003 Document Revised: 02/03/2011 Document Reviewed: 04/11/2007  Esophageal Dilatation Esophageal dilatation, also called esophageal dilation, is a procedure to widen or open (dilate) a blocked or narrowed part of the esophagus. The esophagus is the part of the body that moves food and liquid from the mouth to the stomach. You may need this procedure if:  You have a buildup of scar tissue in your esophagus that makes it difficult, painful, or impossible to swallow. This can be caused by gastroesophageal reflux disease (GERD).  You have cancer of the esophagus.  There is a problem with how food moves through your esophagus. In some cases, you may need this procedure repeated at a later time to dilate the esophagus gradually. Tell a health care provider about:  Any allergies you have.  All medicines you are taking, including vitamins, herbs, eye drops, creams, and over-the-counter medicines.  Any problems you or family members have had with anesthetic medicines.  Any blood disorders you have.  Any surgeries you have had.  Any medical conditions you have.  Any antibiotic medicines you are required to take before dental procedures.  Whether you are pregnant or may be pregnant. What are the risks? Generally, this is a safe procedure. However, problems may occur, including:  Bleeding due to a tear in the lining of the esophagus.  A hole (perforation) in the esophagus. What happens before the procedure?  Follow instructions from your health care provider about eating or drinking restrictions.  Ask your health care provider about changing or stopping your regular medicines. This is especially important if you are taking diabetes medicines or blood thinners.  Plan to have someone take you home from the hospital or clinic.  Plan to have a responsible adult care  for you for  at least 24 hours after you leave the hospital or clinic. This is important. What happens during the procedure?  You may be given a medicine to help you relax (sedative).  A numbing medicine may be sprayed into the back of your throat, or you may gargle the medicine.  Your health care provider may perform the dilatation using various surgical instruments, such as: ? Simple dilators. This instrument is carefully placed in the esophagus to stretch it. ? Guided wire bougies. This involves using an endoscope to insert a wire into the esophagus. A dilator is passed over this wire to enlarge the esophagus. Then the wire is removed. ? Balloon dilators. An endoscope with a small balloon at the end is inserted into the esophagus. The balloon is inflated to stretch the esophagus and open it up. The procedure may vary among health care providers and hospitals. What happens after the procedure?  Your blood pressure, heart rate, breathing rate, and blood oxygen level will be monitored until the medicines you were given have worn off.  Your throat may feel slightly sore and numb. This will improve slowly over time.  You will not be allowed to eat or drink until your throat is no longer numb.  When you are able to drink, urinate, and sit on the edge of the bed without nausea or dizziness, you may be able to return home. Follow these instructions at home:  Take over-the-counter and prescription medicines only as told by your health care provider.  Do not drive for 24 hours if you were given a sedative during your procedure.  You should have a responsible adult with you for 24 hours after the procedure.  Follow instructions from your health care provider about any eating or drinking restrictions.  Do not use any products that contain nicotine or tobacco, such as cigarettes and e-cigarettes. If you need help quitting, ask your health care provider.  Keep all follow-up visits as told by your health  care provider. This is important. Get help right away if you:  Have a fever.  Have chest pain.  Have pain that is not relieved by medication.  Have trouble breathing.  Have trouble swallowing.  Vomit blood. Summary  Esophageal dilatation, also called esophageal dilation, is a procedure to widen or open (dilate) a blocked or narrowed part of the esophagus.  Plan to have someone take you home from the hospital or clinic.  For this procedure, a numbing medicine may be sprayed into the back of your throat, or you may gargle the medicine.  Do not drive for 24 hours if you were given a sedative during your procedure. This information is not intended to replace advice given to you by your health care provider. Make sure you discuss any questions you have with your health care provider. Document Revised: 12/12/2018 Document Reviewed: 12/20/2016 Elsevier Patient Education  2020 [employer].

## 2020-02-27 NOTE — Telephone Encounter (Signed)
Left detailed message on VM regarding pre-op/covid test appt details.

## 2020-02-27 NOTE — Telephone Encounter (Signed)
PA approved. DOS 03/03/2020-06/01/2020. Auth# [id number]

## 2020-02-27 NOTE — Patient Instructions (Signed)
We will schedule you for EGD to further evaluate your difficulty swallowing. Further recommendations to follow.  At [facility] we value your feedback. You may receive a survey about your visit today. Please share your experience as we strive to create trusting relationships with our patients to provide genuine, compassionate, quality care.  We appreciate your understanding and patience as we review any laboratory studies, imaging, and other diagnostic tests that are ordered as we care for you. Our office policy is 5 business days for review of these results, and any emergent or urgent results are addressed in a timely manner for your best interest. If you do not hear from our office in 1 week, please contact us.   We also encourage the use of MyChart, which contains your medical information for your review as well. If you are not enrolled in this feature, an access code is on this after visit summary for your convenience. Thank you for allowing [place] to be involved in your care.  It was great to see you today!  I hope you have a great rest of your winter!!    Hennie Duos. Marletta Lor, D.O. Gastroenterology and Hepatology [facility]

## 2020-02-28 DIAGNOSIS — J449 Chronic obstructive pulmonary disease, unspecified: Secondary | ICD-10-CM | POA: Diagnosis not present

## 2020-03-02 ENCOUNTER — Other Ambulatory Visit (HOSPITAL_COMMUNITY)
Admission: RE | Admit: 2020-03-02 | Discharge: 2020-03-02 | Disposition: A | Discharge status: Home / Self Care | Payer: Medicare Other | Source: Ambulatory Visit | Attending: Internal Medicine | Admitting: Internal Medicine

## 2020-03-02 ENCOUNTER — Encounter (HOSPITAL_COMMUNITY)
Admission: RE | Admit: 2020-03-02 | Discharge: 2020-03-02 | Disposition: A | Discharge status: Home / Self Care | Payer: Medicare Other | Source: Ambulatory Visit | Attending: Internal Medicine | Admitting: Internal Medicine

## 2020-03-02 ENCOUNTER — Other Ambulatory Visit: Payer: Self-pay

## 2020-03-02 ENCOUNTER — Encounter (HOSPITAL_COMMUNITY): Payer: Self-pay

## 2020-03-02 DIAGNOSIS — J449 Chronic obstructive pulmonary disease, unspecified: Secondary | ICD-10-CM | POA: Insufficient documentation

## 2020-03-02 DIAGNOSIS — Z01818 Encounter for other preprocedural examination: Secondary | ICD-10-CM | POA: Diagnosis not present

## 2020-03-02 DIAGNOSIS — Z20822 Contact with and (suspected) exposure to covid-19: Secondary | ICD-10-CM | POA: Insufficient documentation

## 2020-03-02 LAB — SARS CORONAVIRUS 2 (TAT 6-24 HRS): SARS Coronavirus 2: NEGATIVE

## 2020-03-03 ENCOUNTER — Encounter (HOSPITAL_COMMUNITY)
Admission: RE | Disposition: A | Discharge status: Home / Self Care | Payer: Self-pay | Source: Home / Self Care | Attending: Internal Medicine

## 2020-03-03 ENCOUNTER — Ambulatory Visit (HOSPITAL_COMMUNITY): Payer: Medicare Other

## 2020-03-03 ENCOUNTER — Ambulatory Visit (HOSPITAL_COMMUNITY)
Admission: RE | Admit: 2020-03-03 | Discharge: 2020-03-03 | Disposition: A | Discharge status: Home / Self Care | Payer: Medicare Other | Attending: Internal Medicine | Admitting: Internal Medicine

## 2020-03-03 ENCOUNTER — Encounter (HOSPITAL_COMMUNITY): Payer: Self-pay

## 2020-03-03 DIAGNOSIS — Z888 Allergy status to other drugs, medicaments and biological substances status: Secondary | ICD-10-CM | POA: Insufficient documentation

## 2020-03-03 DIAGNOSIS — R1310 Dysphagia, unspecified: Secondary | ICD-10-CM | POA: Diagnosis not present

## 2020-03-03 DIAGNOSIS — Z853 Personal history of malignant neoplasm of breast: Secondary | ICD-10-CM | POA: Diagnosis not present

## 2020-03-03 DIAGNOSIS — K2970 Gastritis, unspecified, without bleeding: Secondary | ICD-10-CM | POA: Diagnosis not present

## 2020-03-03 DIAGNOSIS — F17200 Nicotine dependence, unspecified, uncomplicated: Secondary | ICD-10-CM | POA: Diagnosis not present

## 2020-03-03 DIAGNOSIS — K2100 Gastro-esophageal reflux disease with esophagitis, without bleeding: Secondary | ICD-10-CM | POA: Insufficient documentation

## 2020-03-03 DIAGNOSIS — K222 Esophageal obstruction: Secondary | ICD-10-CM | POA: Diagnosis not present

## 2020-03-03 DIAGNOSIS — K319 Disease of stomach and duodenum, unspecified: Secondary | ICD-10-CM | POA: Insufficient documentation

## 2020-03-03 DIAGNOSIS — Z9981 Dependence on supplemental oxygen: Secondary | ICD-10-CM | POA: Insufficient documentation

## 2020-03-03 DIAGNOSIS — J439 Emphysema, unspecified: Secondary | ICD-10-CM | POA: Insufficient documentation

## 2020-03-03 DIAGNOSIS — J449 Chronic obstructive pulmonary disease, unspecified: Secondary | ICD-10-CM | POA: Diagnosis not present

## 2020-03-03 DIAGNOSIS — Z79899 Other long term (current) drug therapy: Secondary | ICD-10-CM | POA: Diagnosis not present

## 2020-03-03 DIAGNOSIS — K3189 Other diseases of stomach and duodenum: Secondary | ICD-10-CM | POA: Diagnosis not present

## 2020-03-03 HISTORY — PX: ESOPHAGEAL BRUSHING: SHX6842

## 2020-03-03 HISTORY — PX: BIOPSY: SHX5522

## 2020-03-03 HISTORY — PX: BALLOON DILATION: SHX5330

## 2020-03-03 HISTORY — PX: ESOPHAGOGASTRODUODENOSCOPY (EGD) WITH PROPOFOL: SHX5813

## 2020-03-03 LAB — KOH PREP: KOH Prep: NONE SEEN

## 2020-03-03 SURGERY — ESOPHAGOGASTRODUODENOSCOPY (EGD) WITH PROPOFOL
Anesthesia: General

## 2020-03-03 MED ORDER — LIDOCAINE HCL (CARDIAC) PF 100 MG/5ML IV SOSY
PREFILLED_SYRINGE | INTRAVENOUS | Status: DC | PRN
  Administered 2020-03-03: 50 mg via INTRAVENOUS

## 2020-03-03 MED ORDER — OMEPRAZOLE 20 MG PO CPDR: 20.0000 mg | DELAYED_RELEASE_CAPSULE | Freq: Two times a day (BID) | ORAL | 1 refills | Status: DC

## 2020-03-03 MED ORDER — PROPOFOL 500 MG/50ML IV EMUL: INTRAVENOUS | Status: DC | PRN

## 2020-03-03 MED ORDER — LIDOCAINE VISCOUS HCL 2 % MT SOLN
15.0000 mL | Freq: Once | OROMUCOSAL | Status: AC
  Administered 2020-03-03: 15 mL via OROMUCOSAL

## 2020-03-03 MED ORDER — LIDOCAINE VISCOUS HCL 2 % MT SOLN
OROMUCOSAL | Status: AC
  Filled 2020-03-03: qty 15

## 2020-03-03 MED ORDER — PROPOFOL 10 MG/ML IV BOLUS
INTRAVENOUS | Status: DC | PRN
  Administered 2020-03-03: 30 mg via INTRAVENOUS
  Administered 2020-03-03: 70 mg via INTRAVENOUS

## 2020-03-03 MED ORDER — STERILE WATER FOR IRRIGATION IR SOLN
Status: DC | PRN
  Administered 2020-03-03: 100 mL

## 2020-03-03 MED ORDER — LACTATED RINGERS IV SOLN: INTRAVENOUS | Status: DC

## 2020-03-03 NOTE — Interval H&P Note (Signed)
History and Physical Interval Note:  03/03/2020 1:02 PM  Joanne Kramer  has presented today for surgery, with the diagnosis of dysphagia.  The various methods of treatment have been discussed with the patient and family. After consideration of risks, benefits and other options for treatment, the patient has consented to  Procedure(s) with comments: ESOPHAGOGASTRODUODENOSCOPY (EGD) WITH PROPOFOL (N/A) - 3:00pm, LM for pt to call BALLOON DILATION (N/A) as a surgical intervention.  The patient's history has been reviewed, patient examined, no change in status, stable for surgery.  I have reviewed the patient's chart and labs.  Questions were answered to the patient's satisfaction.     Lanelle Bal

## 2020-03-03 NOTE — Op Note (Signed)
[facility] Patient Name: Analynn Daum Procedure Date: 03/03/2020 12:59 PM MRN: [id number] Date of Birth: February 29, 1946 Attending MD: Elon Alas. Abbey Chatters DO CSN: [id number] Age: 75 Admit Type: Outpatient Procedure:                Upper GI endoscopy Indications:              Dysphagia Providers:                Elon Alas. Abbey Chatters, DO, Lambert Mody, Aram Candela Referring MD:              Medicines:                See the Anesthesia note for documentation of the                            administered medications Complications:            No immediate complications. Estimated Blood Loss:     Estimated blood loss was minimal. Procedure:                Pre-Anesthesia Assessment:                           - The anesthesia plan was to use monitored                            anesthesia care (MAC).                           After obtaining informed consent, the endoscope was                            passed under direct vision. Throughout the                            procedure, the patient's blood pressure, pulse, and                            oxygen saturations were monitored continuously. The                            Endoscope was introduced through the mouth, and                            advanced to the second part of duodenum. The upper                            GI endoscopy was accomplished without difficulty.                            The patient tolerated the procedure well. Scope In: 1:31:12 PM Scope Out: 1:38:43 PM Total Procedure Duration: 0 hours 7 minutes 31 seconds  Findings:      LA Grade A (one or more mucosal breaks less than 5 mm, not  extending       between tops of 2 mucosal folds) esophagitis with no bleeding was found       at the gastroesophageal junction.      Cells for cytology were obtained by brushing in the middle third of the       esophagus.      A multiple Schatzki rings was found in the lower third of the  esophagus.       A TTS dilator was passed through the scope. Dilation with an 18-19-20 mm       balloon dilator was performed to 20 mm. The dilation site was examined       and showed mild mucosal disruption and moderate improvement in luminal       narrowing.      Diffuse moderate inflammation characterized by erosions and erythema was       found in the entire examined stomach. Biopsies were taken with a cold       forceps for Helicobacter pylori testing.      The duodenal bulb, first portion of the duodenum and second portion of       the duodenum were normal. Biopsies for histology were taken with a cold       forceps for evaluation of celiac disease. Impression:               - LA Grade A reflux esophagitis with no bleeding.                           - Multiple Schatzki ring. Dilated.                           - Gastritis. Biopsied.                           - Normal duodenal bulb, first portion of the                            duodenum and second portion of the duodenum.                            Biopsied.                           - Cells for cytology obtained in the middle third                            of the esophagus. Moderate Sedation:      Per Anesthesia Care Recommendation:           - Patient has a contact number available for                            emergencies. The signs and symptoms of potential                            delayed complications were discussed with the                            patient. Return to normal activities tomorrow.  Written discharge instructions were provided to the                            patient.                           - Resume previous diet.                           - Continue present medications.                           - Await pathology results.                           - Repeat upper endoscopy PRN for retreatment.                           - Use a proton pump inhibitor PO BID for 8  weeks. Procedure Code(s):        --- Professional ---                           [phone], Esophagogastroduodenoscopy, flexible,                            transoral; with transendoscopic balloon dilation of                            esophagus (less than 30 mm diameter)                           [id number], 59, Esophagogastroduodenoscopy, flexible,                            transoral; with biopsy, single or multiple Diagnosis Code(s):        --- Professional ---                           K21.00, Gastro-esophageal reflux disease with                            esophagitis, without bleeding                           K22.2, Esophageal obstruction                           K29.70, Gastritis, unspecified, without bleeding                           R13.10, Dysphagia, unspecified CPT copyright 2019 American Medical Association. All rights reserved. The codes documented in this report are preliminary and upon coder review may  be revised to meet current compliance requirements. Elon Alas. Abbey Chatters, DO [place] Abbey Chatters, DO 03/03/2020 1:49:08 PM This report has been signed electronically. Number of Addenda: 0

## 2020-03-03 NOTE — Transfer of Care (Signed)
Immediate Anesthesia Transfer of Care Note  Patient: Joanne Kramer  Procedure(s) Performed: ESOPHAGOGASTRODUODENOSCOPY (EGD) WITH PROPOFOL (N/A ) BALLOON DILATION (N/A ) BIOPSY ESOPHAGEAL BRUSHING  Patient Location: PACU  Anesthesia Type:General  Level of Consciousness: awake, alert  and oriented  Airway & Oxygen Therapy: Patient Spontanous Breathing and Patient connected to nasal cannula oxygen  Post-op Assessment: Report given to RN and Post -op Vital signs reviewed and stable  Post vital signs: Reviewed and stable  Last Vitals:  Vitals Value Taken Time  BP    Temp    Pulse 91 03/03/20 1343  Resp 20 03/03/20 1343  SpO2 93 % 03/03/20 1343  Vitals shown include unvalidated device data.  Last Pain:  Vitals:   03/03/20 1327  TempSrc:   PainSc: 0-No pain         Complications: No complications documented.

## 2020-03-03 NOTE — Anesthesia Preprocedure Evaluation (Signed)
Anesthesia Evaluation  Patient identified by MRN, date of birth, ID band Patient awake    Reviewed: Allergy & Precautions, H&P , NPO status , Patient's Chart, lab work & pertinent test results, reviewed documented beta blocker date and time   Airway Mallampati: II  TM Distance: >3 FB Neck ROM: full    Dental no notable dental hx.    Pulmonary pneumonia, COPD,  oxygen dependent, Current Smoker and Patient abstained from smoking.,    Pulmonary exam normal breath sounds clear to auscultation       Cardiovascular Exercise Tolerance: Good negative cardio ROS   Rhythm:regular Rate:Normal     Neuro/Psych  Headaches,  Neuromuscular disease negative psych ROS   GI/Hepatic negative GI ROS, Neg liver ROS,   Endo/Other  negative endocrine ROS  Renal/GU negative Renal ROS  negative genitourinary   Musculoskeletal   Abdominal   Peds  Hematology negative hematology ROS (+)   Anesthesia Other Findings   Reproductive/Obstetrics negative OB ROS                             Anesthesia Physical Anesthesia Plan  ASA: III  Anesthesia Plan: General   Post-op Pain Management:    Induction:   PONV Risk Score and Plan: Propofol infusion  Airway Management Planned:   Additional Equipment:   Intra-op Plan:   Post-operative Plan:   Informed Consent: I have reviewed the patients History and Physical, chart, labs and discussed the procedure including the risks, benefits and alternatives for the proposed anesthesia with the patient or authorized representative who has indicated his/her understanding and acceptance.     Dental Advisory Given  Plan Discussed with: CRNA  Anesthesia Plan Comments:         Anesthesia Quick Evaluation

## 2020-03-03 NOTE — Anesthesia Postprocedure Evaluation (Signed)
Anesthesia Post Note  Patient: Joanne Kramer  Procedure(s) Performed: ESOPHAGOGASTRODUODENOSCOPY (EGD) WITH PROPOFOL (N/A ) BALLOON DILATION (N/A ) BIOPSY ESOPHAGEAL BRUSHING  Patient location during evaluation: PACU Anesthesia Type: General Level of consciousness: awake and oriented Pain management: pain level controlled Vital Signs Assessment: post-procedure vital signs reviewed and stable Respiratory status: spontaneous breathing, respiratory function stable and patient connected to nasal cannula oxygen (Pt on Home O2) Cardiovascular status: blood pressure returned to baseline and stable Postop Assessment: no apparent nausea or vomiting Anesthetic complications: no   No complications documented.   Last Vitals:  Vitals:   03/03/20 1312  BP: (!) 146/81  Pulse: 76  Resp: 17  Temp: 36.8 C    Last Pain:  Vitals:   03/03/20 1327  TempSrc:   PainSc: 0-No pain                 Lorin Glass

## 2020-03-03 NOTE — Discharge Instructions (Signed)
EGD Discharge instructions Please read the instructions outlined below and refer to this sheet in the next few weeks. These discharge instructions provide you with general information on caring for yourself after you leave the hospital. Your doctor may also give you specific instructions. While your treatment has been planned according to the most current medical practices available, unavoidable complications occasionally occur. If you have any problems or questions after discharge, please call your doctor. ACTIVITY  You may resume your regular activity but move at a slower pace for the next 24 hours.   Take frequent rest periods for the next 24 hours.   Walking will help expel (get rid of) the air and reduce the bloated feeling in your abdomen.   No driving for 24 hours (because of the anesthesia (medicine) used during the test).   You may shower.   Do not sign any important legal documents or operate any machinery for 24 hours (because of the anesthesia used during the test).  NUTRITION  Drink plenty of fluids.   You may resume your normal diet.   Begin with a light meal and progress to your normal diet.   Avoid alcoholic beverages for 24 hours or as instructed by your caregiver.  MEDICATIONS  You may resume your normal medications unless your caregiver tells you otherwise.  WHAT YOU CAN EXPECT TODAY  You may experience abdominal discomfort such as a feeling of fullness or "gas" pains.  FOLLOW-UP  Your doctor will discuss the results of your test with you.  SEEK IMMEDIATE MEDICAL ATTENTION IF ANY OF THE FOLLOWING OCCUR:  Excessive nausea (feeling sick to your stomach) and/or vomiting.   Severe abdominal pain and distention (swelling).   Trouble swallowing.   Temperature over 101 F (37.8 C).   Rectal bleeding or vomiting of blood.   Your EGD showed a moderate amount of inflammation in your stomach, esophagus, and small bowel.  I took biopsies of all the above to rule  out underlying infection.  I also dilated your esophagus as there was a narrowing.  Hopefully this helps with your swallowing.  I want you to start taking a medication called omeprazole 20 mg twice daily.  I want you to take this 30 minutes before breakfast and 30 minutes for dinner.  I sent a 66-month supply to your mail in pharmacy.  Follow-up with GI in 3 months or sooner if needed.   I hope you have a great rest of your week!  Hennie Duos. Marletta Lor, D.O. Gastroenterology and Hepatology [facility]

## 2020-03-05 ENCOUNTER — Other Ambulatory Visit: Payer: Self-pay

## 2020-03-05 LAB — SURGICAL PATHOLOGY

## 2020-03-06 ENCOUNTER — Encounter (HOSPITAL_COMMUNITY): Payer: Self-pay | Admitting: Internal Medicine

## 2020-03-09 ENCOUNTER — Ambulatory Visit (HOSPITAL_COMMUNITY): Payer: Medicare Other

## 2020-03-09 ENCOUNTER — Inpatient Hospital Stay (HOSPITAL_COMMUNITY): Payer: Medicare Other

## 2020-03-10 ENCOUNTER — Inpatient Hospital Stay (HOSPITAL_COMMUNITY): Payer: Medicare Other | Attending: Hematology

## 2020-03-10 ENCOUNTER — Other Ambulatory Visit: Payer: Self-pay

## 2020-03-10 DIAGNOSIS — M818 Other osteoporosis without current pathological fracture: Secondary | ICD-10-CM | POA: Insufficient documentation

## 2020-03-10 DIAGNOSIS — Z79899 Other long term (current) drug therapy: Secondary | ICD-10-CM | POA: Insufficient documentation

## 2020-03-10 DIAGNOSIS — Z170 Estrogen receptor positive status [ER+]: Secondary | ICD-10-CM | POA: Insufficient documentation

## 2020-03-10 DIAGNOSIS — M791 Myalgia, unspecified site: Secondary | ICD-10-CM | POA: Diagnosis not present

## 2020-03-10 DIAGNOSIS — F17210 Nicotine dependence, cigarettes, uncomplicated: Secondary | ICD-10-CM | POA: Diagnosis not present

## 2020-03-10 DIAGNOSIS — R1310 Dysphagia, unspecified: Secondary | ICD-10-CM | POA: Insufficient documentation

## 2020-03-10 DIAGNOSIS — C50412 Malignant neoplasm of upper-outer quadrant of left female breast: Secondary | ICD-10-CM | POA: Diagnosis not present

## 2020-03-10 LAB — VITAMIN B12: Vitamin B-12: 656 pg/mL (ref 180–914)

## 2020-03-10 LAB — COMPREHENSIVE METABOLIC PANEL
ALT: 13 U/L (ref 0–44)
AST: 14 U/L — ABNORMAL LOW (ref 15–41)
Albumin: 4 g/dL (ref 3.5–5.0)
Alkaline Phosphatase: 86 U/L (ref 38–126)
Anion gap: 8 (ref 5–15)
BUN: 12 mg/dL (ref 8–23)
CO2: 32 mmol/L (ref 22–32)
Calcium: 9 mg/dL (ref 8.9–10.3)
Chloride: 99 mmol/L (ref 98–111)
Creatinine, Ser: 0.75 mg/dL (ref 0.44–1.00)
GFR, Estimated: >60 mL/min (ref >60)
Glucose, Bld: 159 mg/dL — ABNORMAL HIGH (ref 70–99)
Potassium: 3.9 mmol/L (ref 3.5–5.1)
Sodium: 139 mmol/L (ref 135–145)
Total Bilirubin: 0.5 mg/dL (ref 0.3–1.2)
Total Protein: 7.2 g/dL (ref 6.5–8.1)

## 2020-03-10 LAB — CBC WITH DIFFERENTIAL/PLATELET
Abs Immature Granulocytes: 0.02 10*3/uL (ref 0.00–0.07)
Basophils Absolute: 0.1 10*3/uL (ref 0.0–0.1)
Basophils Relative: 1 %
Eosinophils Absolute: 0.2 10*3/uL (ref 0.0–0.5)
Eosinophils Relative: 3 %
HCT: 46.3 % — ABNORMAL HIGH (ref 36.0–46.0)
Hemoglobin: 14.1 g/dL (ref 12.0–15.0)
Immature Granulocytes: 0 %
Lymphocytes Relative: 39 %
Lymphs Abs: 2.2 10*3/uL (ref 0.7–4.0)
MCH: 30.6 pg (ref 26.0–34.0)
MCHC: 30.5 g/dL (ref 30.0–36.0)
MCV: 100.4 fL — ABNORMAL HIGH (ref 80.0–100.0)
Monocytes Absolute: 0.4 10*3/uL (ref 0.1–1.0)
Monocytes Relative: 7 %
Neutro Abs: 2.8 10*3/uL (ref 1.7–7.7)
Neutrophils Relative %: 50 %
Platelets: 161 10*3/uL (ref 150–400)
RBC: 4.61 MIL/uL (ref 3.87–5.11)
RDW: 14.1 % (ref 11.5–15.5)
WBC: 5.6 10*3/uL (ref 4.0–10.5)
nRBC: 0 % (ref 0.0–0.2)

## 2020-03-10 LAB — LACTATE DEHYDROGENASE: LDH: 124 U/L (ref 98–192)

## 2020-03-10 LAB — FOLATE: Folate: 15.8 ng/mL (ref >5.9)

## 2020-03-10 LAB — VITAMIN D 25 HYDROXY (VIT D DEFICIENCY, FRACTURES): Vit D, 25-Hydroxy: 31.83 ng/mL (ref 30–100)

## 2020-03-11 ENCOUNTER — Ambulatory Visit (HOSPITAL_COMMUNITY): Payer: Medicare Other | Admitting: Nurse Practitioner

## 2020-03-11 ENCOUNTER — Ambulatory Visit (HOSPITAL_COMMUNITY): Payer: Medicare Other

## 2020-03-11 ENCOUNTER — Other Ambulatory Visit (HOSPITAL_COMMUNITY): Payer: Medicare Other

## 2020-03-12 DIAGNOSIS — J449 Chronic obstructive pulmonary disease, unspecified: Secondary | ICD-10-CM | POA: Diagnosis not present

## 2020-03-17 ENCOUNTER — Inpatient Hospital Stay (HOSPITAL_BASED_OUTPATIENT_CLINIC_OR_DEPARTMENT_OTHER): Payer: Medicare Other | Admitting: Oncology

## 2020-03-17 ENCOUNTER — Other Ambulatory Visit: Payer: Self-pay

## 2020-03-17 DIAGNOSIS — R911 Solitary pulmonary nodule: Secondary | ICD-10-CM | POA: Diagnosis not present

## 2020-03-17 DIAGNOSIS — M810 Age-related osteoporosis without current pathological fracture: Secondary | ICD-10-CM | POA: Diagnosis not present

## 2020-03-17 DIAGNOSIS — C50412 Malignant neoplasm of upper-outer quadrant of left female breast: Secondary | ICD-10-CM | POA: Diagnosis not present

## 2020-03-17 DIAGNOSIS — Z170 Estrogen receptor positive status [ER+]: Secondary | ICD-10-CM | POA: Diagnosis not present

## 2020-03-17 NOTE — Progress Notes (Signed)
[place], [place] [address]   CLINIC:  Medical Oncology/Hematology  PCP:  Claretta Fraise, [place] [address] [phone]   REASON FOR VISIT: Follow-up for breast cancer   CURRENT THERAPY: Surveillance   INTERVAL HISTORY:  Joanne Kramer 75 y.o. female returns for routine follow-up for breast cancer. She was last seen in clinic on 09/06/2019.  At that visit, she was doing well.  She had recently stopped her Prolia injections secondary to muscle weakness.  In the interim, she was seen by pulmonology Dr. Halford Chessman.  She was using Chantix to try to quit smoking.  Unfortunately, developed nausea and had to stop.  She was able to cut back to half a pack of cigarettes per day.  She continues to smoke.  She was referred to physical therapy by her PCP for bilateral shoulder pain.  She completed 7 treatments.  She was referred to GI for dysphagia.  She was scheduled for EGD and possible dilatation which was completed on 03/03/2020.  Findings were consistent with esophagitis with no bleeding, multiple Schatzki ring which were dilated, gastritis which was biopsied and duodenum.   Today, she reports doing well.  She denies any new concerns.  Muscle weakness has improved since stopping Prolia.  She denies any new lumps or bumps present.  She denies any new bone pain. Denies any nausea, vomiting, or diarrhea. Denies any new pains. Had not noticed any recent bleeding such as epistaxis, hematuria or hematochezia. Denies recent chest pain on exertion, shortness of breath on minimal exertion, pre-syncopal episodes, or palpitations. Denies any numbness or tingling in hands or feet. Denies any recent fevers, infections, or recent hospitalizations. Patient reports appetite at 100% and energy level at 25%.  She is eating well maintain her weight at this time.   REVIEW OF SYSTEMS:  Review of Systems  Constitutional: Negative for appetite change, fatigue, fever  and unexpected weight change.  HENT:   Negative for nosebleeds, sore throat and trouble swallowing.   Eyes: Negative.   Respiratory: Negative.  Negative for cough, shortness of breath and wheezing.   Cardiovascular: Negative.  Negative for chest pain and leg swelling.  Gastrointestinal: Negative for abdominal pain, blood in stool, constipation, diarrhea, nausea and vomiting.  Endocrine: Negative.   Genitourinary: Negative.  Negative for bladder incontinence, hematuria and nocturia.   Musculoskeletal: Negative.  Negative for back pain and flank pain.  Skin: Negative.   Neurological: Negative.  Negative for dizziness, headaches, light-headedness and numbness.  Hematological: Negative.   Psychiatric/Behavioral: Negative.  Negative for confusion. The patient is not nervous/anxious.      PAST MEDICAL/SURGICAL HISTORY:  Past Medical History:  Diagnosis Date  . Breast cancer ([place])   . Contact dermatitis    chronic itching but skin biopsy did not show anything specific  . COPD (chronic obstructive pulmonary disease) ([place])   . Emphysema of lung ([place])   . Headache   . 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 OA (osteoarthritis) of knee    right  . Pneumonia   . Requires supplemental oxygen    Past Surgical History:  Procedure Laterality Date  . BALLOON DILATION N/A 03/03/2020   Procedure: BALLOON DILATION;  Surgeon: Eloise Harman, DO;  Location: [facility];  Service: Endoscopy;  Laterality: N/A;  . BIOPSY  03/03/2020   Procedure: BIOPSY;  Surgeon: Eloise Harman, DO;  Location: [facility];  Service: Endoscopy;;  . BREAST  LUMPECTOMY WITH RADIOACTIVE SEED AND SENTINEL LYMPH NODE BIOPSY Left 03/05/2018   Procedure: LEFT BREAST LUMPECTOMY WITH RADIOACTIVE SEED AND LEFT AXILLARY SENTINEL LYMPH NODE BIOPSY;  Surgeon: Emelia Loron, MD;  Location: [facility] OR;  Service: General;  Laterality: Left;  . BREAST SURGERY     left breast aspiration  . CESAREAN SECTION    .  COLONOSCOPY    . ESOPHAGEAL BRUSHING  03/03/2020   Procedure: ESOPHAGEAL BRUSHING;  Surgeon: Lanelle Bal, DO;  Location: [facility];  Service: Endoscopy;;  . ESOPHAGOGASTRODUODENOSCOPY (EGD) WITH PROPOFOL N/A 03/03/2020   Procedure: ESOPHAGOGASTRODUODENOSCOPY (EGD) WITH PROPOFOL;  Surgeon: Lanelle Bal, DO;  Location: [facility];  Service: Endoscopy;  Laterality: N/A;  3:00pm, LM for pt to call  . EYE SURGERY Bilateral   . KNEE SURGERY Right   . TONSILLECTOMY    . TOTAL KNEE ARTHROPLASTY Right 06/27/2017   Procedure: RIGHT TOTAL KNEE ARTHROPLASTY;  Surgeon: Valeria Batman, MD;  Location: [facility] OR;  Service: Orthopedics;  Laterality: Right;     SOCIAL HISTORY:  Social History   Socioeconomic History  . Marital status: Media planner    Spouse name: Not on file  . Number of children: 2  . Years of education: 16  . Highest education level: Bachelor's degree (e.g., BA, AB, BS)  Occupational History  . Occupation: Retired    Comment: Energy manager  Tobacco Use  . Smoking status: Current Every Day Smoker    Packs/day: 0.25    Years: 55.00    Pack years: 13.75    Types: Cigarettes  . Smokeless tobacco: Never Used  Vaping Use  . Vaping Use: Never used  Substance and Sexual Activity  . Alcohol use: No  . Drug use: No  . Sexual activity: Not Currently  Other Topics Concern  . Not on file  Social History Narrative  . Not on file   Social Determinants of Health   Financial Resource Strain: Not on file  Food Insecurity: Not on file  Transportation Needs: Not on file  Physical Activity: Not on file  Stress: Not on file  Social Connections: Not on file  Intimate Partner Violence: Not on file    FAMILY HISTORY:  Family History  Adopted: Yes    CURRENT MEDICATIONS:  Outpatient Encounter Medications as of 03/17/2020  Medication Sig  . Budeson-Glycopyrrol-Formoterol (BREZTRI AEROSPHERE) 160-9-4.8 MCG/ACT AERO Inhale 2 puffs into the lungs in the morning and at  bedtime.  . Calcium Carb-Cholecalciferol (CALCIUM 600+D3 PO) Take 1 tablet by mouth daily.  . calcium carbonate (TUMS EX) 750 MG chewable tablet Chew 750-1,500 mg by mouth 3 (three) times daily as needed (for heartburn/indigestion.).  Marland Kitchen clotrimazole (MYCELEX) 10 MG troche Take 10 mg by mouth 5 (five) times daily as needed (thrush).  . Dextromethorphan-guaiFENesin (MUCINEX DM MAXIMUM STRENGTH) 60-1200 MG TB12 Take 1 tablet by mouth 2 (two) times daily as needed (pain).  . Ipratropium-Albuterol (COMBIVENT RESPIMAT) 20-100 MCG/ACT AERS respimat INHALE 1 PUFF INTO THE LUNGS EVERY 4 (FOUR) HOURS AS NEEDED (USE 4 TIMES DAILY) (Patient taking differently: Inhale 1 puff into the lungs every 4 (four) hours as needed for shortness of breath or wheezing.)  . Melatonin 5 MG TABS Take 5 mg by mouth at bedtime as needed (for sleep.).   Marland Kitchen Multiple Vitamin (MULTIVITAMIN WITH MINERALS) TABS tablet Take 1 tablet by mouth daily.  . naproxen sodium (ALEVE) 220 MG tablet Take 440 mg by mouth daily as needed (pain).  Marland Kitchen omeprazole (PRILOSEC) 20  MG capsule Take 1 capsule (20 mg total) by mouth 2 (two) times daily before a meal. Take 30 min before breakfast and 30 min before dinner  . OXYGEN Inhale 2 L into the lungs at bedtime. Bedtime and as needed  . triamcinolone cream (KENALOG) 0.1 % Apply 1 application topically 2 (two) times daily as needed (for skin irritation/contact dermatitis).  . vitamin B-12 (CYANOCOBALAMIN) 1000 MCG tablet Take 1,000 mcg by mouth daily.   No facility-administered encounter medications on file as of 03/17/2020.    ALLERGIES:  Allergies  Allergen Reactions  . Prolia [Denosumab] Other (See Comments)    Joint pain  . Anastrozole     Joint pain  . Letrozole     Joint pain  . Tamoxifen     Joint pain     PHYSICAL EXAM:  ECOG Performance status: 1  There were no vitals filed for this visit. There were no vitals filed for this visit.  Physical exam: -Limited secondary to telephone  visit -Patient speaking in full complete sentences.   LABORATORY DATA:  I have reviewed the labs as listed.  CBC    Component Value Date/Time   WBC 5.6 03/10/2020 1422   RBC 4.61 03/10/2020 1422   HGB 14.1 03/10/2020 1422   HGB 13.4 01/06/2020 1255   HCT 46.3 (H) 03/10/2020 1422   HCT 41.9 01/06/2020 1255   PLT 161 03/10/2020 1422   PLT 199 01/06/2020 1255   MCV 100.4 (H) 03/10/2020 1422   MCV 95 01/06/2020 1255   MCH 30.6 03/10/2020 1422   MCHC 30.5 03/10/2020 1422   RDW 14.1 03/10/2020 1422   RDW 13.0 01/06/2020 1255   LYMPHSABS 2.2 03/10/2020 1422   LYMPHSABS 2.4 01/06/2020 1255   MONOABS 0.4 03/10/2020 1422   EOSABS 0.2 03/10/2020 1422   EOSABS 0.2 01/06/2020 1255   BASOSABS 0.1 03/10/2020 1422   BASOSABS 0.0 01/06/2020 1255   CMP Latest Ref Rng & Units 03/10/2020 01/06/2020 08/29/2019  Glucose 70 - 99 mg/dL 159(H) 85 111(H)  BUN 8 - 23 mg/dL _0 Creatinine 0.44 - 1.00 mg/dL 0.75 0.65 0.63  Sodium 135 - 145 mmol/L 139 143 137  Potassium 3.5 - 5.1 mmol/L 3.9 4.3 3.8  Chloride 98 - 111 mmol/L 99 102 98  CO2 22 - 32 mmol/L 32 32(H) 30  Calcium 8.9 - 10.3 mg/dL 9.0 9.2 9.0  Total Protein 6.5 - 8.1 g/dL 7.2 6.7 7.1  Total Bilirubin 0.3 - 1.2 mg/dL 0.5 0.3 0.4  Alkaline Phos 38 - 126 U/L 86 112 85  AST 15 - 41 U/L 14(L) 12 16  ALT 0 - 44 U/L _1 DIAGNOSTIC IMAGING:  I have independently reviewed the CT scans and discussed with the patient.  ASSESSMENT & PLAN:  1.  Stage Ia left breast cancer: -Mammogram on 12/26/2017 showed suspicious 9 mm mass in the upper outer quadrant of the left breast.  Solitary borderline left axillary lymph node with cortical thickness of 4 mm. -Biopsy on 01/02/2018 of the left breast 2 o'clock position consistent with invasive ductal carcinoma, grade 1-2.  Left axillary lymph node was negative for carcinoma. -Left breast lumpectomy and sentinel lymph node biopsy on 03/05/2018 showed 1.2 cm IDC, margins negative, grade 1, 0/2 lymph  nodes involved.  ER/PR positive, HER-2 negative, Ki-67 2%. -Anastrozole was started in February 2020.  However she had developed joint pains.  It was switched to letrozole on 05/22/2018. -She continues with significant joint  pain.  She was then switched to tamoxifen in November 2020.  She continues to report significant joint pain that is affecting her everyday living. -She is stopped taking tamoxifen.  Risk versus benefits were discussed with the patient. -Last mammogram done on 01/07/20 was negative for malignancy. -Labs done on 03/10/20 were all WNL. -She will return to the clinic in 6 months with repeat labs.  2.  Bone density: -DEXA scan on 08/01/2018 showed a T score of -2.9 osteoporosis. -Vitamin D level on 08/29/2019 was 23.21 -Prolia started on 08/09/2018. -Prolia discontinued secondary to muscle aches and joint pain. -She will continue taking calcium and vitamin D daily -Repeat DEXA scan in June 2022.  Orders placed.  3.  Smoking history: -Patient started smoking at age 28.  At her peak she smoked 2 packs/day for 5 years.  She now smokes a half a pack per day. -Low-dose CT scan done on 08/29/2019 showed lung RADS category 3.  -She has repeat CT scan next month. -We will call patient with results from CT scan.  Disposition: -RTC in 6 months with repeat labs and to review mammogram and DEXA scan. -Needs mammogram and DEXA scan scheduled. -We will call with results from CT scan of her lungs for smoking history.  Follow-up for osteoporosis and No problem-specific Assessment & Plan notes found for this encounter.  I provided 15 minutes of non face-to-face telephone visit time during this encounter, and > 50% was spent counseling as documented under my assessment & plan.    Orders placed this encounter:  Orders Placed This Encounter  Procedures  . DG Bone Density  . MM DIAG BREAST TOMO BILATERAL    Faythe Casa, NP 03/18/2020 9:01 AM  [place] [phone]

## 2020-03-26 ENCOUNTER — Ambulatory Visit (HOSPITAL_COMMUNITY): Admission: RE | Admit: 2020-03-26 | Payer: Medicare Other | Source: Ambulatory Visit

## 2020-03-26 ENCOUNTER — Encounter (HOSPITAL_COMMUNITY): Payer: Self-pay

## 2020-03-30 DIAGNOSIS — J449 Chronic obstructive pulmonary disease, unspecified: Secondary | ICD-10-CM | POA: Diagnosis not present

## 2020-04-02 ENCOUNTER — Other Ambulatory Visit (HOSPITAL_COMMUNITY): Payer: Self-pay

## 2020-04-02 DIAGNOSIS — J449 Chronic obstructive pulmonary disease, unspecified: Secondary | ICD-10-CM

## 2020-04-02 MED ORDER — COMBIVENT RESPIMAT 20-100 MCG/ACT IN AERS: INHALATION_SPRAY | RESPIRATORY_TRACT | 2 refills | Status: DC

## 2020-04-03 ENCOUNTER — Other Ambulatory Visit: Payer: Self-pay

## 2020-04-03 ENCOUNTER — Ambulatory Visit (HOSPITAL_COMMUNITY)
Admission: RE | Admit: 2020-04-03 | Discharge: 2020-04-03 | Disposition: A | Discharge status: Home / Self Care | Payer: Medicare Other | Source: Ambulatory Visit | Attending: Oncology | Admitting: Oncology

## 2020-04-03 DIAGNOSIS — J439 Emphysema, unspecified: Secondary | ICD-10-CM | POA: Diagnosis not present

## 2020-04-03 DIAGNOSIS — R911 Solitary pulmonary nodule: Secondary | ICD-10-CM | POA: Diagnosis not present

## 2020-04-03 DIAGNOSIS — I2510 Atherosclerotic heart disease of native coronary artery without angina pectoris: Secondary | ICD-10-CM | POA: Diagnosis not present

## 2020-04-03 DIAGNOSIS — F17210 Nicotine dependence, cigarettes, uncomplicated: Secondary | ICD-10-CM | POA: Insufficient documentation

## 2020-04-03 DIAGNOSIS — I700 Atherosclerosis of aorta: Secondary | ICD-10-CM | POA: Diagnosis not present

## 2020-04-03 DIAGNOSIS — J8410 Pulmonary fibrosis, unspecified: Secondary | ICD-10-CM | POA: Diagnosis not present

## 2020-04-03 DIAGNOSIS — Z122 Encounter for screening for malignant neoplasm of respiratory organs: Secondary | ICD-10-CM | POA: Diagnosis not present

## 2020-04-06 ENCOUNTER — Encounter (HOSPITAL_COMMUNITY): Payer: Self-pay

## 2020-04-06 NOTE — Progress Notes (Signed)
Patient notified of LDCT Lung Cancer Screening Results via mail with the recommendation to follow-up in 12 months. Patient's referring provider has been sent a copy of results. Results are as follows:  IMPRESSION: 1. Lung-RADS 2, benign appearance or behavior. 2. New small focus of tree-in-bud nodularity medial left lung apex, likely reflecting atypical infection (including MAI). 3. Aortic Atherosclerosis (ICD10-I70.0) and Emphysema (ICD10-J43.9).

## 2020-04-07 ENCOUNTER — Encounter: Payer: Self-pay | Admitting: Family Medicine

## 2020-04-07 DIAGNOSIS — I700 Atherosclerosis of aorta: Secondary | ICD-10-CM | POA: Insufficient documentation

## 2020-04-12 DIAGNOSIS — J449 Chronic obstructive pulmonary disease, unspecified: Secondary | ICD-10-CM | POA: Diagnosis not present

## 2020-04-27 DIAGNOSIS — J449 Chronic obstructive pulmonary disease, unspecified: Secondary | ICD-10-CM | POA: Diagnosis not present

## 2020-04-29 ENCOUNTER — Ambulatory Visit: Payer: Medicare Other | Admitting: Pulmonary Disease

## 2020-05-10 DIAGNOSIS — J449 Chronic obstructive pulmonary disease, unspecified: Secondary | ICD-10-CM | POA: Diagnosis not present

## 2020-05-25 ENCOUNTER — Other Ambulatory Visit: Payer: Self-pay

## 2020-05-25 ENCOUNTER — Ambulatory Visit: Payer: Medicare Other | Admitting: Pulmonary Disease

## 2020-05-25 ENCOUNTER — Encounter: Payer: Self-pay | Admitting: Pulmonary Disease

## 2020-05-25 VITALS — BP 140/80 | HR 85 | Temp 97.1°F | Ht 65.0 in | Wt 177.4 lb

## 2020-05-25 DIAGNOSIS — J9611 Chronic respiratory failure with hypoxia: Secondary | ICD-10-CM | POA: Diagnosis not present

## 2020-05-25 DIAGNOSIS — J449 Chronic obstructive pulmonary disease, unspecified: Secondary | ICD-10-CM | POA: Diagnosis not present

## 2020-05-25 DIAGNOSIS — Z720 Tobacco use: Secondary | ICD-10-CM | POA: Diagnosis not present

## 2020-05-25 NOTE — Progress Notes (Signed)
[facility]  Chief Complaint  Patient presents with  . Follow-up    No complaints currently    Constitutional:  BP 140/80 (BP Location: Left Arm, Cuff Size: Normal)   Pulse 85   Temp (!) 97.1 F (36.2 C) (Other (Comment)) Comment (Src): wrist  Ht 5\' 5"  (1.651 m)   Wt 177 lb 6.4 oz (80.5 kg)   SpO2 92% Comment: Room air  BMI 29.52 kg/m   Past Medical History:  Lt breast cancer s/p lumpectomy January 2020, Osteoporosis, HLD, Pneumonia  Past Surgical History:  She  has a past surgical history that includes Cesarean section; Knee surgery (Right); Tonsillectomy; Breast surgery; Colonoscopy; Total knee arthroplasty (Right, 06/27/2017); Breast lumpectomy with radioactive seed and sentinel lymph node biopsy (Left, 03/05/2018); Eye surgery (Bilateral); Esophagogastroduodenoscopy (egd) with propofol (N/A, 03/03/2020); Balloon dilation (N/A, 03/03/2020); biopsy (03/03/2020); and Esophageal brushing (03/03/2020).  Brief Summary:  Joanne Kramer is a 75 y.o. female smoker with COPD and chronic respiratory failure.      Subjective:   She has more good days than bad.  Humidity and pollen cause more congestion.  Using breztri bid and combivent bid.  She didn't realize she could use coupons for inhalers.  She uses 2 liters oxygen at night and with exertion.  Got a pulsed POC from [facility], but she is a mouth breather.  She is thinking about getting an inogen device.  LDCT chest from February showed faint tree in bud of uncertain significance.  Otherwise stable.  She has set quit date for April 1.  Physical Exam:   Appearance - well kempt   ENMT - no sinus tenderness, no oral exudate, no LAN, Mallampati 3 airway, no stridor  Respiratory - equal breath sounds bilaterally, no wheezing or rales  CV - s1s2 regular rate and rhythm, no murmurs  Ext - no clubbing, no edema  Skin - no rashes  Psych - normal mood and affect   Pulmonary testing:    PFT 08/20/19 >> FEV1 0.67 (29%), FEV1% 42, TLC 5.79 (111%), RV 4.08 (179%), DLCO 45%  Chest Imaging:   LDCT 08/29/19 >> coronary calcification, 4 cm ascending aorta, centrilobular emphysema, innumerable tiny upper lung GGO, calcified granuloma RUL, 6.6 mm nodule RLL  LDCT 04/06/20 >> small foci of tree in bud in Lt apex, otherwise no change  Social History:  She  reports that she has been smoking cigarettes. She has a 27.50 pack-year smoking history. She has never used smokeless tobacco. She reports that she does not drink alcohol and does not use drugs.  Family History:  Her family history is not on file. She was adopted.     Assessment/Plan:   COPD with chronic bronchitis and emphysema. - continue breztri bid and combivent bid - she will call if she finds that albuterol is less expensive than combivent  Chronic respiratory failure with hypoxia. - uses [employer] for DME - 2 liters oxygen with exertion and sleep - she needs to use continuous flow oxygen, and is waiting to hear from [facility] about getting different POC; otherwise she might purchase an inogen device  Tobacco abuse. - intolerant of chantix due to nausea - she has set quit date for April 1  Lung nodules. - she will need f/u LDCT chest in February 2023  Time Spent Involved in Patient Care on Day of Examination:  31 minutes  Follow up:  Patient Instructions  Follow up in 1 year   Medication List:  Allergies as of 05/25/2020      Reactions   Prolia [denosumab] Other (See Comments)   Joint pain   Anastrozole    Joint pain   Letrozole    Joint pain   Tamoxifen    Joint pain      Medication List       Accurate as of May 25, 2020 10:36 AM. If you have any questions, ask your nurse or doctor.        Breztri Aerosphere 160-9-4.8 MCG/ACT Aero Generic drug: Budeson-Glycopyrrol-Formoterol Inhale 2 puffs into the lungs in the morning and at bedtime.   CALCIUM 600+D3 PO Take 1 tablet by mouth  daily.   calcium carbonate 750 MG chewable tablet Commonly known as: TUMS EX Chew 750-1,500 mg by mouth 3 (three) times daily as needed (for heartburn/indigestion.).   clotrimazole 10 MG troche Commonly known as: MYCELEX Take 10 mg by mouth 5 (five) times daily as needed (thrush).   Combivent Respimat 20-100 MCG/ACT Aers respimat Generic drug: Ipratropium-Albuterol INHALE 1 PUFF INTO THE LUNGS EVERY 4 (FOUR) HOURS AS NEEDED (USE 4 TIMES DAILY)   melatonin 5 MG Tabs Take 5 mg by mouth at bedtime as needed (for sleep.).   Mucinex DM Maximum Strength 60-1200 MG Tb12 Take 1 tablet by mouth 2 (two) times daily as needed (pain).   multivitamin with minerals Tabs tablet Take 1 tablet by mouth daily.   naproxen sodium 220 MG tablet Commonly known as: ALEVE Take 440 mg by mouth daily as needed (pain).   omeprazole 20 MG capsule Commonly known as: PRILOSEC Take 1 capsule (20 mg total) by mouth 2 (two) times daily before a meal. Take 30 min before breakfast and 30 min before dinner   OXYGEN Inhale 2 L into the lungs at bedtime. Bedtime and as needed   triamcinolone 0.1 % Commonly known as: KENALOG Apply 1 application topically 2 (two) times daily as needed (for skin irritation/contact dermatitis).   vitamin B-12 1000 MCG tablet Commonly known as: CYANOCOBALAMIN Take 1,000 mcg by mouth daily.       Signature:  Chesley Mires, MD [place] Pager - [phone] 05/25/2020, 10:36 AM

## 2020-05-25 NOTE — Patient Instructions (Signed)
Follow up in 1 year.

## 2020-05-28 DIAGNOSIS — J449 Chronic obstructive pulmonary disease, unspecified: Secondary | ICD-10-CM | POA: Diagnosis not present

## 2020-06-01 ENCOUNTER — Ambulatory Visit: Payer: Medicare Other | Admitting: Gastroenterology

## 2020-06-01 ENCOUNTER — Encounter: Payer: Self-pay | Admitting: Gastroenterology

## 2020-06-01 ENCOUNTER — Other Ambulatory Visit: Payer: Self-pay

## 2020-06-01 DIAGNOSIS — R1310 Dysphagia, unspecified: Secondary | ICD-10-CM | POA: Diagnosis not present

## 2020-06-01 DIAGNOSIS — R6889 Other general symptoms and signs: Secondary | ICD-10-CM

## 2020-06-01 NOTE — Patient Instructions (Signed)
1. We have made a referral for you to see ENT for your throat symptoms. If nothing found on their exam, we could consider swallowing study as next step.

## 2020-06-01 NOTE — Progress Notes (Signed)
Primary Care Physician: Claretta Fraise, MD  Primary Gastroenterologist:  Elon Alas. Abbey Chatters, DO   Chief Complaint  Patient presents with  . pp f/u    Difficulty with swallowing some pills. Feels like something is stuck in throat    HPI: Joanne Kramer is a 75 y.o. female here for follow-up of dysphagia.  Patient seen back in December with complaints.  Symptoms occurring for 6 months.  Progressively worsening.  Primarily complains of problems swallowing pills.    Completed EGD, see below for details.  Patient had LA grade a reflux esophagitis and gastritis, multiple Schatzki ring status post dilation.  Took omeprazole over-the-counter for 1 month.  No noted improvement in any of her symptoms.  Denies history of heartburn.  No abdominal pain.  Esophageal dilation did not help any of her throat symptoms.  Feels like something in the back of throat on the right hand side.  Sometimes this area feels scratchy.  Feels like there may be a foreign body there.  Also wonders if it is a growth.  Feels like she can almost reach it with her finger but she has a bad gag reflex so she does not try hard.  Feels like it is at the base of her tongue on the right side.  States she is able to move it around by moving her tongue.  Makes it difficult to swallow pills.  Has converted everything that she can to the chewable or liquid form.  Notes that with swallowing saliva.  No problems swallowing solid foods or liquids. Weight is up 12 pounds over the past 6 months.    EGD 02/2020 -LA Grade A reflux esophagitis with no bleeding. -Multiple Schatzki ring. Dilated. -Gastritis. Biopsied (nonspecific reactive gastropathy, negative for H. pylori). -Normal duodenal bulb, first portion of the duodenum and second portion of the duodenum. Biopsied (duodenal mucosa with mild nonspecific reactive changes, negative for increased intraepithelial lymphocytes or villous architectural changes). -Cells for cytology obtained  in the middle third of the esophagus (negative for yeast).   Current Outpatient Medications  Medication Sig Dispense Refill  . Budeson-Glycopyrrol-Formoterol (BREZTRI AEROSPHERE) 160-9-4.8 MCG/ACT AERO Inhale 2 puffs into the lungs in the morning and at bedtime. 10.7 g 0  . Calcium Carb-Cholecalciferol (CALCIUM 600+D3 PO) Take 1 tablet by mouth daily.    . calcium carbonate (TUMS EX) 750 MG chewable tablet Chew 750-1,500 mg by mouth 3 (three) times daily as needed (for heartburn/indigestion.).    Marland Kitchen clotrimazole (MYCELEX) 10 MG troche Take 10 mg by mouth 5 (five) times daily as needed (thrush).    . Dextromethorphan-guaiFENesin (MUCINEX DM MAXIMUM STRENGTH) 60-1200 MG TB12 Take 1 tablet by mouth 2 (two) times daily as needed (pain).    . Ipratropium-Albuterol (COMBIVENT RESPIMAT) 20-100 MCG/ACT AERS respimat INHALE 1 PUFF INTO THE LUNGS EVERY 4 (FOUR) HOURS AS NEEDED (USE 4 TIMES DAILY) 4 g 2  . Melatonin 5 MG TABS Take 5 mg by mouth at bedtime as needed (for sleep.).     Marland Kitchen Multiple Vitamin (MULTIVITAMIN WITH MINERALS) TABS tablet Take 1 tablet by mouth daily.    . naproxen sodium (ALEVE) 220 MG tablet Take 440 mg by mouth daily as needed (pain).    Marland Kitchen omeprazole (PRILOSEC) 20 MG capsule Take 1 capsule (20 mg total) by mouth 2 (two) times daily before a meal. Take 30 min before breakfast and 30 min before dinner 180 capsule 1  . OXYGEN Inhale 2 L into the lungs at bedtime.  Bedtime and as needed    . triamcinolone cream (KENALOG) 0.1 % Apply 1 application topically 2 (two) times daily as needed (for skin irritation/contact dermatitis). 453.6 g 1  . vitamin B-12 (CYANOCOBALAMIN) 1000 MCG tablet Take 1,000 mcg by mouth daily.     No current facility-administered medications for this visit.    Allergies as of 06/01/2020 - Review Complete 06/01/2020  Allergen Reaction Noted  . Prolia [denosumab] Other (See Comments) 07/08/2019  . Anastrozole  08/07/2019  . Letrozole  08/07/2019  . Tamoxifen   08/07/2019    ROS:  General: Negative for anorexia, weight loss, fever, chills, fatigue, weakness. ENT: Negative for hoarseness, difficulty swallowing , nasal congestion.see hpi CV: Negative for chest pain, angina, palpitations, dyspnea on exertion, peripheral edema.  Respiratory: Negative for dyspnea at rest, dyspnea on exertion, cough, sputum, wheezing.  GI: See history of present illness. GU:  Negative for dysuria, hematuria, urinary incontinence, urinary frequency, nocturnal urination.  Endo: Negative for unusual weight change.    Physical Examination:   BP 128/81   Pulse 84   Temp (!) 97.3 F (36.3 C) (Temporal)   Ht 5\' 5"  (1.651 m)   Wt 177 lb 12.8 oz (80.6 kg)   BMI 29.59 kg/m   General: Well-nourished, well-developed in no acute distress.  Eyes: No icterus. Mouth: Oropharyngeal mucosa moist and pink , no lesions erythema or exudate. Not notable masses or lesions.  Lungs: Clear to auscultation bilaterally.  Heart: Regular rate and rhythm, no murmurs rubs or gallops.  Abdomen: Bowel sounds are normal, nontender, nondistended, no hepatosplenomegaly or masses, no abdominal bruits or hernia , no rebound or guarding.   Extremities: No lower extremity edema. No clubbing or deformities. Neuro: Alert and oriented x 4   Skin: Warm and dry, no jaundice.   Psych: Alert and cooperative, normal mood and affect.  Labs:  Lab Results  Component Value Date   CREATININE 0.75 03/10/2020   BUN 12 03/10/2020   NA 139 03/10/2020   K 3.9 03/10/2020   CL 99 03/10/2020   CO2 32 03/10/2020   Lab Results  Component Value Date   ALT 13 03/10/2020   AST 14 (L) 03/10/2020   ALKPHOS 86 03/10/2020   BILITOT 0.5 03/10/2020   Lab Results  Component Value Date   WBC 5.6 03/10/2020   HGB 14.1 03/10/2020   HCT 46.3 (H) 03/10/2020   MCV 100.4 (H) 03/10/2020   PLT 161 03/10/2020   No results found for: IRON, TIBC, FERRITIN Lab Results  Component Value Date   DGUYQIHK74 259 03/10/2020    Lab Results  Component Value Date   FOLATE 15.8 03/10/2020     Imaging Studies: No results found.  Assessment:  Pleasant 75 y/o female presenting for follow up of throat symptoms, difficulty swallowing pills. Denies dysphagia to solids, liquids.   Recent EGD with reflux esophagitis (LA Grade A), gastritis, multiple Schatki rings s/p dilation. Symptoms did not improve with PPI or esophageal dilation. Denies typical heartburn symptoms or epigastric pain.   Patient feels her symptoms are isolated to her throat. She feels like something is in the back of her throat on the right but feels as if she is able to move it to the left side at times with moving her tongue. Symptoms worse with swallowing. Feels irritation or scratchy at times. She is concerned about a foreign body or malignancy. Given her symptoms, would recommend ENT evaluation of her oropharynx. Patient agreeable.   1. Patient does not want  to continue chronic PPI therapy. If she develops heartburn, abdominal pain, would consider resuming PPI.  2. ENT referral. If nothing found, could consider barium swallow to make sure Schatzki rings adequately dilated and no evidence of Zenker's diverticulum.

## 2020-06-01 NOTE — Progress Notes (Signed)
Cc'ed to pcp °

## 2020-06-02 ENCOUNTER — Other Ambulatory Visit: Payer: Self-pay | Admitting: *Deleted

## 2020-06-02 DIAGNOSIS — R6889 Other general symptoms and signs: Secondary | ICD-10-CM

## 2020-06-02 DIAGNOSIS — R1310 Dysphagia, unspecified: Secondary | ICD-10-CM

## 2020-06-10 DIAGNOSIS — J449 Chronic obstructive pulmonary disease, unspecified: Secondary | ICD-10-CM | POA: Diagnosis not present

## 2020-06-27 DIAGNOSIS — J449 Chronic obstructive pulmonary disease, unspecified: Secondary | ICD-10-CM | POA: Diagnosis not present

## 2020-06-29 ENCOUNTER — Other Ambulatory Visit (HOSPITAL_COMMUNITY): Payer: Self-pay | Admitting: Hematology

## 2020-06-29 DIAGNOSIS — J449 Chronic obstructive pulmonary disease, unspecified: Secondary | ICD-10-CM

## 2020-07-02 DIAGNOSIS — K219 Gastro-esophageal reflux disease without esophagitis: Secondary | ICD-10-CM | POA: Diagnosis not present

## 2020-07-02 DIAGNOSIS — R070 Pain in throat: Secondary | ICD-10-CM | POA: Diagnosis not present

## 2020-07-07 DIAGNOSIS — J449 Chronic obstructive pulmonary disease, unspecified: Secondary | ICD-10-CM

## 2020-07-07 DIAGNOSIS — R0902 Hypoxemia: Secondary | ICD-10-CM

## 2020-07-07 NOTE — Telephone Encounter (Signed)
Dr Halford Chessman,   We received this mychart message from patient. Please advise.  We had spoken about this issue back in March when I saw you.  I thought everything was all set with [facility], but it is not.  Apparently they need a new a new order from you requesting a portable concentrator that is both pulse & continuous (the one I found is called [employer]).  They also need an order from you for them to continue providing supplies to me.    I am very frustrated with [facility] as my first contact with them regarding this issue was back in late December.  I just found out today that they needed new orders from you and it was my resonsibility to request them.    Is there a different medical equipment provider that you could recommend?  I am going on vacation in July and had hoped to have the new portable by that time.   Thanks!  Joanne Kramer

## 2020-07-07 NOTE — Telephone Encounter (Signed)
Order placed per Dr Halford Chessman: Please send new order to [facility] to arrange for POC at 2 liters to be used with exertion and sleep. Notified patient via mychart.  Nothing further needed at this time.

## 2020-07-08 ENCOUNTER — Telehealth: Payer: Self-pay | Admitting: Pulmonary Disease

## 2020-07-08 NOTE — Telephone Encounter (Signed)
Order was placed yesterday for [facility] to arrange a POC at 2 liters to be used with exertion and sleep.  I received a response back from [place] at [facility] this morning that states pt already has a POC and these are not to be used during sleep and asked for [place] to advise.

## 2020-07-08 NOTE — Telephone Encounter (Signed)
Dr Halford Chessman please advise to [facility] telephone note from today (07/08/20), this in response to mychart message from patient yesterday (07/07/20) and the POC order placed.

## 2020-07-08 NOTE — Telephone Encounter (Signed)
Called and spoke with Joanne Kramer at [facility] about orders and what exactly is needed for patient. Meyer stated she will reach out to the patient tomorrow and will let us know if any new orders need to be placed. Will route to Dr Halford Chessman to update.

## 2020-07-08 NOTE — Telephone Encounter (Signed)
They need to set her up with a stationary concentrator also that she would use at night.

## 2020-07-10 DIAGNOSIS — J449 Chronic obstructive pulmonary disease, unspecified: Secondary | ICD-10-CM | POA: Diagnosis not present

## 2020-07-28 DIAGNOSIS — J449 Chronic obstructive pulmonary disease, unspecified: Secondary | ICD-10-CM | POA: Diagnosis not present

## 2020-08-04 ENCOUNTER — Other Ambulatory Visit: Payer: Self-pay | Admitting: Pulmonary Disease

## 2020-08-10 DIAGNOSIS — J449 Chronic obstructive pulmonary disease, unspecified: Secondary | ICD-10-CM | POA: Diagnosis not present

## 2020-08-25 ENCOUNTER — Other Ambulatory Visit (HOSPITAL_COMMUNITY): Payer: Self-pay | Admitting: Oncology

## 2020-08-25 DIAGNOSIS — Z9889 Other specified postprocedural states: Secondary | ICD-10-CM

## 2020-08-27 DIAGNOSIS — J449 Chronic obstructive pulmonary disease, unspecified: Secondary | ICD-10-CM | POA: Diagnosis not present

## 2020-09-09 DIAGNOSIS — J449 Chronic obstructive pulmonary disease, unspecified: Secondary | ICD-10-CM | POA: Diagnosis not present

## 2020-09-14 ENCOUNTER — Telehealth: Payer: Self-pay | Admitting: Gastroenterology

## 2020-09-14 NOTE — Telephone Encounter (Signed)
Can we get copy of ENT records? I referred her after 05/2020 ov with me.

## 2020-09-18 ENCOUNTER — Other Ambulatory Visit (HOSPITAL_COMMUNITY): Payer: Self-pay | Admitting: Otolaryngology

## 2020-09-18 DIAGNOSIS — R070 Pain in throat: Secondary | ICD-10-CM

## 2020-09-18 DIAGNOSIS — K219 Gastro-esophageal reflux disease without esophagitis: Secondary | ICD-10-CM | POA: Diagnosis not present

## 2020-09-18 NOTE — Telephone Encounter (Signed)
I faxed a request for records to Dr Deeann Saint office.

## 2020-09-21 ENCOUNTER — Other Ambulatory Visit (HOSPITAL_COMMUNITY): Payer: Self-pay | Admitting: Surgery

## 2020-09-21 DIAGNOSIS — M810 Age-related osteoporosis without current pathological fracture: Secondary | ICD-10-CM

## 2020-09-21 DIAGNOSIS — C50412 Malignant neoplasm of upper-outer quadrant of left female breast: Secondary | ICD-10-CM

## 2020-09-21 DIAGNOSIS — Z170 Estrogen receptor positive status [ER+]: Secondary | ICD-10-CM

## 2020-09-21 DIAGNOSIS — J449 Chronic obstructive pulmonary disease, unspecified: Secondary | ICD-10-CM

## 2020-09-22 ENCOUNTER — Ambulatory Visit (HOSPITAL_COMMUNITY): Payer: Medicare Other

## 2020-09-22 ENCOUNTER — Other Ambulatory Visit (HOSPITAL_COMMUNITY): Payer: Medicare Other

## 2020-09-22 ENCOUNTER — Inpatient Hospital Stay (HOSPITAL_COMMUNITY): Payer: Medicare Other | Attending: Hematology

## 2020-09-22 ENCOUNTER — Encounter (HOSPITAL_COMMUNITY): Payer: Medicare Other

## 2020-09-27 DIAGNOSIS — J449 Chronic obstructive pulmonary disease, unspecified: Secondary | ICD-10-CM | POA: Diagnosis not present

## 2020-09-29 ENCOUNTER — Ambulatory Visit (HOSPITAL_COMMUNITY): Payer: Medicare Other | Admitting: Hematology

## 2020-10-10 DIAGNOSIS — J449 Chronic obstructive pulmonary disease, unspecified: Secondary | ICD-10-CM | POA: Diagnosis not present

## 2020-10-13 ENCOUNTER — Other Ambulatory Visit: Payer: Self-pay

## 2020-10-13 ENCOUNTER — Encounter (HOSPITAL_COMMUNITY): Payer: Self-pay | Admitting: Radiology

## 2020-10-13 ENCOUNTER — Ambulatory Visit (HOSPITAL_COMMUNITY)
Admission: RE | Admit: 2020-10-13 | Discharge: 2020-10-13 | Disposition: A | Discharge status: Home / Self Care | Payer: Medicare Other | Source: Ambulatory Visit | Attending: Otolaryngology | Admitting: Otolaryngology

## 2020-10-13 DIAGNOSIS — M47812 Spondylosis without myelopathy or radiculopathy, cervical region: Secondary | ICD-10-CM | POA: Diagnosis not present

## 2020-10-13 DIAGNOSIS — R070 Pain in throat: Secondary | ICD-10-CM | POA: Diagnosis not present

## 2020-10-13 DIAGNOSIS — E049 Nontoxic goiter, unspecified: Secondary | ICD-10-CM | POA: Diagnosis not present

## 2020-10-13 DIAGNOSIS — R1310 Dysphagia, unspecified: Secondary | ICD-10-CM | POA: Diagnosis not present

## 2020-10-13 LAB — POCT I-STAT CREATININE: Creatinine, Ser: 0.6 mg/dL (ref 0.44–1.00)

## 2020-10-13 MED ORDER — IOHEXOL 350 MG/ML SOLN
100.0000 mL | Freq: Once | INTRAVENOUS | Status: AC | PRN
  Administered 2020-10-13: 60 mL via INTRAVENOUS

## 2020-10-14 NOTE — Telephone Encounter (Signed)
Reviewed ENT report from July 2022:  Patient evaluated for foreign body sensation in her throat.  Completed flexible fiberoptic laryngoscopy that showed moderate posterior laryngeal edema consistent with laryngeal reflux.  ENT started famotidine 20 mg at bedtime.  Patient previously declined chronic PPI therapy.  Yesterday she also completed CT neck ordered by ENT, Dr. Benjamine Mola.  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  Also with enlarged thyroid with subcentimeter nodules for which no ultrasound follow-up recommended based on current guidelines.

## 2020-10-20 ENCOUNTER — Ambulatory Visit (HOSPITAL_COMMUNITY)
Admission: RE | Admit: 2020-10-20 | Discharge: 2020-10-20 | Disposition: A | Discharge status: Home / Self Care | Payer: Medicare Other | Source: Ambulatory Visit | Attending: Oncology | Admitting: Oncology

## 2020-10-20 ENCOUNTER — Other Ambulatory Visit: Payer: Self-pay

## 2020-10-20 DIAGNOSIS — C50412 Malignant neoplasm of upper-outer quadrant of left female breast: Secondary | ICD-10-CM

## 2020-10-20 DIAGNOSIS — M85832 Other specified disorders of bone density and structure, left forearm: Secondary | ICD-10-CM | POA: Diagnosis not present

## 2020-10-20 DIAGNOSIS — Z170 Estrogen receptor positive status [ER+]: Secondary | ICD-10-CM | POA: Insufficient documentation

## 2020-10-20 DIAGNOSIS — Z9889 Other specified postprocedural states: Secondary | ICD-10-CM

## 2020-10-20 DIAGNOSIS — M810 Age-related osteoporosis without current pathological fracture: Secondary | ICD-10-CM | POA: Diagnosis not present

## 2020-10-21 NOTE — Progress Notes (Signed)
He Dr. Raliegh Ip, this patient has a T score of -3.0 previously -2.9.  She did not tolerate Prolia very well and stopped that a while back.  She is taking calcium and vitamin D.  She is currently not on an AI or tamoxifen due to joint pain and she is aware of the risk of not being on either of these medications.  Wanted to give you a heads up.  Believes she has a mammogram scheduled in 2 months or so and she will see you back after that.  Faythe Casa, NP 10/21/2020 12:38 PM

## 2020-10-27 DIAGNOSIS — R070 Pain in throat: Secondary | ICD-10-CM | POA: Diagnosis not present

## 2020-10-27 DIAGNOSIS — K219 Gastro-esophageal reflux disease without esophagitis: Secondary | ICD-10-CM | POA: Diagnosis not present

## 2020-10-28 DIAGNOSIS — J449 Chronic obstructive pulmonary disease, unspecified: Secondary | ICD-10-CM | POA: Diagnosis not present

## 2020-11-10 DIAGNOSIS — J449 Chronic obstructive pulmonary disease, unspecified: Secondary | ICD-10-CM | POA: Diagnosis not present

## 2020-11-19 DIAGNOSIS — R1310 Dysphagia, unspecified: Secondary | ICD-10-CM | POA: Diagnosis not present

## 2020-11-19 DIAGNOSIS — J387 Other diseases of larynx: Secondary | ICD-10-CM | POA: Diagnosis not present

## 2020-11-19 DIAGNOSIS — Z888 Allergy status to other drugs, medicaments and biological substances status: Secondary | ICD-10-CM | POA: Diagnosis not present

## 2020-11-19 DIAGNOSIS — Z87891 Personal history of nicotine dependence: Secondary | ICD-10-CM | POA: Diagnosis not present

## 2020-11-27 DIAGNOSIS — J449 Chronic obstructive pulmonary disease, unspecified: Secondary | ICD-10-CM | POA: Diagnosis not present

## 2020-12-10 DIAGNOSIS — R1310 Dysphagia, unspecified: Secondary | ICD-10-CM | POA: Diagnosis not present

## 2020-12-10 DIAGNOSIS — R633 Feeding difficulties, unspecified: Secondary | ICD-10-CM | POA: Diagnosis not present

## 2020-12-10 DIAGNOSIS — J449 Chronic obstructive pulmonary disease, unspecified: Secondary | ICD-10-CM | POA: Diagnosis not present

## 2020-12-10 DIAGNOSIS — R1312 Dysphagia, oropharyngeal phase: Secondary | ICD-10-CM | POA: Diagnosis not present

## 2020-12-25 ENCOUNTER — Telehealth: Payer: Self-pay | Admitting: Family Medicine

## 2020-12-25 NOTE — Telephone Encounter (Signed)
Left message for patient to call back and schedule Medicare Annual Wellness Visit (AWV) to be completed by video or phone.   Last AWV: 12/27/2018  Please schedule at anytime with [facility].  45 minute appointment  Any questions, please contact me at [phone]

## 2020-12-28 DIAGNOSIS — J449 Chronic obstructive pulmonary disease, unspecified: Secondary | ICD-10-CM | POA: Diagnosis not present

## 2021-01-04 ENCOUNTER — Other Ambulatory Visit (HOSPITAL_COMMUNITY): Payer: Self-pay | Admitting: Hematology

## 2021-01-04 DIAGNOSIS — J449 Chronic obstructive pulmonary disease, unspecified: Secondary | ICD-10-CM

## 2021-01-05 ENCOUNTER — Encounter (HOSPITAL_COMMUNITY): Payer: Self-pay | Admitting: Hematology

## 2021-01-10 DIAGNOSIS — J449 Chronic obstructive pulmonary disease, unspecified: Secondary | ICD-10-CM | POA: Diagnosis not present

## 2021-01-12 ENCOUNTER — Ambulatory Visit (HOSPITAL_COMMUNITY): Payer: Medicare Other | Admitting: Hematology

## 2021-01-12 ENCOUNTER — Ambulatory Visit (HOSPITAL_COMMUNITY): Admission: RE | Admit: 2021-01-12 | Payer: Medicare Other | Source: Ambulatory Visit

## 2021-01-12 ENCOUNTER — Inpatient Hospital Stay (HOSPITAL_COMMUNITY): Payer: Medicare Other | Attending: Hematology

## 2021-01-12 ENCOUNTER — Other Ambulatory Visit: Payer: Self-pay

## 2021-01-12 ENCOUNTER — Ambulatory Visit (HOSPITAL_COMMUNITY)
Admission: RE | Admit: 2021-01-12 | Discharge: 2021-01-12 | Disposition: A | Discharge status: Home / Self Care | Payer: Medicare Other | Source: Ambulatory Visit | Attending: Oncology | Admitting: Oncology

## 2021-01-12 ENCOUNTER — Encounter (HOSPITAL_COMMUNITY): Payer: Self-pay

## 2021-01-12 DIAGNOSIS — M810 Age-related osteoporosis without current pathological fracture: Secondary | ICD-10-CM | POA: Insufficient documentation

## 2021-01-12 DIAGNOSIS — R922 Inconclusive mammogram: Secondary | ICD-10-CM | POA: Diagnosis not present

## 2021-01-12 DIAGNOSIS — C50412 Malignant neoplasm of upper-outer quadrant of left female breast: Secondary | ICD-10-CM | POA: Insufficient documentation

## 2021-01-12 DIAGNOSIS — M818 Other osteoporosis without current pathological fracture: Secondary | ICD-10-CM | POA: Diagnosis not present

## 2021-01-12 DIAGNOSIS — J449 Chronic obstructive pulmonary disease, unspecified: Secondary | ICD-10-CM

## 2021-01-12 DIAGNOSIS — Z170 Estrogen receptor positive status [ER+]: Secondary | ICD-10-CM | POA: Insufficient documentation

## 2021-01-12 LAB — CBC WITH DIFFERENTIAL/PLATELET
Abs Immature Granulocytes: 0.02 10*3/uL (ref 0.00–0.07)
Basophils Absolute: 0.1 10*3/uL (ref 0.0–0.1)
Basophils Relative: 1 %
Eosinophils Absolute: 0.2 10*3/uL (ref 0.0–0.5)
Eosinophils Relative: 4 %
HCT: 43.1 % (ref 36.0–46.0)
Hemoglobin: 13.1 g/dL (ref 12.0–15.0)
Immature Granulocytes: 0 %
Lymphocytes Relative: 31 %
Lymphs Abs: 1.5 10*3/uL (ref 0.7–4.0)
MCH: 31.2 pg (ref 26.0–34.0)
MCHC: 30.4 g/dL (ref 30.0–36.0)
MCV: 102.6 fL — ABNORMAL HIGH (ref 80.0–100.0)
Monocytes Absolute: 0.5 10*3/uL (ref 0.1–1.0)
Monocytes Relative: 10 %
Neutro Abs: 2.6 10*3/uL (ref 1.7–7.7)
Neutrophils Relative %: 54 %
Platelets: 151 10*3/uL (ref 150–400)
RBC: 4.2 MIL/uL (ref 3.87–5.11)
RDW: 14.8 % (ref 11.5–15.5)
WBC: 4.9 10*3/uL (ref 4.0–10.5)
nRBC: 0 % (ref 0.0–0.2)

## 2021-01-12 LAB — COMPREHENSIVE METABOLIC PANEL
ALT: 12 U/L (ref 0–44)
AST: 14 U/L — ABNORMAL LOW (ref 15–41)
Albumin: 4 g/dL (ref 3.5–5.0)
Alkaline Phosphatase: 80 U/L (ref 38–126)
Anion gap: 6 (ref 5–15)
BUN: 14 mg/dL (ref 8–23)
CO2: 37 mmol/L — ABNORMAL HIGH (ref 22–32)
Calcium: 9.2 mg/dL (ref 8.9–10.3)
Chloride: 97 mmol/L — ABNORMAL LOW (ref 98–111)
Creatinine, Ser: 0.61 mg/dL (ref 0.44–1.00)
GFR, Estimated: >60 mL/min (ref >60)
Glucose, Bld: 121 mg/dL — ABNORMAL HIGH (ref 70–99)
Potassium: 4 mmol/L (ref 3.5–5.1)
Sodium: 140 mmol/L (ref 135–145)
Total Bilirubin: 0.5 mg/dL (ref 0.3–1.2)
Total Protein: 7.3 g/dL (ref 6.5–8.1)

## 2021-01-12 LAB — VITAMIN D 25 HYDROXY (VIT D DEFICIENCY, FRACTURES): Vit D, 25-Hydroxy: 59.77 ng/mL (ref 30–100)

## 2021-01-12 LAB — LACTATE DEHYDROGENASE: LDH: 126 U/L (ref 98–192)

## 2021-01-12 LAB — VITAMIN B12: Vitamin B-12: 939 pg/mL — ABNORMAL HIGH (ref 180–914)

## 2021-01-13 LAB — FOLATE: Folate: 77.5 ng/mL (ref >5.9)

## 2021-01-20 ENCOUNTER — Ambulatory Visit (HOSPITAL_COMMUNITY): Payer: Medicare Other | Admitting: Hematology

## 2021-01-25 ENCOUNTER — Other Ambulatory Visit: Payer: Self-pay | Admitting: Pulmonary Disease

## 2021-01-27 DIAGNOSIS — J449 Chronic obstructive pulmonary disease, unspecified: Secondary | ICD-10-CM | POA: Diagnosis not present

## 2021-01-28 ENCOUNTER — Ambulatory Visit (HOSPITAL_COMMUNITY): Payer: Medicare Other | Admitting: Hematology

## 2021-02-09 DIAGNOSIS — J449 Chronic obstructive pulmonary disease, unspecified: Secondary | ICD-10-CM | POA: Diagnosis not present

## 2021-02-27 DIAGNOSIS — J449 Chronic obstructive pulmonary disease, unspecified: Secondary | ICD-10-CM | POA: Diagnosis not present

## 2021-03-04 DIAGNOSIS — M1990 Unspecified osteoarthritis, unspecified site: Secondary | ICD-10-CM | POA: Diagnosis not present

## 2021-03-04 DIAGNOSIS — R202 Paresthesia of skin: Secondary | ICD-10-CM | POA: Diagnosis not present

## 2021-03-04 DIAGNOSIS — Z72820 Sleep deprivation: Secondary | ICD-10-CM | POA: Diagnosis not present

## 2021-03-04 DIAGNOSIS — G629 Polyneuropathy, unspecified: Secondary | ICD-10-CM | POA: Diagnosis not present

## 2021-03-04 DIAGNOSIS — G5603 Carpal tunnel syndrome, bilateral upper limbs: Secondary | ICD-10-CM | POA: Diagnosis not present

## 2021-03-04 DIAGNOSIS — M79643 Pain in unspecified hand: Secondary | ICD-10-CM | POA: Diagnosis not present

## 2021-03-04 DIAGNOSIS — G5600 Carpal tunnel syndrome, unspecified upper limb: Secondary | ICD-10-CM | POA: Diagnosis not present

## 2021-03-11 DIAGNOSIS — Z853 Personal history of malignant neoplasm of breast: Secondary | ICD-10-CM | POA: Insufficient documentation

## 2021-03-12 DIAGNOSIS — J449 Chronic obstructive pulmonary disease, unspecified: Secondary | ICD-10-CM | POA: Diagnosis not present

## 2021-03-14 NOTE — Progress Notes (Deleted)
RESCHEDULED PER PATIENT

## 2021-03-15 ENCOUNTER — Other Ambulatory Visit (HOSPITAL_COMMUNITY): Payer: Self-pay | Admitting: Physician Assistant

## 2021-03-15 ENCOUNTER — Ambulatory Visit (HOSPITAL_COMMUNITY): Payer: Medicare Other | Admitting: Physician Assistant

## 2021-03-15 DIAGNOSIS — C50412 Malignant neoplasm of upper-outer quadrant of left female breast: Secondary | ICD-10-CM

## 2021-03-30 DIAGNOSIS — J449 Chronic obstructive pulmonary disease, unspecified: Secondary | ICD-10-CM | POA: Diagnosis not present

## 2021-03-31 NOTE — Progress Notes (Signed)
[place], [place] [address]   CLINIC:  Medical Oncology/Hematology  PCP:  Claretta Fraise, MD [address] [place] [address] [phone]   REASON FOR VISIT:  Follow-up for stage Ia left breast cancer (diagnosed 11/2017)  PRIOR THERAPY: - Lumpectomy - Anastrozole (03/2018 through 04/2018) - Letrozole (04/2018 through 12/2018) - Tamoxifen (12/2018 through 02/2018)  CURRENT THERAPY: None  BRIEF ONCOLOGIC HISTORY:  Oncology History   No history exists.    CANCER STAGING: Cancer Staging  No matching staging information was found for the patient.  INTERVAL HISTORY:  Ms. Joanne Kramer, a 76 y.o. female, returns for routine follow-up of her history of left-sided breast cancer. [name] was last evaluated on 03/17/2020 via telemedicine visit by NP Faythe Casa.  At today's visit, she reports feeling well.  She denies any recent hospitalizations, surgeries, or changes in her baseline health status.  Most recent mammogram on 01/12/2021 showed stable lumpectomy changes in the left breast with no mammographic evidence of malignancy in either breast.  She denies any symptoms of recurrence such as new lumps, bone pain, chest pain, or abdominal pain. She has chronic morning headaches, but denies any new or not resolving headaches, seizures, or focal neurologic deficits. She has chronic dyspnea on exertion related to her COPD, which she reports tends to be worse in the winter.  She continues to have some residual muscle aches and joint pain, which she had previously attributed to antiestrogen therapy and Prolia.  However, these pains have improved somewhat after she stopped her antiestrogen therapy and Prolia.  She does not want to go back on AI, tamoxifen, or Prolia despite a robust discussion of the risks and benefits.  She reports 40% energy and 100% appetite.  She is maintaining stable weight at this time.   REVIEW OF SYSTEMS:  Review of  Systems  Constitutional:  Positive for fatigue. Negative for appetite change, chills, diaphoresis, fever and unexpected weight change.  HENT:   Negative for lump/mass and nosebleeds.   Eyes:  Negative for eye problems.  Respiratory:  Positive for cough, shortness of breath (COPD) and wheezing. Negative for hemoptysis.   Cardiovascular:  Positive for chest pain (occurs when she is in A. fib, none today) and palpitations (Has been in A. fib twice last week). Negative for leg swelling.  Gastrointestinal:  Negative for abdominal pain, blood in stool, constipation, diarrhea, nausea and vomiting.  Genitourinary:  Negative for hematuria.   Skin: Negative.   Neurological:  Positive for headaches and numbness. Negative for dizziness and light-headedness.  Hematological:  Does not bruise/bleed easily.  Psychiatric/Behavioral:  Positive for depression and sleep disturbance.    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   OA (osteoarthritis) of knee    right   Pneumonia    Requires supplemental oxygen    Past Surgical History:  Procedure Laterality Date   BALLOON DILATION N/A 03/03/2020   Procedure: BALLOON DILATION;  Surgeon: Eloise Harman, DO;  [facility];  Service: Endoscopy;  Laterality: N/A;   BIOPSY  03/03/2020   Procedure: BIOPSY;  Surgeon: Eloise Harman, DO;  [facility];  Service: Endoscopy;;   BREAST LUMPECTOMY WITH RADIOACTIVE SEED AND SENTINEL LYMPH NODE BIOPSY Left 03/05/2018   Procedure: LEFT BREAST LUMPECTOMY WITH RADIOACTIVE SEED AND LEFT AXILLARY SENTINEL LYMPH NODE BIOPSY;  Surgeon: Rolm Bookbinder, MD;  Location: [facility];  Service: General;  Laterality: Left;   BREAST  SURGERY     left breast aspiration   CESAREAN SECTION     COLONOSCOPY     ESOPHAGEAL BRUSHING  03/03/2020   Procedure: ESOPHAGEAL BRUSHING;  Surgeon: Eloise Harman, DO;  [facility];  Service: Endoscopy;;   ESOPHAGOGASTRODUODENOSCOPY (EGD) WITH PROPOFOL N/A 03/03/2020   [place]: LA grade a reflux esophagitis, multiple Schatzki ring status post dilation, gastritis (nonspecific reactive gastropathy, negative for H. pylori), small bowel biopsy with duodenal mucosa with mild nonspecific reactive changes, negative for increased intraepithelial lymphocytes or villous architectural changes, cells for cytology from the esophagus negative for yeast.   EYE SURGERY Bilateral    KNEE SURGERY Right    TONSILLECTOMY     TOTAL KNEE ARTHROPLASTY Right 06/27/2017   Procedure: RIGHT TOTAL KNEE ARTHROPLASTY;  Surgeon: Garald Balding, MD;  Location: [facility];  Service: Orthopedics;  Laterality: Right;    SOCIAL HISTORY:  Social History   Socioeconomic History   Marital status: Soil scientist    Spouse name: Not on file   Number of children: 2   Years of education: 16   Highest education level: Bachelor's degree (e.g., BA, AB, BS)  Occupational History   Occupation: Retired    Comment: Careers information officer  Tobacco Use   Smoking status: Every Day    Packs/day: 0.50    Years: 55.00    Pack years: 27.50    Types: Cigarettes   Smokeless tobacco: Never   Tobacco comments:    smokes 10 cigarettes per day 05/25/2020  Vaping Use   Vaping Use: Never used  Substance and Sexual Activity   Alcohol use: No   Drug use: No   Sexual activity: Not Currently  Other Topics Concern   Not on file  Social History Narrative   Not on file   Social Determinants of Health   Financial Resource Strain: Not on file  Food Insecurity: Not on file  Transportation Needs: Not on file  Physical Activity: Not on file  Stress: Not on file  Social Connections: Not on file  Intimate Partner Violence: Not on file     FAMILY HISTORY:  Family History  Adopted: Yes    CURRENT MEDICATIONS:  Current Outpatient Medications  Medication Sig Dispense Refill   BREZTRI AEROSPHERE 160-9-4.8 MCG/ACT AERO TAKE 2 PUFFS BY MOUTH TWICE A DAY 10.7 g 3   Calcium Carb-Cholecalciferol (CALCIUM 600+D3 PO) Take 1 tablet by mouth daily.     calcium carbonate (TUMS EX) 750 MG chewable tablet Chew 750-1,500 mg by mouth 3 (three) times daily as needed (for heartburn/indigestion.).     clotrimazole (MYCELEX) 10 MG troche Take 10 mg by mouth 5 (five) times daily as needed (thrush).     COMBIVENT RESPIMAT 20-100 MCG/ACT AERS respimat INHALE 1 PUFF INTO THE LUNGS EVERY 4 (FOUR) HOURS AS NEEDED (USE 4 TIMES DAILY) 4 g 6   Dextromethorphan-guaiFENesin (MUCINEX DM MAXIMUM STRENGTH) 60-1200 MG TB12 Take 1 tablet by mouth 2 (two) times daily as needed (pain).     Melatonin 5 MG TABS Take 5 mg by mouth at bedtime as needed (for sleep.).      Multiple Vitamin (MULTIVITAMIN WITH MINERALS) TABS tablet Take 1 tablet by mouth daily.  naproxen sodium (ALEVE) 220 MG tablet Take 440 mg by mouth daily as needed (pain).     OXYGEN Inhale 2 L into the lungs at bedtime. Bedtime and as needed     triamcinolone cream (KENALOG) 0.1 % Apply 1 application topically 2 (two) times daily as needed (for skin irritation/contact dermatitis). 453.6 g 1   vitamin B-12 (CYANOCOBALAMIN) 1000 MCG tablet Take 1,000 mcg by mouth daily.     No current facility-administered medications for this visit.    ALLERGIES:  Allergies  Allergen Reactions   Prolia [Denosumab] Other (See Comments)    Joint pain   Anastrozole     Joint pain   Letrozole     Joint pain   Tamoxifen     Joint pain    PHYSICAL EXAM:  Performance status (ECOG): 1 - Symptomatic but completely ambulatory  There were no vitals filed for this visit. Wt Readings from Last 3 Encounters:  06/01/20 177 lb 12.8 oz (80.6 kg)  05/25/20 177 lb 6.4 oz (80.5 kg)  03/03/20 172 lb 2.9 oz (78.1  kg)   Physical Exam Constitutional:      Appearance: Normal appearance.  HENT:     Head: Normocephalic and atraumatic.     Mouth/Throat:     Mouth: Mucous membranes are moist.  Eyes:     Extraocular Movements: Extraocular movements intact.     Pupils: Pupils are equal, round, and reactive to light.  Cardiovascular:     Rate and Rhythm: Normal rate and regular rhythm.     Pulses: Normal pulses.     Heart sounds: Normal heart sounds.  Pulmonary:     Effort: Pulmonary effort is normal.     Breath sounds: Decreased air movement present. Wheezing present.  Chest:  Breasts:    Right: Normal. No swelling, inverted nipple, mass, nipple discharge or tenderness.     Left: Normal. No swelling, inverted nipple, mass, nipple discharge or tenderness.     Comments: No discrete nodules or masses palpated in either breast. Abdominal:     General: Bowel sounds are normal.     Palpations: Abdomen is soft.     Tenderness: There is no abdominal tenderness.  Musculoskeletal:        General: No swelling.     Right lower leg: No edema.     Left lower leg: No edema.  Lymphadenopathy:     Cervical: No cervical adenopathy.  Skin:    General: Skin is warm and dry.  Neurological:     General: No focal deficit present.     Mental Status: She is alert and oriented to person, place, and time.  Psychiatric:        Mood and Affect: Mood normal.        Behavior: Behavior normal.     LABORATORY DATA:  I have reviewed the labs as listed.  CBC Latest Ref Rng & Units 01/12/2021 03/10/2020 01/06/2020  WBC 4.0 - 10.5 K/uL 4.9 5.6 5.2  Hemoglobin 12.0 - 15.0 g/dL 13.1 14.1 13.4  Hematocrit 36.0 - 46.0 % 43.1 46.3(H) 41.9  Platelets 150 - 400 K/uL 151 161 199   CMP Latest Ref Rng & Units 01/12/2021 10/13/2020 03/10/2020  Glucose 70 - 99 mg/dL 121(H) - 159(H)  BUN 8 - 23 mg/dL 14 - 12  Creatinine 0.44 - 1.00 mg/dL 0.61 0.60 0.75  Sodium 135 - 145 mmol/L 140 - 139  Potassium 3.5 - 5.1 mmol/L 4.0 - 3.9   Chloride 98 - 111 mmol/L  97(L) - 99  CO2 22 - 32 mmol/L 37(H) - 32  Calcium 8.9 - 10.3 mg/dL 9.2 - 9.0  Total Protein 6.5 - 8.1 g/dL 7.3 - 7.2  Total Bilirubin 0.3 - 1.2 mg/dL 0.5 - 0.5  Alkaline Phos 38 - 126 U/L 80 - 86  AST 15 - 41 U/L 14(L) - 14(L)  ALT 0 - 44 U/L 12 - 13    DIAGNOSTIC IMAGING:  I have independently reviewed the scans and discussed with the patient. No results found.   ASSESSMENT & PLAN: 1.  Stage Ia left breast cancer  - Mammogram on 12/26/2017 showed suspicious 9 mm mass in the upper outer quadrant of the left breast.  Solitary borderline left axillary lymph node with cortical thickness of 4 mm. - Biopsy on 01/02/2018 of the left breast 2 o'clock position consistent with invasive ductal carcinoma, grade 1-2.  Left axillary lymph node was negative for carcinoma. - Left breast lumpectomy and sentinel lymph node biopsy on 03/05/2018 showed 1.2 cm IDC, margins negative, grade 1, 0/2 lymph nodes involved.  ER/PR positive, HER-2 negative, Ki-67 2%. - She was seen by [facility] radiation oncology in [place] but elected not to pursue adjuvant radiation treatment. - Anastrozole was started in February 2020.  However she had developed joint pains.  It was switched to letrozole on 05/22/2018. -She continued to have significant joint pain.  She was then switched to tamoxifen in November 2020, but still reported significant joint pain that is affecting her everyday living. She stopped taking tamoxifen in January 2020.  Risk versus benefits were discussed with the patient. - Most recent mammogram (01/12/2021): Stable lumpectomy changes in the left breast, no mammographic evidence of malignancy in either breast (BI-RADS Category 2, benign) - Labs today (04/01/2021): Unremarkable CBC.  Normal kidney function and LFTs.  Normal LDH. - Physical exam did not reveal any discrete nodules or masses in either breast. - PLAN: Continue to recommend tamoxifen versus AI, but patient continues to decline due to  previous side effects. - We discussed every 46-monthvisits since she is not on any antiestrogen therapy, but she prefers to come back in 1 year.  (She requested to be discharged from the clinic to her PCP, but I have asked that she follow[place] at [place], to which she agrees). - She will be due for repeat mammogram in November 2023.  Repeat labs and office visit after mammogram.  2.  Osteoporosis - DEXA scan on 08/01/2018 showed a T score of -2.9 osteoporosis. - Prolia started on 08/09/2018. - Prolia discontinued secondary to muscle aches and joint pain. - Most recent DEXA (10/20/2020): T score -3.0, osteoporotic - Most recent vitamin D (01/12/2021): Normal at 59.77 - PLAN: Patient continues to refuse Prolia due to poor tolerance of side effects (myalgia and arthralgia). - Continue calcium and vitamin D. - Continue weightbearing exercises. - Encouraged patient to discuss with PCP regarding other potential treatments of osteoporosis.    3.  Tobacco use -Patient started smoking at age 76  At her peak she smoked 2 packs/day for 5 years.  She now smokes a half a pack per day.  - LDCT scan (04/03/2020): Lung RADS 2, benign  - PLAN: She is due for her next LDCT scan in February 2023.  We will schedule this today.   PLAN SUMMARY & DISPOSITION: Schedule for LDCT this month Labs and mammogram in November 2023 OV after mammo and labs  All questions were answered. The patient  knows to call the clinic with any problems, questions or concerns.  Medical decision making: Moderate  Time spent on visit: I spent 20 minutes counseling the patient face to face. The total time spent in the appointment was 30 minutes and more than 50% was on counseling.   Harriett Rush, PA-C  04/01/2021 4:56 PM

## 2021-04-01 ENCOUNTER — Inpatient Hospital Stay (HOSPITAL_COMMUNITY): Payer: Medicare Other | Attending: Physician Assistant | Admitting: Physician Assistant

## 2021-04-01 ENCOUNTER — Other Ambulatory Visit (HOSPITAL_COMMUNITY): Payer: Self-pay | Admitting: Physician Assistant

## 2021-04-01 ENCOUNTER — Other Ambulatory Visit: Payer: Self-pay

## 2021-04-01 ENCOUNTER — Inpatient Hospital Stay (HOSPITAL_COMMUNITY): Payer: Medicare Other

## 2021-04-01 VITALS — BP 132/76 | HR 83 | Temp 98.4°F | Resp 20 | Ht 64.0 in | Wt 166.3 lb

## 2021-04-01 DIAGNOSIS — F17210 Nicotine dependence, cigarettes, uncomplicated: Secondary | ICD-10-CM | POA: Insufficient documentation

## 2021-04-01 DIAGNOSIS — C50412 Malignant neoplasm of upper-outer quadrant of left female breast: Secondary | ICD-10-CM

## 2021-04-01 DIAGNOSIS — M810 Age-related osteoporosis without current pathological fracture: Secondary | ICD-10-CM

## 2021-04-01 DIAGNOSIS — Z170 Estrogen receptor positive status [ER+]: Secondary | ICD-10-CM

## 2021-04-01 DIAGNOSIS — Z1231 Encounter for screening mammogram for malignant neoplasm of breast: Secondary | ICD-10-CM

## 2021-04-01 DIAGNOSIS — Z87891 Personal history of nicotine dependence: Secondary | ICD-10-CM

## 2021-04-01 LAB — CBC WITH DIFFERENTIAL/PLATELET
Abs Immature Granulocytes: 0.01 10*3/uL (ref 0.00–0.07)
Basophils Absolute: 0 10*3/uL (ref 0.0–0.1)
Basophils Relative: 1 %
Eosinophils Absolute: 0.2 10*3/uL (ref 0.0–0.5)
Eosinophils Relative: 4 %
HCT: 43 % (ref 36.0–46.0)
Hemoglobin: 13.1 g/dL (ref 12.0–15.0)
Immature Granulocytes: 0 %
Lymphocytes Relative: 32 %
Lymphs Abs: 1.6 10*3/uL (ref 0.7–4.0)
MCH: 31.3 pg (ref 26.0–34.0)
MCHC: 30.5 g/dL (ref 30.0–36.0)
MCV: 102.9 fL — ABNORMAL HIGH (ref 80.0–100.0)
Monocytes Absolute: 0.4 10*3/uL (ref 0.1–1.0)
Monocytes Relative: 9 %
Neutro Abs: 2.6 10*3/uL (ref 1.7–7.7)
Neutrophils Relative %: 54 %
Platelets: 160 10*3/uL (ref 150–400)
RBC: 4.18 MIL/uL (ref 3.87–5.11)
RDW: 14 % (ref 11.5–15.5)
WBC: 4.8 10*3/uL (ref 4.0–10.5)
nRBC: 0 % (ref 0.0–0.2)

## 2021-04-01 LAB — COMPREHENSIVE METABOLIC PANEL
ALT: 13 U/L (ref 0–44)
AST: 16 U/L (ref 15–41)
Albumin: 4.1 g/dL (ref 3.5–5.0)
Alkaline Phosphatase: 84 U/L (ref 38–126)
Anion gap: 5 (ref 5–15)
BUN: 13 mg/dL (ref 8–23)
CO2: 37 mmol/L — ABNORMAL HIGH (ref 22–32)
Calcium: 9.2 mg/dL (ref 8.9–10.3)
Chloride: 96 mmol/L — ABNORMAL LOW (ref 98–111)
Creatinine, Ser: 0.6 mg/dL (ref 0.44–1.00)
GFR, Estimated: >60 mL/min (ref >60)
Glucose, Bld: 94 mg/dL (ref 70–99)
Potassium: 4.3 mmol/L (ref 3.5–5.1)
Sodium: 138 mmol/L (ref 135–145)
Total Bilirubin: 0.3 mg/dL (ref 0.3–1.2)
Total Protein: 7.4 g/dL (ref 6.5–8.1)

## 2021-04-01 LAB — LACTATE DEHYDROGENASE: LDH: 117 U/L (ref 98–192)

## 2021-04-01 NOTE — Patient Instructions (Signed)
[place] at [facility] Discharge Instructions  You were seen today by Tarri Abernethy PA-C for your history of breast cancer.  Your most recent mammogram, labs, and physical exam did not show any signs of returning breast cancer.    LABS: Return in November 2023 for repeat labs   OTHER TESTS: - Mammogram in November 2023 - Low dose CT chest for lung cancer screening  MEDICATIONS: ** Please ask your primary care doctor about other treatment options for osteoporosis.  FOLLOW-UP APPOINTMENT: Office visit in November/December 2023   Thank you for choosing [place] at [facility] to provide your oncology and hematology care.  To afford each patient quality time with our provider, please arrive at least 15 minutes before your scheduled appointment time.   If you have a lab appointment with the [facility] please come in thru the Main Entrance and check in at the main information desk.  You need to re-schedule your appointment should you arrive 10 or more minutes late.  We strive to give you quality time with our providers, and arriving late affects you and other patients whose appointments are after yours.  Also, if you no show three or more times for appointments you may be dismissed from the clinic at the providers discretion.     Again, thank you for choosing [facility].  Our hope is that these requests will decrease the amount of time that you wait before being seen by our physicians.       _____________________________________________________________  Should you have questions after your visit to [facility], please contact our office at [phone] and follow the prompts.  Our office hours are 8:00 a.m. and 4:30 p.m. Monday - Friday.  Please note that voicemails left after 4:00 p.m. may not be returned until the following business day.  We are closed weekends and major holidays.  You do have access to a  nurse 24-7, just call the main number to the clinic [phone] and do not press any options, hold on the line and a nurse will answer the phone.    For prescription refill requests, have your pharmacy contact our office and allow 72 hours.    Due to Covid, you will need to wear a mask upon entering the hospital. If you do not have a mask, a mask will be given to you at the Main Entrance upon arrival. For doctor visits, patients may have 1 support person age 66 or older with them. For treatment visits, patients can not have anyone with them due to social distancing guidelines and our immunocompromised population.

## 2021-04-12 DIAGNOSIS — J449 Chronic obstructive pulmonary disease, unspecified: Secondary | ICD-10-CM | POA: Diagnosis not present

## 2021-04-27 DIAGNOSIS — J449 Chronic obstructive pulmonary disease, unspecified: Secondary | ICD-10-CM | POA: Diagnosis not present

## 2021-05-10 DIAGNOSIS — J449 Chronic obstructive pulmonary disease, unspecified: Secondary | ICD-10-CM | POA: Diagnosis not present

## 2021-05-12 ENCOUNTER — Other Ambulatory Visit: Payer: Self-pay

## 2021-05-12 ENCOUNTER — Ambulatory Visit (HOSPITAL_COMMUNITY)
Admission: RE | Admit: 2021-05-12 | Discharge: 2021-05-12 | Disposition: A | Discharge status: Home / Self Care | Payer: Medicare Other | Source: Ambulatory Visit | Attending: Physician Assistant | Admitting: Physician Assistant

## 2021-05-12 DIAGNOSIS — Z87891 Personal history of nicotine dependence: Secondary | ICD-10-CM | POA: Diagnosis not present

## 2021-05-12 DIAGNOSIS — F17210 Nicotine dependence, cigarettes, uncomplicated: Secondary | ICD-10-CM | POA: Diagnosis not present

## 2021-05-25 ENCOUNTER — Other Ambulatory Visit: Payer: Self-pay | Admitting: Pulmonary Disease

## 2021-05-28 DIAGNOSIS — J449 Chronic obstructive pulmonary disease, unspecified: Secondary | ICD-10-CM | POA: Diagnosis not present

## 2021-06-10 DIAGNOSIS — J449 Chronic obstructive pulmonary disease, unspecified: Secondary | ICD-10-CM | POA: Diagnosis not present

## 2021-06-22 ENCOUNTER — Other Ambulatory Visit: Payer: Self-pay | Admitting: Pulmonary Disease

## 2021-06-23 ENCOUNTER — Ambulatory Visit: Payer: Medicare Other | Admitting: Pulmonary Disease

## 2021-06-23 ENCOUNTER — Encounter: Payer: Self-pay | Admitting: Pulmonary Disease

## 2021-06-23 VITALS — BP 130/78 | HR 84 | Temp 98.7°F | Ht 64.0 in | Wt 169.6 lb

## 2021-06-23 DIAGNOSIS — J449 Chronic obstructive pulmonary disease, unspecified: Secondary | ICD-10-CM

## 2021-06-23 MED ORDER — AZITHROMYCIN 250 MG PO TABS: ORAL_TABLET | ORAL | 0 refills | Status: AC

## 2021-06-23 MED ORDER — ALBUTEROL SULFATE HFA 108 (90 BASE) MCG/ACT IN AERS: 2.0000 | INHALATION_SPRAY | RESPIRATORY_TRACT | 5 refills | Status: DC | PRN

## 2021-06-23 MED ORDER — PREDNISONE 10 MG PO TABS: ORAL_TABLET | ORAL | 0 refills | Status: AC

## 2021-06-23 MED ORDER — DEXTROMETHORPHAN-GUAIFENESIN 5-50 MG/ML PO LIQD: 5.0000 mL | Freq: Four times a day (QID) | ORAL | Status: DC | PRN

## 2021-06-23 MED ORDER — ALBUTEROL SULFATE (2.5 MG/3ML) 0.083% IN NEBU: 2.5000 mg | INHALATION_SOLUTION | Freq: Four times a day (QID) | RESPIRATORY_TRACT | 5 refills | Status: DC | PRN

## 2021-06-23 NOTE — Patient Instructions (Signed)
Prednisone 10 mg pill >> 3 pills daily for 2 days, 2 pills daily for 2 days, 1 pill daily for 2 days ? ?Zithromax 250 mg pill >> 2 pills on day 1, then 1 pill daily for next 4 days ? ?Will arrange for home nebulizer machine ? ?You can use albuterol puffer or nebulizer every 4 to 6 hours as needed to help with cough, wheeze, chest congestion or shortness of breath ? ?Use your oxygen to keep your level above 90%, and put your oxygen on before you start doing activities ? ?Follow up in 3 months ?

## 2021-06-23 NOTE — Progress Notes (Signed)
? ?[facility] ? ?Chief Complaint  ?Patient presents with  ? Follow-up  ?  Is 3LO2 at night and more freq. during the day since last ov.  ?Feels her breathing has worsened and is now having a hard time with daily activities.  ?  ? ? ?Constitutional:  ?BP 130/78 (BP Location: Left Arm, Patient Position: Sitting)   Pulse 84   Temp 98.7 ?F (37.1 ?C) (Temporal)   Ht '5\' 4"'$  (1.626 m)   Wt 169 lb 9.6 oz (76.9 kg)   SpO2 (!) 88% Comment: ra- patient refused O2  BMI 29.11 kg/m?  ? ?Past Medical History:  ?Lt breast cancer s/p lumpectomy January 2020, Osteoporosis, HLD, Pneumonia ? ?Past Surgical History:  ?She  has a past surgical history that includes Cesarean section; Knee surgery (Right); Tonsillectomy; Breast surgery; Colonoscopy; Total knee arthroplasty (Right, 06/27/2017); Breast lumpectomy with radioactive seed and sentinel lymph node biopsy (Left, 03/05/2018); Eye surgery (Bilateral); Esophagogastroduodenoscopy (egd) with propofol (N/A, 03/03/2020); Balloon dilation (N/A, 03/03/2020); biopsy (03/03/2020); and Esophageal brushing (03/03/2020). ? ?Brief Summary:  ?Joanne Kramer is a 76 y.o. female smoker with COPD and chronic respiratory failure. ?  ? ? ? ?Subjective:  ? ?She is smoking about 10 cigarettes per day.  She is hoping to quit on May 1. ? ?She has cough and chest congestion.  Getting more short of breath.  Needing to use combivent more.  Has some ankle swelling.  Sleeping okay.  No fever, chest pain, or hemoptysis.  Using oxygen at night, and feels like she needs to use oxygen on a more regular basis during the day.  She feels like she has phlegm on the right side of her neck.  She was seen by ENT in [place] and [place].  She was noted to have dysphagia with Schatzki's ring and Lt paraglottic lesion on CT neck from September 2022. ? ?Her CT chest from March showed stable ascending aorta, atherosclerosis, cardiomegaly, coronary calcification, moderate emphysema,  stable nodules up to 7 mm. ? ?CMET from 04/01/21 showed CO2 of 37. ? ?Physical Exam:  ? ?Appearance - well kempt  ? ?ENMT - no sinus tenderness, no oral exudate, no LAN, Mallampati 3 airway, no stridor ? ?Respiratory - decreased breath sounds, bilateral rhonchi more at bases that partially clear with coughing ? ?CV - s1s2 regular rate and rhythm, no murmurs ? ?Ext - no clubbing, no edema ? ?Skin - no rashes ? ?Psych - normal mood and affect ? ?  ?Pulmonary testing:  ?PFT 08/20/19 >> FEV1 0.67 (29%), FEV1% 42, TLC 5.79 (111%), RV 4.08 (179%), DLCO 45% ? ?Chest Imaging:  ?LDCT 08/29/19 >> coronary calcification, 4 cm ascending aorta, centrilobular emphysema, innumerable tiny upper lung GGO, calcified granuloma RUL, 6.6 mm nodule RLL ?LDCT 04/06/20 >> small foci of tree in bud in Lt apex, otherwise no change ?LDCT 05/13/21 >> no change ? ?Social History:  ?She  reports that she has been smoking cigarettes. She has a 27.50 pack-year smoking history. She has never used smokeless tobacco. She reports that she does not drink alcohol and does not use drugs. ? ?Family History:  ?Her family history is not on file. She was adopted. ?  ? ? ?Discussion:  ?She has progression of dyspnea with exertion.  Suspect this is related to her COPD.  However, if her symptoms persist, then she might need further assessment by cardiology. ? ?Assessment/Plan:  ? ?COPD with chronic bronchitis and emphysema. ?- will give course of prednisone  and zithromax ?- will arrange for home nebulizer with prn albuterol ?- continue breztri ?- change from combivent to albuterol prn ?  ?Chronic respiratory failure with hypoxia. ?- uses [employer] for DME ?- goal SpO2 90% ?- discussed importance of using supplemental oxygen proactively ?- might need to assess for NIV  ?  ?Tobacco abuse. ?- intolerant of chantix due to nausea ?- she has set quit date for May 1 ? ?Coronary calcifications on CT imaging, aortic dilation. ?- might need further assessment by cardiology if  dyspnea persists ? ?Lt paraglottic lesion on CT neck from August 2022. ?- she was seen by ENT at [facility] ? ?Time Spent Involved in Patient Care on Day of Examination:  ?38 minutes ? ?Follow up:  ? ?Patient Instructions  ?Prednisone 10 mg pill >> 3 pills daily for 2 days, 2 pills daily for 2 days, 1 pill daily for 2 days ? ?Zithromax 250 mg pill >> 2 pills on day 1, then 1 pill daily for next 4 days ? ?Will arrange for home nebulizer machine ? ?You can use albuterol puffer or nebulizer every 4 to 6 hours as needed to help with cough, wheeze, chest congestion or shortness of breath ? ?Use your oxygen to keep your level above 90%, and put your oxygen on before you start doing activities ? ?Follow up in 3 months ? ?Medication List:  ? ?Allergies as of 06/23/2021   ? ?   Reactions  ? Prolia [denosumab] Other (See Comments)  ? Joint pain  ? Anastrozole   ? Joint pain  ? Letrozole   ? Joint pain  ? Tamoxifen   ? Joint pain  ? ?  ? ?  ?Medication List  ?  ? ?  ? Accurate as of June 23, 2021  2:29 PM. If you have any questions, ask your nurse or doctor.  ?  ?  ? ?  ? ?STOP taking these medications   ? ?Combivent Respimat 20-100 MCG/ACT Aers respimat ?Generic drug: Ipratropium-Albuterol ?Stopped by: Chesley Mires, MD ?  ?denosumab 60 MG/ML Sosy injection ?Commonly known as: PROLIA ?Stopped by: Chesley Mires, MD ?  ?famotidine 20 MG tablet ?Commonly known as: PEPCID ?Stopped by: Chesley Mires, MD ?  ?fluticasone-salmeterol 250-50 MCG/ACT Aepb ?Commonly known as: ADVAIR ?Stopped by: Chesley Mires, MD ?  ?Mucinex DM Maximum Strength 60-1200 MG Tb12 ?Replaced by: Dextromethorphan-guaiFENesin 5-50 MG/ML Liqd ?Stopped by: Chesley Mires, MD ?  ?simvastatin 20 MG tablet ?Commonly known as: ZOCOR ?Stopped by: Chesley Mires, MD ?  ? ?  ? ?TAKE these medications   ? ?albuterol 108 (90 Base) MCG/ACT inhaler ?Commonly known as: VENTOLIN HFA ?Inhale 2 puffs into the lungs every 4 (four) hours as needed for wheezing or shortness of breath. ?Started by:  Chesley Mires, MD ?  ?albuterol (2.5 MG/3ML) 0.083% nebulizer solution ?Commonly known as: PROVENTIL ?Take 3 mLs (2.5 mg total) by nebulization every 6 (six) hours as needed for wheezing or shortness of breath. ?Started by: Chesley Mires, MD ?  ?azithromycin 250 MG tablet ?Commonly known as: ZITHROMAX ?Take 2 tablets (500 mg total) by mouth daily in the afternoon for 1 day, THEN 1 tablet (250 mg total) daily in the afternoon for 4 days. ?Start taking on: June 23, 2021 ?Started by: Chesley Mires, MD ?  ?Bone Essentials Caps ?Take 1 tablet by mouth daily. ?  ?Breztri Aerosphere 160-9-4.8 MCG/ACT Aero ?Generic drug: Budeson-Glycopyrrol-Formoterol ?TAKE 2 PUFFS BY MOUTH TWICE A DAY ?  ?CALCIUM 600+D3 PO ?Take 1 tablet  by mouth daily. ?  ?calcium carbonate 750 MG chewable tablet ?Commonly known as: TUMS EX ?Chew 750-1,500 mg by mouth 3 (three) times daily as needed (for heartburn/indigestion.). ?  ?clotrimazole 10 MG troche ?Commonly known as: MYCELEX ?Take 10 mg by mouth 5 (five) times daily as needed (thrush). ?  ?Dextromethorphan-guaiFENesin 5-50 MG/ML Liqd ?Take 5 mLs by mouth every 6 (six) hours as needed (cough). ?Replaces: Mucinex DM Maximum Strength 60-1200 MG Tb12 ?Started by: Chesley Mires, MD ?  ?melatonin 5 MG Tabs ?Take 5 mg by mouth at bedtime as needed (for sleep.). ?  ?multivitamin with minerals Tabs tablet ?Take 1 tablet by mouth daily. ?  ?naproxen sodium 220 MG tablet ?Commonly known as: ALEVE ?Take 440 mg by mouth daily as needed (pain). ?  ?OXYGEN ?Inhale 2 L into the lungs at bedtime. Bedtime and as needed ?  ?predniSONE 10 MG tablet ?Commonly known as: DELTASONE ?Take 3 tablets (30 mg total) by mouth daily with breakfast for 2 days, THEN 2 tablets (20 mg total) daily with breakfast for 2 days, THEN 1 tablet (10 mg total) daily with breakfast for 2 days. ?Start taking on: June 23, 2021 ?Started by: Chesley Mires, MD ?  ?triamcinolone cream 0.1 % ?Commonly known as: KENALOG ?Apply 1 application topically 2  (two) times daily as needed (for skin irritation/contact dermatitis). ?  ?vitamin B-12 1000 MCG tablet ?Commonly known as: CYANOCOBALAMIN ?Take 1,000 mcg by mouth daily. ?  ? ?  ? ? ?Signature:  ?Chesley Mires, MD ?[place] Comment

## 2021-06-27 DIAGNOSIS — J449 Chronic obstructive pulmonary disease, unspecified: Secondary | ICD-10-CM | POA: Diagnosis not present

## 2021-06-28 DIAGNOSIS — J449 Chronic obstructive pulmonary disease, unspecified: Secondary | ICD-10-CM | POA: Diagnosis not present

## 2021-07-09 ENCOUNTER — Telehealth: Payer: Self-pay | Admitting: Pulmonary Disease

## 2021-07-09 MED ORDER — IPRATROPIUM-ALBUTEROL 20-100 MCG/ACT IN AERS: 1.0000 | INHALATION_SPRAY | Freq: Four times a day (QID) | RESPIRATORY_TRACT | 11 refills | Status: DC

## 2021-07-09 NOTE — Telephone Encounter (Signed)
States albuterol  is not working for her as well as Scientist, product/process development. States she is SOB and states her SOB has worsened since using albuterol instead of combivent. Patient would like to go back on combivent instead of albuterol rescue inhaler. Has been using O2 as recommended as well and SOB is not improving.   ? ?  ?[facility] [place]  ?Dr. Halford Chessman please advise.  ?

## 2021-07-09 NOTE — Telephone Encounter (Signed)
Okay to send script to change back to combivent. ?

## 2021-07-09 NOTE — Telephone Encounter (Signed)
ATC patient. Left detailed message (ok per dpr) letting patient know Dr. Halford Chessman approved switch back to combivent inhaler and that it was sent in to [place]. Advised patient to stop albuterol inhaler once she starts combivent back. Advised her to call back for further questions. Nothing further needed at this time.  ?

## 2021-07-10 DIAGNOSIS — J449 Chronic obstructive pulmonary disease, unspecified: Secondary | ICD-10-CM | POA: Diagnosis not present

## 2021-07-20 ENCOUNTER — Ambulatory Visit: Payer: Medicare Other | Admitting: Family Medicine

## 2021-07-28 DIAGNOSIS — J449 Chronic obstructive pulmonary disease, unspecified: Secondary | ICD-10-CM | POA: Diagnosis not present

## 2021-07-29 DIAGNOSIS — J449 Chronic obstructive pulmonary disease, unspecified: Secondary | ICD-10-CM | POA: Diagnosis not present

## 2021-08-10 DIAGNOSIS — J449 Chronic obstructive pulmonary disease, unspecified: Secondary | ICD-10-CM | POA: Diagnosis not present

## 2021-08-20 ENCOUNTER — Other Ambulatory Visit: Payer: Self-pay | Admitting: Pulmonary Disease

## 2021-08-27 DIAGNOSIS — J449 Chronic obstructive pulmonary disease, unspecified: Secondary | ICD-10-CM | POA: Diagnosis not present

## 2021-08-28 DIAGNOSIS — J449 Chronic obstructive pulmonary disease, unspecified: Secondary | ICD-10-CM | POA: Diagnosis not present

## 2021-09-09 DIAGNOSIS — J449 Chronic obstructive pulmonary disease, unspecified: Secondary | ICD-10-CM | POA: Diagnosis not present

## 2021-09-27 DIAGNOSIS — J449 Chronic obstructive pulmonary disease, unspecified: Secondary | ICD-10-CM | POA: Diagnosis not present

## 2021-09-28 ENCOUNTER — Ambulatory Visit (INDEPENDENT_AMBULATORY_CARE_PROVIDER_SITE_OTHER): Payer: Medicare Other | Admitting: Family Medicine

## 2021-09-28 ENCOUNTER — Encounter: Payer: Self-pay | Admitting: Family Medicine

## 2021-09-28 VITALS — BP 114/65 | HR 84 | Temp 97.5°F | Ht 64.0 in | Wt 168.6 lb

## 2021-09-28 DIAGNOSIS — Z9981 Dependence on supplemental oxygen: Secondary | ICD-10-CM | POA: Diagnosis not present

## 2021-09-28 DIAGNOSIS — R600 Localized edema: Secondary | ICD-10-CM

## 2021-09-28 DIAGNOSIS — R0902 Hypoxemia: Secondary | ICD-10-CM

## 2021-09-28 DIAGNOSIS — J449 Chronic obstructive pulmonary disease, unspecified: Secondary | ICD-10-CM | POA: Diagnosis not present

## 2021-09-28 DIAGNOSIS — R6889 Other general symptoms and signs: Secondary | ICD-10-CM | POA: Diagnosis not present

## 2021-09-28 NOTE — Progress Notes (Signed)
Assessment & Plan:  1. Bilateral lower extremity edema Education provided on edema.  Continue low-salt diet.  Encouraged to elevate her feet more.  Recommended compression hose to be applied first thing in the morning and removed at bedtime. - Anemia Profile B - CMP14+EGFR - TSH - Brain natriuretic peptide  2-3. Hypoxia/Supplemental oxygen dependent Oxygen applied in the office at 2 L via nasal cannula as patient's oxygen saturation was in the 60s and 70s.  This did bring her up to the 90s.  She does have portable oxygen at home but states it is too heavy.  She is unable to use an on demand portable oxygen concentrator as she is a mouth breather.  She does have a pulmonologist.  We discussed the importance of her wearing her oxygen all the time.  I recommend that she reach out to her DME company and see what other options she has for portable oxygen.   Follow up plan: Return ASAP, for follow-up of chronic medication conditions.  She has not been seen in almost two years.  Hendricks Limes, MSN, APRN, FNP-C [facility]  Subjective:   Patient ID: Joanne Kramer, female    DOB: 08/30/1945, 76 y.o.   MRN: [id number]  HPI: Joanne Kramer is a 76 y.o. female presenting on 09/28/2021 for Joint Swelling (Bilateral ankle swelling x 7-10 days)  Patient reports bilateral ankle swelling that started 7 to 10 days ago.  She feels this came on with the hot humid weather.  States it is better today since it is not hot and humid.  She does follow a low-sodium diet.  She elevates her feet half the time when she is sitting.  She does have some shortness of breath at baseline due to her COPD, but states it is not worse.   ROS: Negative unless specifically indicated above in HPI.   Relevant past medical history reviewed and updated as indicated.   Allergies and medications reviewed and updated.   Current Outpatient Medications:    albuterol (PROVENTIL) (2.5 MG/3ML) 0.083%  nebulizer solution, Take 3 mLs (2.5 mg total) by nebulization every 6 (six) hours as needed for wheezing or shortness of breath., Disp: 360 mL, Rfl: 5   BREZTRI AEROSPHERE 160-9-4.8 MCG/ACT AERO, TAKE 2 PUFFS BY MOUTH TWICE A DAY, Disp: 10.7 each, Rfl: 1   Calcium Carb-Cholecalciferol (CALCIUM 600+D3 PO), Take 1 tablet by mouth daily., Disp: , Rfl:    calcium carbonate (TUMS EX) 750 MG chewable tablet, Chew 750-1,500 mg by mouth 3 (three) times daily as needed (for heartburn/indigestion.)., Disp: , Rfl:    clotrimazole (MYCELEX) 10 MG troche, Take 10 mg by mouth 5 (five) times daily as needed (thrush)., Disp: , Rfl:    Ipratropium-Albuterol (COMBIVENT) 20-100 MCG/ACT AERS respimat, Inhale 1 puff into the lungs every 6 (six) hours., Disp: 4 g, Rfl: 11   Melatonin 5 MG TABS, Take 5 mg by mouth at bedtime as needed (for sleep.). , Disp: , Rfl:    Multiple Minerals-Vitamins (BONE ESSENTIALS) CAPS, Take 1 tablet by mouth daily., Disp: , Rfl:    Multiple Vitamin (MULTIVITAMIN WITH MINERALS) TABS tablet, Take 1 tablet by mouth daily., Disp: , Rfl:    naproxen sodium (ALEVE) 220 MG tablet, Take 440 mg by mouth daily as needed (pain)., Disp: , Rfl:    OXYGEN, Inhale 2 L into the lungs at bedtime. Bedtime and as needed, Disp: , Rfl:    triamcinolone cream (KENALOG) 0.1 %, Apply 1 application topically 2 (  two) times daily as needed (for skin irritation/contact dermatitis)., Disp: 453.6 g, Rfl: 1   vitamin B-12 (CYANOCOBALAMIN) 1000 MCG tablet, Take 1,000 mcg by mouth daily., Disp: , Rfl:   Allergies  Allergen Reactions   Prolia [Denosumab] Other (See Comments)    Joint pain   Anastrozole     Joint pain   Letrozole     Joint pain   Tamoxifen     Joint pain    Objective:   BP 114/65   Pulse 84   Temp (!) 97.5 F (36.4 C) (Temporal)   Ht 5' 4" (1.626 m)   Wt 168 lb 9.6 oz (76.5 kg)   SpO2 (!) 76%   BMI 28.94 kg/m    Physical Exam Vitals reviewed.  Constitutional:      General: She is not  in acute distress.    Appearance: Normal appearance. She is not ill-appearing, toxic-appearing or diaphoretic.  HENT:     Head: Normocephalic and atraumatic.  Eyes:     General: No scleral icterus.       Right eye: No discharge.        Left eye: No discharge.     Conjunctiva/sclera: Conjunctivae normal.  Cardiovascular:     Rate and Rhythm: Normal rate and regular rhythm.     Heart sounds: Normal heart sounds. No murmur heard.    No friction rub. No gallop.  Pulmonary:     Effort: Pulmonary effort is normal. No respiratory distress.     Breath sounds: No stridor. Examination of the right-upper field reveals wheezing. Examination of the right-middle field reveals wheezing. Examination of the right-lower field reveals wheezing. Wheezing present. No rhonchi or rales.  Musculoskeletal:        General: Normal range of motion.     Cervical back: Normal range of motion.     Right ankle: Swelling present. Normal pulse.     Left ankle: Swelling present. Normal pulse.     Right foot: Normal pulse.     Left foot: Normal pulse.  Skin:    General: Skin is warm and dry.     Capillary Refill: Capillary refill takes less than 2 seconds.  Neurological:     General: No focal deficit present.     Mental Status: She is alert and oriented to person, place, and time. Mental status is at baseline.  Psychiatric:        Mood and Affect: Mood normal.        Behavior: Behavior normal.        Thought Content: Thought content normal.        Judgment: Judgment normal.

## 2021-09-29 LAB — CMP14+EGFR
ALT: 9 IU/L (ref 0–32)
AST: 14 IU/L (ref 0–40)
Albumin/Globulin Ratio: 1.5 (ref 1.2–2.2)
Albumin: 4 g/dL (ref 3.8–4.8)
Alkaline Phosphatase: 89 IU/L (ref 44–121)
BUN/Creatinine Ratio: 16 (ref 12–28)
BUN: 10 mg/dL (ref 8–27)
Bilirubin Total: 0.3 mg/dL (ref 0.0–1.2)
CO2: 35 mmol/L — ABNORMAL HIGH (ref 20–29)
Calcium: 9.2 mg/dL (ref 8.7–10.3)
Chloride: 95 mmol/L — ABNORMAL LOW (ref 96–106)
Creatinine, Ser: 0.63 mg/dL (ref 0.57–1.00)
Globulin, Total: 2.6 g/dL (ref 1.5–4.5)
Glucose: 89 mg/dL (ref 70–99)
Potassium: 4.4 mmol/L (ref 3.5–5.2)
Sodium: 144 mmol/L (ref 134–144)
Total Protein: 6.6 g/dL (ref 6.0–8.5)
eGFR: 93 mL/min/{1.73_m2} (ref >59)

## 2021-09-29 LAB — ANEMIA PROFILE B
Basophils Absolute: 0 10*3/uL (ref 0.0–0.2)
Basos: 1 %
EOS (ABSOLUTE): 0.1 10*3/uL (ref 0.0–0.4)
Eos: 3 %
Ferritin: 78 ng/mL (ref 15–150)
Folate: >20 ng/mL (ref >3.0)
Hematocrit: 36.9 % (ref 34.0–46.6)
Hemoglobin: 11.9 g/dL (ref 11.1–15.9)
Immature Grans (Abs): 0 10*3/uL (ref 0.0–0.1)
Immature Granulocytes: 0 %
Iron Saturation: 24 % (ref 15–55)
Iron: 62 ug/dL (ref 27–139)
Lymphocytes Absolute: 1.2 10*3/uL (ref 0.7–3.1)
Lymphs: 30 %
MCH: 30.4 pg (ref 26.6–33.0)
MCHC: 32.2 g/dL (ref 31.5–35.7)
MCV: 94 fL (ref 79–97)
Monocytes Absolute: 0.5 10*3/uL (ref 0.1–0.9)
Monocytes: 12 %
Neutrophils Absolute: 2.1 10*3/uL (ref 1.4–7.0)
Neutrophils: 54 %
Platelets: 159 10*3/uL (ref 150–450)
RBC: 3.91 x10E6/uL (ref 3.77–5.28)
RDW: 13.5 % (ref 11.7–15.4)
Retic Ct Pct: 1.3 % (ref 0.6–2.6)
Total Iron Binding Capacity: 258 ug/dL (ref 250–450)
UIBC: 196 ug/dL (ref 118–369)
Vitamin B-12: 498 pg/mL (ref 232–1245)
WBC: 3.9 10*3/uL (ref 3.4–10.8)

## 2021-09-29 LAB — BRAIN NATRIURETIC PEPTIDE: BNP: 44.3 pg/mL (ref 0.0–100.0)

## 2021-09-29 LAB — TSH: TSH: 0.577 u[IU]/mL (ref 0.450–4.500)

## 2021-10-05 ENCOUNTER — Ambulatory Visit: Payer: Medicare Other | Admitting: Pulmonary Disease

## 2021-10-08 ENCOUNTER — Other Ambulatory Visit: Payer: Self-pay | Admitting: Pulmonary Disease

## 2021-10-10 DIAGNOSIS — J449 Chronic obstructive pulmonary disease, unspecified: Secondary | ICD-10-CM | POA: Diagnosis not present

## 2021-10-11 ENCOUNTER — Telehealth: Payer: Self-pay | Admitting: Pulmonary Disease

## 2021-10-11 MED ORDER — BREZTRI AEROSPHERE 160-9-4.8 MCG/ACT IN AERO: INHALATION_SPRAY | RESPIRATORY_TRACT | 6 refills | Status: DC

## 2021-10-11 NOTE — Telephone Encounter (Signed)
Breztri inhaler refilled. ATC patient to notify. Left a detailed vm letting her know inhaler refill was sent ot [facility]. Nothing further needed at this time.

## 2021-10-12 ENCOUNTER — Ambulatory Visit: Payer: Medicare Other | Admitting: Family Medicine

## 2021-10-28 DIAGNOSIS — J449 Chronic obstructive pulmonary disease, unspecified: Secondary | ICD-10-CM | POA: Diagnosis not present

## 2021-10-29 DIAGNOSIS — J449 Chronic obstructive pulmonary disease, unspecified: Secondary | ICD-10-CM | POA: Diagnosis not present

## 2021-11-10 ENCOUNTER — Ambulatory Visit: Payer: Medicare Other | Admitting: Family Medicine

## 2021-11-10 DIAGNOSIS — J449 Chronic obstructive pulmonary disease, unspecified: Secondary | ICD-10-CM | POA: Diagnosis not present

## 2021-11-22 ENCOUNTER — Ambulatory Visit: Payer: Medicare Other | Admitting: Family Medicine

## 2021-11-27 DIAGNOSIS — J449 Chronic obstructive pulmonary disease, unspecified: Secondary | ICD-10-CM | POA: Diagnosis not present

## 2021-11-28 DIAGNOSIS — J449 Chronic obstructive pulmonary disease, unspecified: Secondary | ICD-10-CM | POA: Diagnosis not present

## 2021-12-03 ENCOUNTER — Encounter: Payer: Self-pay | Admitting: Pulmonary Disease

## 2021-12-03 ENCOUNTER — Ambulatory Visit: Payer: Medicare Other | Admitting: Pulmonary Disease

## 2021-12-03 VITALS — BP 134/78 | HR 72 | Temp 97.7°F | Ht 64.0 in | Wt 166.8 lb

## 2021-12-03 DIAGNOSIS — J4489 Other specified chronic obstructive pulmonary disease: Secondary | ICD-10-CM | POA: Diagnosis not present

## 2021-12-03 DIAGNOSIS — Z720 Tobacco use: Secondary | ICD-10-CM | POA: Diagnosis not present

## 2021-12-03 DIAGNOSIS — J439 Emphysema, unspecified: Secondary | ICD-10-CM

## 2021-12-03 DIAGNOSIS — J9611 Chronic respiratory failure with hypoxia: Secondary | ICD-10-CM | POA: Diagnosis not present

## 2021-12-03 NOTE — Progress Notes (Signed)
[facility]  Chief Complaint  Patient presents with   Follow-up    Sob has worsened     Constitutional:  BP 134/78 (BP Location: Left Arm, Patient Position: Sitting)   Pulse 72   Temp 97.7 F (36.5 C)   Ht '5\' 4"'$  (1.626 m)   Wt 166 lb 12.8 oz (75.7 kg)   SpO2 (!) 87% Comment: 3LO2 pulse  BMI 28.63 kg/m   Past Medical History:  Lt breast cancer s/p lumpectomy January 2020, Osteoporosis, HLD, Pneumonia  Past Surgical History:  She  has a past surgical history that includes Cesarean section; Knee surgery (Right); Tonsillectomy; Breast surgery; Colonoscopy; Total knee arthroplasty (Right, 06/27/2017); Breast lumpectomy with radioactive seed and sentinel lymph node biopsy (Left, 03/05/2018); Eye surgery (Bilateral); Esophagogastroduodenoscopy (egd) with propofol (N/A, 03/03/2020); Balloon dilation (N/A, 03/03/2020); biopsy (03/03/2020); and Esophageal brushing (03/03/2020).  Brief Summary:  Joanne Kramer is a 76 y.o. female smoker with COPD and chronic respiratory failure.      Subjective:   She was able to quit smoke for about 3 weeks, but then started smoking again.    Has cough with thick, white sputum.  Albuterol HFA didn't help.  Started using combivent again but this is very expensive.  She has a nebulizer, but hasn't been using this.  Sleeping okay.  Uses continuous flow oxygen at home and SpO2 stays above 90%.  Physical Exam:   Appearance - well kempt, wearing oxygen  ENMT - no sinus tenderness, no oral exudate, no LAN, Mallampati 3 airway, no stridor  Respiratory - equal breath sounds bilaterally, no wheezing or rales  CV - s1s2 regular rate and rhythm, no murmurs  Ext - no clubbing, no edema  Skin - no rashes  Psych - normal mood and affect     Pulmonary testing:  PFT 08/20/19 >> FEV1 0.67 (29%), FEV1% 42, TLC 5.79 (111%), RV 4.08 (179%), DLCO 45%  Chest Imaging:  LDCT 08/29/19 >> coronary calcification, 4 cm ascending  aorta, centrilobular emphysema, innumerable tiny upper lung GGO, calcified granuloma RUL, 6.6 mm nodule RLL LDCT 04/06/20 >> small foci of tree in bud in Lt apex, otherwise no change LDCT 05/13/21 >> no change LDCT chest 05/13/21 >> ascending aorta 4 cm, mod CM, atherosclerosis, mod centrilobular emphysema, nodules up to 7.1 mm stable  Social History:  She  reports that she has been smoking cigarettes. She has a 27.50 pack-year smoking history. She has never used smokeless tobacco. She reports that she does not drink alcohol and does not use drugs.  Family History:  Her family history is not on file. She was adopted.     Assessment/Plan:   COPD with chronic bronchitis and emphysema. - continue breztri - prn combivent and albuterol - advised her to try using her nebulizer more when at home since this is less expensive than combivent - flutter valve and mucinex bid prn when having more cough and chest congestion   Chronic respiratory failure with hypoxia. - uses [employer] for DME - goal SpO2 90% - needs 3 liters continuous flow oxygen   Tobacco abuse. - intolerant of chantix due to nausea - discussed options to help with smoking cessation - she will try electronic cigarettes  Time Spent Involved in Patient Care on Day of Examination:  36 minutes  Follow up:   Patient Instructions  Will arrange for a flutter valve  You can use mucinex, breztri then flutter valve twice per day when you have more  trouble cough and chest congestion.  Otherwise just continue breztri twice per day.  Try using electronic cigarettes to help stop smoking.  Follow up in 4 to 5 months.  Medication List:   Allergies as of 12/03/2021       Reactions   Prolia [denosumab] Other (See Comments)   Joint pain   Anastrozole    Joint pain   Letrozole    Joint pain   Tamoxifen    Joint pain        Medication List        Accurate as of December 03, 2021  2:39 PM. If you have any questions, ask your  nurse or doctor.          STOP taking these medications    CALCIUM 600+D3 PO Stopped by: Chesley Mires, MD       TAKE these medications    albuterol (2.5 MG/3ML) 0.083% nebulizer solution Commonly known as: PROVENTIL Take 3 mLs (2.5 mg total) by nebulization every 6 (six) hours as needed for wheezing or shortness of breath.   Bone Essentials Caps Take 1 tablet by mouth daily.   Breztri Aerosphere 160-9-4.8 MCG/ACT Aero Generic drug: Budeson-Glycopyrrol-Formoterol TAKE 2 PUFFS BY MOUTH TWICE A DAY   calcium carbonate 750 MG chewable tablet Commonly known as: TUMS EX Chew 750-1,500 mg by mouth 3 (three) times daily as needed (for heartburn/indigestion.).   clotrimazole 10 MG troche Commonly known as: MYCELEX Take 10 mg by mouth 5 (five) times daily as needed (thrush).   cyanocobalamin 1000 MCG tablet Commonly known as: VITAMIN B12 Take 1,000 mcg by mouth daily.   Ipratropium-Albuterol 20-100 MCG/ACT Aers respimat Commonly known as: COMBIVENT Inhale 1 puff into the lungs every 6 (six) hours.   melatonin 5 MG Tabs Take 5 mg by mouth at bedtime as needed (for sleep.).   multivitamin with minerals Tabs tablet Take 1 tablet by mouth daily.   naproxen sodium 220 MG tablet Commonly known as: ALEVE Take 440 mg by mouth daily as needed (pain).   OXYGEN Inhale 2 L into the lungs at bedtime. Bedtime and as needed   triamcinolone cream 0.1 % Commonly known as: KENALOG Apply 1 application topically 2 (two) times daily as needed (for skin irritation/contact dermatitis).        Signature:  Chesley Mires, MD [place] Pager - [phone] 12/03/2021, 2:39 PM

## 2021-12-03 NOTE — Patient Instructions (Signed)
Will arrange for a flutter valve  You can use mucinex, breztri then flutter valve twice per day when you have more trouble cough and chest congestion.  Otherwise just continue breztri twice per day.  Try using electronic cigarettes to help stop smoking.  Follow up in 4 to 5 months.

## 2021-12-10 DIAGNOSIS — J449 Chronic obstructive pulmonary disease, unspecified: Secondary | ICD-10-CM | POA: Diagnosis not present

## 2021-12-28 DIAGNOSIS — J449 Chronic obstructive pulmonary disease, unspecified: Secondary | ICD-10-CM | POA: Diagnosis not present

## 2021-12-29 DIAGNOSIS — J449 Chronic obstructive pulmonary disease, unspecified: Secondary | ICD-10-CM | POA: Diagnosis not present

## 2022-01-10 DIAGNOSIS — J449 Chronic obstructive pulmonary disease, unspecified: Secondary | ICD-10-CM | POA: Diagnosis not present

## 2022-01-17 ENCOUNTER — Inpatient Hospital Stay: Payer: Medicare Other

## 2022-01-17 ENCOUNTER — Ambulatory Visit (HOSPITAL_COMMUNITY): Payer: Medicare Other

## 2022-01-24 ENCOUNTER — Ambulatory Visit: Payer: Medicare Other | Admitting: Physician Assistant

## 2022-01-27 DIAGNOSIS — J449 Chronic obstructive pulmonary disease, unspecified: Secondary | ICD-10-CM | POA: Diagnosis not present

## 2022-01-28 DIAGNOSIS — J449 Chronic obstructive pulmonary disease, unspecified: Secondary | ICD-10-CM | POA: Diagnosis not present

## 2022-02-09 DIAGNOSIS — J449 Chronic obstructive pulmonary disease, unspecified: Secondary | ICD-10-CM | POA: Diagnosis not present

## 2022-02-17 ENCOUNTER — Encounter: Payer: Self-pay | Admitting: Family

## 2022-02-17 ENCOUNTER — Other Ambulatory Visit: Payer: Self-pay

## 2022-02-17 ENCOUNTER — Emergency Department (HOSPITAL_COMMUNITY): Payer: Medicare Other

## 2022-02-17 ENCOUNTER — Ambulatory Visit (INDEPENDENT_AMBULATORY_CARE_PROVIDER_SITE_OTHER): Payer: Medicare Other | Admitting: Family

## 2022-02-17 ENCOUNTER — Emergency Department (HOSPITAL_COMMUNITY)
Admission: EM | Admit: 2022-02-17 | Discharge: 2022-02-17 | Discharge status: Against Medical Advice | Payer: Medicare Other | Attending: Emergency Medicine | Admitting: Emergency Medicine

## 2022-02-17 VITALS — BP 138/89 | HR 89 | Temp 97.3°F | Ht 64.0 in | Wt 164.0 lb

## 2022-02-17 DIAGNOSIS — J9611 Chronic respiratory failure with hypoxia: Secondary | ICD-10-CM | POA: Diagnosis not present

## 2022-02-17 DIAGNOSIS — R059 Cough, unspecified: Secondary | ICD-10-CM | POA: Insufficient documentation

## 2022-02-17 DIAGNOSIS — R0902 Hypoxemia: Secondary | ICD-10-CM

## 2022-02-17 DIAGNOSIS — Z743 Need for continuous supervision: Secondary | ICD-10-CM | POA: Diagnosis not present

## 2022-02-17 DIAGNOSIS — Z5321 Procedure and treatment not carried out due to patient leaving prior to being seen by health care provider: Secondary | ICD-10-CM | POA: Diagnosis not present

## 2022-02-17 DIAGNOSIS — R600 Localized edema: Secondary | ICD-10-CM | POA: Diagnosis not present

## 2022-02-17 DIAGNOSIS — R0602 Shortness of breath: Secondary | ICD-10-CM | POA: Insufficient documentation

## 2022-02-17 DIAGNOSIS — Z720 Tobacco use: Secondary | ICD-10-CM

## 2022-02-17 DIAGNOSIS — Z1152 Encounter for screening for COVID-19: Secondary | ICD-10-CM | POA: Diagnosis not present

## 2022-02-17 DIAGNOSIS — J441 Chronic obstructive pulmonary disease with (acute) exacerbation: Secondary | ICD-10-CM

## 2022-02-17 DIAGNOSIS — J439 Emphysema, unspecified: Secondary | ICD-10-CM | POA: Diagnosis not present

## 2022-02-17 LAB — CBC WITH DIFFERENTIAL/PLATELET
Abs Immature Granulocytes: 0.03 10*3/uL (ref 0.00–0.07)
Basophils Absolute: 0 10*3/uL (ref 0.0–0.1)
Basophils Relative: 1 %
Eosinophils Absolute: 0.1 10*3/uL (ref 0.0–0.5)
Eosinophils Relative: 2 %
HCT: 40 % (ref 36.0–46.0)
Hemoglobin: 11.9 g/dL — ABNORMAL LOW (ref 12.0–15.0)
Immature Granulocytes: 1 %
Lymphocytes Relative: 19 %
Lymphs Abs: 1.2 10*3/uL (ref 0.7–4.0)
MCH: 30.7 pg (ref 26.0–34.0)
MCHC: 29.8 g/dL — ABNORMAL LOW (ref 30.0–36.0)
MCV: 103.1 fL — ABNORMAL HIGH (ref 80.0–100.0)
Monocytes Absolute: 0.6 10*3/uL (ref 0.1–1.0)
Monocytes Relative: 10 %
Neutro Abs: 4.3 10*3/uL (ref 1.7–7.7)
Neutrophils Relative %: 67 %
Platelets: 184 10*3/uL (ref 150–400)
RBC: 3.88 MIL/uL (ref 3.87–5.11)
RDW: 14.1 % (ref 11.5–15.5)
WBC: 6.3 10*3/uL (ref 4.0–10.5)
nRBC: 0 % (ref 0.0–0.2)

## 2022-02-17 LAB — BASIC METABOLIC PANEL
Anion gap: 8 (ref 5–15)
BUN: 10 mg/dL (ref 8–23)
CO2: 39 mmol/L — ABNORMAL HIGH (ref 22–32)
Calcium: 9.3 mg/dL (ref 8.9–10.3)
Chloride: 90 mmol/L — ABNORMAL LOW (ref 98–111)
Creatinine, Ser: 0.52 mg/dL (ref 0.44–1.00)
GFR, Estimated: >60 mL/min (ref >60)
Glucose, Bld: 96 mg/dL (ref 70–99)
Potassium: 3.6 mmol/L (ref 3.5–5.1)
Sodium: 137 mmol/L (ref 135–145)

## 2022-02-17 LAB — RESP PANEL BY RT-PCR (RSV, FLU A&B, COVID)  RVPGX2
Influenza A by PCR: NEGATIVE
Influenza B by PCR: NEGATIVE
Resp Syncytial Virus by PCR: NEGATIVE
SARS Coronavirus 2 by RT PCR: NEGATIVE

## 2022-02-17 LAB — BRAIN NATRIURETIC PEPTIDE: B Natriuretic Peptide: 52 pg/mL (ref 0.0–100.0)

## 2022-02-17 MED ORDER — METHYLPREDNISOLONE SODIUM SUCC 125 MG IJ SOLR
125.0000 mg | Freq: Once | INTRAMUSCULAR | Status: DC
  Filled 2022-02-17: qty 2

## 2022-02-17 MED ORDER — IPRATROPIUM-ALBUTEROL 0.5-2.5 (3) MG/3ML IN SOLN
3.0000 mL | Freq: Once | RESPIRATORY_TRACT | Status: DC
  Filled 2022-02-17: qty 3

## 2022-02-17 NOTE — Progress Notes (Signed)
Subjective:    Patient ID: Joanne Kramer, female    DOB: [birth year], 76 y.o.   MRN: [id number]  Chief Complaint  Patient presents with   Cough    Been going on for a week    Nasal Congestion   Pt presents to the office today with cough that has been on going for a week. She has COPD and wears 3L of O2. She tested negative last week for COVID and flu.  Cough This is a new problem. The current episode started 1 to 4 weeks ago. The problem has been gradually worsening. The problem occurs every few minutes. The cough is Productive of sputum and productive of purulent sputum. Associated symptoms include headaches, nasal congestion, postnasal drip, rhinorrhea, a sore throat, shortness of breath and wheezing. Pertinent negatives include no chills, ear congestion, ear pain, fever or myalgias. Risk factors for lung disease include smoking/tobacco exposure. She has tried rest for the symptoms. The treatment provided mild relief.      Review of Systems  Constitutional:  Negative for chills and fever.  HENT:  Positive for postnasal drip, rhinorrhea and sore throat. Negative for ear pain.   Respiratory:  Positive for cough, shortness of breath and wheezing.   Musculoskeletal:  Negative for myalgias.  Neurological:  Positive for headaches.  All other systems reviewed and are negative.      Objective:   Physical Exam Vitals reviewed.  Constitutional:      General: She is not in acute distress.    Appearance: She is well-developed.  HENT:     Head: Normocephalic and atraumatic.  Eyes:     Pupils: Pupils are equal, round, and reactive to light.  Neck:     Thyroid: No thyromegaly.  Cardiovascular:     Rate and Rhythm: Normal rate and regular rhythm.     Heart sounds: Normal heart sounds. No murmur heard. Pulmonary:     Effort: Pulmonary effort is normal. No respiratory distress.     Breath sounds: Decreased air movement present. Wheezing and rhonchi present.     Comments:  3L Abdominal:     General: Bowel sounds are normal. There is no distension.     Palpations: Abdomen is soft.     Tenderness: There is no abdominal tenderness.  Musculoskeletal:        General: No tenderness. Normal range of motion.     Cervical back: Normal range of motion and neck supple.  Skin:    General: Skin is warm and dry.  Neurological:     Mental Status: She is alert and oriented to person, place, and time.     Cranial Nerves: No cranial nerve deficit.     Deep Tendon Reflexes: Reflexes are normal and symmetric.  Psychiatric:        Behavior: Behavior normal.        Thought Content: Thought content normal.        Judgment: Judgment normal.      BP 138/89   Pulse 89   Temp (!) 97.3 F (36.3 C) (Temporal)   Ht '5\' 4"'$  (1.626 m)   Wt 164 lb (74.4 kg)   SpO2 (!) 77%   BMI 28.15 kg/m       Assessment & Plan:  Mekiah Wahler comes in today with chief complaint of Cough (Been going on for a week ) and Nasal Congestion   Diagnosis and orders addressed:  1. Chronic obstructive pulmonary disease with acute exacerbation ([place])  2. Hypoxia  3.  Tobacco abuse  4. Chronic respiratory failure with hypoxia ([facility])  O2 Sat 70%, reviewing chart her O2 does decrease to 80% Worrisome for CAP given cough > week and decreased O2 sat will call EMS and send to ED.  Follow after discharge from [facility], [place]

## 2022-02-17 NOTE — ED Provider Triage Note (Signed)
Emergency Medicine Provider Triage Evaluation Note  Joanne Kramer , a 76 y.o. female  was evaluated in triage.  Pt complains of worsening COPD flare.  She was seen by her primary provider this morning at which time she had a pulse ox down to 70%, this is on her portable concentrator at 3 L.  She states her oxygen level is not as good on the portable concentrator as it is on her home concentrator as she cannot get 100% oxygen through this device.  However she has had increasing shortness of breath over the past week.  She is not had wheezing but has had nonproductive cough..  She does have ankle edema, which initially started this past summer, to her knowledge she does not have a history of CHF.  Review of Systems  Positive: Shortness of breath, cough, ankle edema Negative: Wheezing, fever.  Chest pain  Physical Exam  BP 128/76 (BP Location: Right Arm)   Pulse 90   Temp 97.8 F (36.6 C) (Oral)   Resp 18   SpO2 99%  Gen:   Awake, no distress   Resp:  Normal effort normal respiratory effort, reduced aeration, distant breath sounds throughout all lung fields.  No wheezing. MSK:   Moves extremities without difficulty  Other:  Bilateral ankle edema.  Medical Decision Making  Medically screening exam initiated at 4:20 PM.  Appropriate orders placed.  Joanne Kramer was informed that the remainder of the evaluation will be completed by another provider, this initial triage assessment does not replace that evaluation, and the importance of remaining in the ED until their evaluation is complete.  Patient appears relatively stable at this time, initial labs have resulted, she has a negative respiratory panel and a normal WBC count.  She would benefit from steroids and a neb treatment which has been ordered.  BNP also added given the peripheral edema.   Joanne Jefferson, PA-C 02/17/22 1624

## 2022-02-17 NOTE — Patient Instructions (Signed)
Hypoxia Hypoxia is a condition that happens when there is a lack of oxygen in the body's tissues and organs. When there is not enough oxygen, organs cannot work as they should. This causes serious problems throughout the body and in the brain. What are the causes? This condition may be caused by: Exposure to high altitude. A collapsed lung (pneumothorax). Lung infection (pneumonia). Lung injury. Long-term (chronic) lung disease, such as chronic obstructive pulmonary disease (COPD) or emphysema. Fluid collecting in the chest cavity (congestive heart failure), or blood collecting in the chest cavity (hemothorax). Food, saliva, or vomit getting into the airway (aspiration). Reduced blood flow (ischemia). Severe blood loss. Slow or shallow breathing (hypoventilation). Blood disorders, such as anemia. Carbon monoxide or cyanide poisoning. The heart suddenly stopping (cardiac arrest). Medicines or recreational drugs with severe sedating effects. Drowning. Choking. What are the signs or symptoms? Symptoms of this condition include: Headache. Feeling tired (fatigue). Forgetfulness. Nausea. Confusion. Shortness of breath. Dizziness. Bluish color of the skin, lips, or nail beds (cyanosis). Change in consciousness or awareness. If hypoxia is not treated, it can lead to convulsions, loss of consciousness (coma), or brain damage, which can be life-threatening. How is this diagnosed? This condition may be diagnosed based on: A physical exam. Blood tests. A test that measures how much oxygen is in your blood (pulse oximetry). This is done with a sensor that is placed on your finger, toe, or earlobe. Imaging, such as a chest X-ray or CT scan. Tests to check your lung function (pulmonary function tests). A test to check the electrical activity of your heart (electrocardiogram, ECG). You may have other tests to determine the cause of your hypoxia. How is this treated?  Treatment for this  condition depends on what is causing the hypoxia. You will likely be treated with oxygen therapy. This may be done by giving you oxygen through a face mask or through tubes in your nose. Your health care provider may also recommend other therapies to treat the underlying cause of your hypoxia. Follow these instructions at home: Take over-the-counter and prescription medicines only as told by your health care provider. Do not use any products that contain nicotine or tobacco. These products include cigarettes, chewing tobacco, and vaping devices, such as e-cigarettes. If you need help quitting, ask your health care provider. Avoid secondhand smoke. Work with your health care provider to manage any chronic conditions you have that may be causing hypoxia, such as COPD. Keep all follow-up visits. This is important. Contact a health care provider if: You have a fever. You become extremely short of breath when you exercise. Get help right away if: Your shortness of breath gets worse, especially with normal or very little activity. You have trouble breathing, even after treatment. Your skin, lips, or nail beds have a bluish color. You become confused or you cannot think properly. You have chest pain. These symptoms may be an emergency. Get help right away. Call 911. Do not wait to see if the symptoms will go away. Do not drive yourself to the hospital. Summary Hypoxia is a condition that happens when there is a lack of oxygen in the body's tissues and organs. If hypoxia is not treated, it can lead to convulsions, loss of consciousness (coma), or brain damage. Symptoms of hypoxia can include a headache, shortness of breath, confusion, nausea, and a bluish skin color. Hypoxia has many possible causes, including exposure to high altitude, carbon monoxide poisoning, or other health issues, such as blood disorders or   cardiac arrest. Hypoxia is usually treated with oxygen therapy. This information is  not intended to replace advice given to you by your health care provider. Make sure you discuss any questions you have with your health care provider. Document Revised: 09/15/2020 Document Reviewed: 09/15/2020 Elsevier Patient Education  2023 Elsevier Inc.  

## 2022-02-17 NOTE — ED Triage Notes (Signed)
Pt c/o cough, sore rib cage, sore throat. Pt on chronic 02 at 3L. Pt c/o worsening sob than usual with her COPD x days. Pt had pulse ox of 70s at pcp today per pt. Pt in nad in triage with nonlabored breathing or no obvious sob noted.

## 2022-02-27 DIAGNOSIS — J449 Chronic obstructive pulmonary disease, unspecified: Secondary | ICD-10-CM | POA: Diagnosis not present

## 2022-02-28 DIAGNOSIS — J449 Chronic obstructive pulmonary disease, unspecified: Secondary | ICD-10-CM | POA: Diagnosis not present

## 2022-03-11 NOTE — Progress Notes (Signed)
Attempted to call patient to schedule follow-up LDCT. Unable to reach the patient at this time, detailed VM left asking that the patient return my call.  

## 2022-03-12 DIAGNOSIS — J449 Chronic obstructive pulmonary disease, unspecified: Secondary | ICD-10-CM | POA: Diagnosis not present

## 2022-03-30 DIAGNOSIS — J449 Chronic obstructive pulmonary disease, unspecified: Secondary | ICD-10-CM | POA: Diagnosis not present

## 2022-03-31 DIAGNOSIS — J449 Chronic obstructive pulmonary disease, unspecified: Secondary | ICD-10-CM | POA: Diagnosis not present

## 2022-04-12 DIAGNOSIS — J449 Chronic obstructive pulmonary disease, unspecified: Secondary | ICD-10-CM | POA: Diagnosis not present

## 2022-04-14 NOTE — Progress Notes (Signed)
Attempted to call patient to schedule follow-up LDCT. Unable to reach the patient at this time, detailed VM left asking that the patient return my call.

## 2022-04-22 ENCOUNTER — Encounter (HOSPITAL_COMMUNITY): Payer: Self-pay | Admitting: Family Medicine

## 2022-04-22 DIAGNOSIS — Z1231 Encounter for screening mammogram for malignant neoplasm of breast: Secondary | ICD-10-CM

## 2022-04-28 DIAGNOSIS — J449 Chronic obstructive pulmonary disease, unspecified: Secondary | ICD-10-CM | POA: Diagnosis not present

## 2022-05-10 ENCOUNTER — Other Ambulatory Visit: Payer: Self-pay | Admitting: Pulmonary Disease

## 2022-05-11 DIAGNOSIS — J449 Chronic obstructive pulmonary disease, unspecified: Secondary | ICD-10-CM | POA: Diagnosis not present

## 2022-05-16 ENCOUNTER — Ambulatory Visit: Payer: Medicare Other | Admitting: Pulmonary Disease

## 2022-05-29 DIAGNOSIS — J449 Chronic obstructive pulmonary disease, unspecified: Secondary | ICD-10-CM | POA: Diagnosis not present

## 2022-06-09 ENCOUNTER — Other Ambulatory Visit: Payer: Self-pay | Admitting: Pulmonary Disease

## 2022-06-11 DIAGNOSIS — J449 Chronic obstructive pulmonary disease, unspecified: Secondary | ICD-10-CM | POA: Diagnosis not present

## 2022-06-28 ENCOUNTER — Encounter: Payer: Self-pay | Admitting: Pulmonary Disease

## 2022-06-28 ENCOUNTER — Ambulatory Visit: Payer: Medicare Other | Admitting: Pulmonary Disease

## 2022-06-28 VITALS — BP 122/76 | HR 87 | Ht 64.0 in | Wt 153.0 lb

## 2022-06-28 DIAGNOSIS — J9611 Chronic respiratory failure with hypoxia: Secondary | ICD-10-CM | POA: Diagnosis not present

## 2022-06-28 DIAGNOSIS — J449 Chronic obstructive pulmonary disease, unspecified: Secondary | ICD-10-CM

## 2022-06-28 NOTE — Patient Instructions (Signed)
Will see if you can get an [employer] oxygen system  Follow up in 6 months

## 2022-06-28 NOTE — Progress Notes (Signed)
[facility]  Chief Complaint  Patient presents with   Follow-up    Breathing is unchanged. She does not feel that her POC is giving her enough o2.     Constitutional:  BP 122/76 (BP Location: Left Arm, Cuff Size: Normal)   Pulse 87   Ht 5\' 4"  (1.626 m)   Wt 153 lb (69.4 kg)   SpO2 90%   BMI 26.26 kg/m   Past Medical History:  Lt breast cancer s/p lumpectomy January 2020, Osteoporosis, HLD, Pneumonia  Past Surgical History:  She  has a past surgical history that includes Cesarean section; Knee surgery (Right); Tonsillectomy; Breast surgery; Colonoscopy; Total knee arthroplasty (Right, 06/27/2017); Breast lumpectomy with radioactive seed and sentinel lymph node biopsy (Left, 03/05/2018); Eye surgery (Bilateral); Esophagogastroduodenoscopy (egd) with propofol (N/A, 03/03/2020); Balloon dilation (N/A, 03/03/2020); biopsy (03/03/2020); and Esophageal brushing (03/03/2020).  Brief Summary:  Joanne Kramer is a 77 y.o. female smoker with COPD and chronic respiratory failure.      Subjective:   She purchased a POC on her own.  Only goes up to 2 liters.  Her SpO2 walking in today was in the 80's.  She tried the POC from her DME but it was too bulky.  Still smoking.  Has occasional cough with clear sputum.  Not having wheeze, chest discomfort or leg swelling.  Hip causes trouble with her sleep, but breathing okay at night.  She had to hire a maid to help with house cleaning because she would get too short of breath.  She maintained SpO2 > 95% while walking in office today on 3 liters continuous flow.  Physical Exam:   Appearance - well kempt, wearing oxygen  ENMT - no sinus tenderness, no oral exudate, no LAN, Mallampati 3 airway, no stridor  Respiratory - decreased breath sounds bilaterally, no wheezing or rales  CV - s1s2 regular rate and rhythm, no murmurs  Ext - no clubbing, no edema  Skin - no rashes  Psych - normal mood and  affect      Pulmonary testing:  PFT 08/20/19 >> FEV1 0.67 (29%), FEV1% 42, TLC 5.79 (111%), RV 4.08 (179%), DLCO 45%  Chest Imaging:  LDCT 08/29/19 >> coronary calcification, 4 cm ascending aorta, centrilobular emphysema, innumerable tiny upper lung GGO, calcified granuloma RUL, 6.6 mm nodule RLL LDCT 04/06/20 >> small foci of tree in bud in Lt apex, otherwise no change LDCT 05/13/21 >> no change LDCT chest 05/13/21 >> ascending aorta 4 cm, mod CM, atherosclerosis, mod centrilobular emphysema, nodules up to 7.1 mm stable  Social History:  She  reports that she has been smoking cigarettes. She has a 27.50 pack-year smoking history. She has never used smokeless tobacco. She reports that she does not drink alcohol and does not use drugs.  Family History:  Her family history is not on file. She was adopted.     Assessment/Plan:   COPD with chronic bronchitis and emphysema. - continue breztri two puffs bid - prn combivent and albuterol - advised her to try using her nebulizer more when at home since this is less expensive than combivent - flutter valve and mucinex bid prn when having more cough and chest congestion   Chronic respiratory failure with hypoxia. - she would like to switch DME's and get an [employer] set up with a POC - goal SpO2 90% - needs 3 liters continuous flow oxygen 24/7   Tobacco abuse. - intolerant of chantix due to nausea - discussed  options to help with smoking cessation  Time Spent Involved in Patient Care on Day of Examination:  36 minutes  Follow up:   Patient Instructions  Will see if you can get an [employer] oxygen system  Follow up in 6 months  Medication List:   Allergies as of 06/28/2022       Reactions   Prolia [denosumab] Other (See Comments)   Joint pain   Anastrozole    Joint pain   Letrozole    Joint pain   Tamoxifen    Joint pain        Medication List        Accurate as of June 28, 2022  9:26 AM. If you have any questions, ask  your nurse or doctor.          STOP taking these medications    clotrimazole 10 MG troche Commonly known as: MYCELEX Stopped by: Coralyn Helling, MD       TAKE these medications    albuterol (2.5 MG/3ML) 0.083% nebulizer solution Commonly known as: PROVENTIL Take 3 mLs (2.5 mg total) by nebulization every 6 (six) hours as needed for wheezing or shortness of breath.   Bone Essentials Caps Take 1 tablet by mouth daily.   Breztri Aerosphere 160-9-4.8 MCG/ACT Aero Generic drug: Budeson-Glycopyrrol-Formoterol TAKE 2 PUFFS BY MOUTH TWICE A DAY   calcium carbonate 750 MG chewable tablet Commonly known as: TUMS EX Chew 750-1,500 mg by mouth 3 (three) times daily as needed (for heartburn/indigestion.).   Combivent Respimat 20-100 MCG/ACT Aers respimat Generic drug: Ipratropium-Albuterol INHALE 1 PUFF INTO THE LUNGS EVERY 6 (SIX) HOURS.   cyanocobalamin 1000 MCG tablet Commonly known as: VITAMIN B12 Take 1,000 mcg by mouth daily.   melatonin 5 MG Tabs Take 5 mg by mouth at bedtime as needed (for sleep.).   multivitamin with minerals Tabs tablet Take 1 tablet by mouth daily.   naproxen sodium 220 MG tablet Commonly known as: ALEVE Take 440 mg by mouth daily as needed (pain).   OXYGEN Inhale 2 L into the lungs at bedtime. Bedtime and as needed   triamcinolone cream 0.1 % Commonly known as: KENALOG Apply 1 application topically 2 (two) times daily as needed (for skin irritation/contact dermatitis).        Signature:  Coralyn Helling, MD [facility] Pager - [phone] 06/28/2022, 9:26 AM

## 2022-07-02 DIAGNOSIS — K5900 Constipation, unspecified: Secondary | ICD-10-CM | POA: Diagnosis not present

## 2022-07-11 ENCOUNTER — Other Ambulatory Visit (HOSPITAL_COMMUNITY): Payer: Self-pay | Admitting: Family Medicine

## 2022-07-11 DIAGNOSIS — J449 Chronic obstructive pulmonary disease, unspecified: Secondary | ICD-10-CM | POA: Diagnosis not present

## 2022-07-11 DIAGNOSIS — Z1231 Encounter for screening mammogram for malignant neoplasm of breast: Secondary | ICD-10-CM

## 2022-07-20 ENCOUNTER — Ambulatory Visit (HOSPITAL_COMMUNITY): Payer: Medicare Other

## 2022-07-29 DIAGNOSIS — J449 Chronic obstructive pulmonary disease, unspecified: Secondary | ICD-10-CM | POA: Diagnosis not present

## 2022-08-11 DIAGNOSIS — J449 Chronic obstructive pulmonary disease, unspecified: Secondary | ICD-10-CM | POA: Diagnosis not present

## 2022-08-28 DIAGNOSIS — J449 Chronic obstructive pulmonary disease, unspecified: Secondary | ICD-10-CM | POA: Diagnosis not present

## 2022-09-10 DIAGNOSIS — J449 Chronic obstructive pulmonary disease, unspecified: Secondary | ICD-10-CM | POA: Diagnosis not present

## 2022-09-21 ENCOUNTER — Ambulatory Visit (HOSPITAL_COMMUNITY): Payer: Medicare Other

## 2022-09-28 ENCOUNTER — Ambulatory Visit (HOSPITAL_COMMUNITY)
Admission: RE | Admit: 2022-09-28 | Discharge: 2022-09-28 | Disposition: A | Discharge status: Home / Self Care | Payer: Medicare Other | Source: Ambulatory Visit | Attending: Family Medicine | Admitting: Family Medicine

## 2022-09-28 DIAGNOSIS — Z1231 Encounter for screening mammogram for malignant neoplasm of breast: Secondary | ICD-10-CM | POA: Diagnosis not present

## 2022-09-28 DIAGNOSIS — J449 Chronic obstructive pulmonary disease, unspecified: Secondary | ICD-10-CM | POA: Diagnosis not present

## 2022-09-30 ENCOUNTER — Telehealth: Payer: Self-pay | Admitting: Pulmonary Disease

## 2022-09-30 DIAGNOSIS — J449 Chronic obstructive pulmonary disease, unspecified: Secondary | ICD-10-CM

## 2022-09-30 DIAGNOSIS — J9611 Chronic respiratory failure with hypoxia: Secondary | ICD-10-CM

## 2022-10-04 NOTE — Progress Notes (Signed)
VM received from patient requesting follow-up LCS. Attempted to call the patient back, unable to reach her directly. Detailed VM left asking that the patient return my call.

## 2022-10-04 NOTE — Telephone Encounter (Signed)
Okay to send order to [facility] to have her home oxygen set up changed back to pulse flow concentrator and have the [employer] POC removed.

## 2022-10-04 NOTE — Telephone Encounter (Signed)
New order placed

## 2022-10-11 DIAGNOSIS — J449 Chronic obstructive pulmonary disease, unspecified: Secondary | ICD-10-CM | POA: Diagnosis not present

## 2022-10-29 DIAGNOSIS — J449 Chronic obstructive pulmonary disease, unspecified: Secondary | ICD-10-CM | POA: Diagnosis not present

## 2022-11-11 DIAGNOSIS — J449 Chronic obstructive pulmonary disease, unspecified: Secondary | ICD-10-CM | POA: Diagnosis not present

## 2022-11-28 DIAGNOSIS — J449 Chronic obstructive pulmonary disease, unspecified: Secondary | ICD-10-CM | POA: Diagnosis not present

## 2022-12-16 ENCOUNTER — Telehealth: Payer: Self-pay | Admitting: Pulmonary Disease

## 2022-12-16 NOTE — Telephone Encounter (Signed)
We received request for records from [facility] on 12/06/22. Records were faxed on 12/07/22. I spoke with Joanne Kramer with [facility] today and he confirmed that they have the documentation

## 2022-12-21 ENCOUNTER — Telehealth: Payer: Self-pay | Admitting: Pulmonary Disease

## 2022-12-21 NOTE — Telephone Encounter (Signed)
Albuterol for Neb needed also and Breztri.  Pharm is [facility] in [place]

## 2022-12-21 NOTE — Telephone Encounter (Signed)
ATC patient- call was answered and I was able to hear breathing but no one spoke.

## 2022-12-21 NOTE — Telephone Encounter (Signed)
Ipratropium-Albuterol (COMBIVENT RESPIMAT) 20-100 MCG/ACT AERS respimat [[id number]]   Pt states we have not replied to Pharm request for this med. Please refill.

## 2022-12-21 NOTE — Telephone Encounter (Signed)
Patient is returning a call regarding her refills---she states she is having problems with her telephone-- you may have to speak very loudly----call [phone]

## 2022-12-22 MED ORDER — COMBIVENT RESPIMAT 20-100 MCG/ACT IN AERS: 1.0000 | INHALATION_SPRAY | Freq: Four times a day (QID) | RESPIRATORY_TRACT | 6 refills | Status: DC | PRN

## 2022-12-22 MED ORDER — ALBUTEROL SULFATE (2.5 MG/3ML) 0.083% IN NEBU: 2.5000 mg | INHALATION_SOLUTION | Freq: Four times a day (QID) | RESPIRATORY_TRACT | 5 refills | Status: DC | PRN

## 2022-12-22 MED ORDER — BREZTRI AEROSPHERE 160-9-4.8 MCG/ACT IN AERO: INHALATION_SPRAY | RESPIRATORY_TRACT | 6 refills | Status: DC

## 2022-12-22 NOTE — Telephone Encounter (Signed)
Please call patient around lunch time.  That may be the best time to reach her regarding her refills----[phone]

## 2022-12-22 NOTE — Telephone Encounter (Signed)
Combivent and albuterol solution sent to preferred pharmacy.  Patient is aware and voiced her understanding.  Nothing further needed.

## 2022-12-22 NOTE — Telephone Encounter (Signed)
She said it was her "albuterol solution"

## 2022-12-22 NOTE — Telephone Encounter (Signed)
Lm for patient. Needing to verify which nebulizer solution is needed.  Breztri sent to preferred pharmacy.

## 2023-01-18 ENCOUNTER — Ambulatory Visit: Payer: Medicare Other | Admitting: Internal Medicine

## 2023-01-18 ENCOUNTER — Encounter: Payer: Self-pay | Admitting: Internal Medicine

## 2023-01-18 VITALS — BP 122/81 | HR 100 | Ht 64.0 in | Wt 149.0 lb

## 2023-01-18 DIAGNOSIS — J449 Chronic obstructive pulmonary disease, unspecified: Secondary | ICD-10-CM

## 2023-01-18 DIAGNOSIS — F17210 Nicotine dependence, cigarettes, uncomplicated: Secondary | ICD-10-CM | POA: Diagnosis not present

## 2023-01-18 DIAGNOSIS — J9612 Chronic respiratory failure with hypercapnia: Secondary | ICD-10-CM | POA: Diagnosis not present

## 2023-01-18 DIAGNOSIS — J9611 Chronic respiratory failure with hypoxia: Secondary | ICD-10-CM | POA: Diagnosis not present

## 2023-01-18 MED ORDER — STIOLTO RESPIMAT 2.5-2.5 MCG/ACT IN AERS: 2.0000 | INHALATION_SPRAY | Freq: Every day | RESPIRATORY_TRACT | 11 refills | Status: DC

## 2023-01-18 MED ORDER — PREDNISONE 10 MG PO TABS: ORAL_TABLET | ORAL | 0 refills | Status: DC

## 2023-01-18 NOTE — Assessment & Plan Note (Signed)
Active smoker - PFT's  08/20/19  FEV1 0.67 (29 % ) ratio 0.42  p 0 % improvement from saba p breztri prior to study with DLCO  9.10 (45%)   and FV curve classsically concave    - Allergy screen 01/18/2023 >  Eos / alpha one phenotype - 01/18/2023  After extensive coaching inhaler device,  effectiveness =    75% with SMI >  change breztri to stiolto plus approp saba  Pt is Group B in terms of symptom/risk and laba/lama therefore appropriate rx at this point >>>  try stiolto pending Eos and ? Response to pred x 6 days   Re SABA :  I spent extra time with pt today reviewing appropriate use of albuterol for prn use on exertion with the following points: 1) saba is for relief of sob that does not improve by walking a slower pace or resting but rather if the pt does not improve after trying this first. 2) If the pt is convinced, as many are, that saba helps recover from activity faster then it's easy to tell if this is the case by re-challenging : ie stop, take the inhaler, then p 5 minutes try the exact same activity (intensity of workload) that just caused the symptoms and see if they are substantially diminished or not after saba 3) if there is an activity that reproducibly causes the symptoms, try the saba 15 min before the activity on alternate days   If in fact the saba really does help, then fine to continue to use it prn but advised may need to look closer at the maintenance regimen being used to achieve better control of airways disease with exertion.

## 2023-01-18 NOTE — Assessment & Plan Note (Signed)
HC03   02/17/22  = 41 01/18/2023   Walked on 4lpm poc   x  3  lap(s) =  approx 450  ft  @ mod pace, stopped due to end of study  with lowest 02 sats 93% and mild sob at end    Rec 4lpm but ok to titrate up if needed  Each maintenance medication was reviewed in detail including emphasizing most importantly the difference between maintenance and prns and under what circumstances the prns are to be triggered using an action plan format where appropriate.  Total time for H and P, chart review, counseling, reviewing smi/hfa/02/ pulse ox  device(s) , directly observing portions of ambulatory 02 saturation study/ and generating customized AVS unique to this office visit / same day charting = 40 min new pt eval

## 2023-01-18 NOTE — Patient Instructions (Addendum)
Make sure you check your oxygen saturation  AT  your highest level of activity (not after you stop)   to be sure it stays over 90% and adjust  02 flow upward to maintain this level if needed but remember to turn it back to previous settings when you stop (to conserve your supply).   Plan A = Automatic = Always=    stiolto 2 puffs each am   Plan B = Backup (to supplement plan A, not to replace it) Only use your combivent inhaler as a rescue medication to be used if you can't catch your breath by resting or doing a relaxed purse lip breathing pattern.  - The less you use it, the better it will work when you need it. - Ok to use the inhaler up to 1 puffs  every 4 hours if you must but call for appointment if use goes up over your usual need - Don't leave home without it !!  (think of it like the spare tire for your car)   Plan C = Crisis (instead of Plan B but only if Plan B stops working) - only use your albuterol nebulizer if you first try Plan B and it fails to help > ok to use the nebulizer up to every 4 hours but if start needing it regularly call for immediate appointment  Also  Ok to try albuterol 15 min before an activity (on alternating days)  that you know would usually make you short of breath and see if it makes any difference and if makes none then don't take albuterol after activity unless you can't catch your breath as this means it's the resting that helps, not the albuterol.      Prednisone 10 mg take  4 each am x 2 days,   2 each am x 2 days,  1 each am x 2 days and stop   My office will be contacting you by phone for referral to lung cancer screening program   - if you don't hear back from my office within one week please call us back or notify us thru MyChart and we'll address it right away.  Please remember to go to the lab department   for your tests - we will call you with the results when they are available.      Please schedule a follow up visit in 3 months but call  sooner if needed  with all medications /inhalers/ solutions in hand so we can verify exactly what you are taking. This includes all medications from all doctors and over the counters

## 2023-01-18 NOTE — Assessment & Plan Note (Signed)
Active smoker - referred back to LCS 01/18/2023 >>>   Low-dose CT lung cancer screening is recommended for patients who are 23-75 years of age with a 20+ pack-year history of smoking and who are currently smoking or quit <=15 years ago. No coughing up blood  No unintentional weight loss of > 15 pounds in the last 6 months - pt is eligible for scanning yearly until age 61   4-5 min discussion re active cigarette smoking in addition to office E&M  Ask about tobacco use:   ongoing Advise quitting   /lhs  Assess willingness:  Not committed at this point Assist in quit attempt:  Per PCP when ready Arrange follow up:   Follow up per Primary Care planned

## 2023-01-18 NOTE — Progress Notes (Signed)
Joanne Kramer, female    DOB: [birth year]    MRN: [id number]   Brief patient profile:  77  yowf  active smoker from [facility]- referred to pulmonary clinic in [place]  01/18/2023    for COPD  GOLD 4 / 02 dep      History of Present Illness  01/18/2023  Pulmonary/ 1st office eval/ Joanne Kramer / Joanne Kramer [facility] breztri  Chief Complaint  Patient presents with   Shortness of Breath   COPD  Dyspnea:  [employer] on 4lpm  Cough: mod rattling smoker's cough > light yellow not worse  Sleep: flat bed one pillow  SABA use: thinks combivent works the best  02: 4lpm  Lung cancer screen: referred today   No obvious day to day or daytime pattern/variability or assoc excess/ purulent sputum or mucus plugs or hemoptysis or cp or chest tightness, subjective wheeze or overt sinus or hb symptoms.    Also denies any obvious fluctuation of symptoms with weather or environmental changes or other aggravating or alleviating factors except as outlined above   No unusual exposure hx or h/o childhood pna/ asthma or knowledge of premature birth.  Current Allergies, Complete Past Medical History, Past Surgical History, Family History, and Social History were reviewed in [employer] record.  ROS  The following are not active complaints unless bolded Hoarseness, sore throat, dysphagia, dental problems, itching, sneezing,  nasal congestion or discharge of excess mucus or purulent secretions, ear ache,   fever, chills, sweats, unintended wt loss or wt gain, classically pleuritic or exertional cp,  orthopnea pnd or arm/hand swelling  or leg swelling, presyncope, palpitations, abdominal pain, anorexia, nausea, vomiting, diarrhea  or change in bowel habits or change in bladder habits, change in stools or change in urine, dysuria, hematuria,  rash, arthralgias, visual complaints, headache, numbness, weakness or ataxia or problems with walking or coordination,  change in mood or   memory.            Outpatient Medications Prior to Visit  Medication Sig Dispense Refill   albuterol (PROVENTIL) (2.5 MG/3ML) 0.083% nebulizer solution Take 3 mLs (2.5 mg total) by nebulization every 6 (six) hours as needed for wheezing or shortness of breath. 360 mL 5   Budeson-Glycopyrrol-Formoterol (BREZTRI AEROSPHERE) 160-9-4.8 MCG/ACT AERO TAKE 2 PUFFS BY MOUTH TWICE A DAY 10.7 each 6   calcium carbonate (TUMS EX) 750 MG chewable tablet Chew 750-1,500 mg by mouth 3 (three) times daily as needed (for heartburn/indigestion.).     Ipratropium-Albuterol (COMBIVENT RESPIMAT) 20-100 MCG/ACT AERS respimat Inhale 1 puff into the lungs every 6 (six) hours as needed for wheezing. 4 g 6   Melatonin 5 MG TABS Take 5 mg by mouth at bedtime as needed (for sleep.).      Multiple Minerals-Vitamins (BONE ESSENTIALS) CAPS Take 1 tablet by mouth daily.     Multiple Vitamin (MULTIVITAMIN WITH MINERALS) TABS tablet Take 1 tablet by mouth daily.     naproxen sodium (ALEVE) 220 MG tablet Take 440 mg by mouth daily as needed (pain).     OXYGEN Inhale 2 L into the lungs at bedtime. Bedtime and as needed     triamcinolone cream (KENALOG) 0.1 % Apply 1 application topically 2 (two) times daily as needed (for skin irritation/contact dermatitis). 453.6 g 1   vitamin B-12 (CYANOCOBALAMIN) 1000 MCG tablet Take 1,000 mcg by mouth daily.     vitamin C (ASCORBIC ACID) 250 MG tablet Take 250 mg by  mouth daily.     No facility-administered medications prior to visit.     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   OA (osteoarthritis) of knee    right   Pneumonia    Requires supplemental oxygen       Objective:     BP 122/81   Pulse 100   Ht 5\' 4"  (1.626 m)    Wt 149 lb (67.6 kg)   SpO2 92% Comment: 4L PULSED  BMI 25.58 kg/m   SpO2: 92 % (4L PULSED)  HEENT :  Oropharynx  clear    NECK :  without JVD/Nodes/TM/ nl carotid upstrokes bilaterally   LUNGS: no acc muscle use,  Mod barrel  contour chest wall with bilateral  Distant exp  wheeze better with PLB  and  without cough on insp or exp maneuvers and mod  Hyperresonant  to  percussion bilaterally     CV:  RRR  no s3 or murmur or increase in P2, and no edema   ABD:  soft and nontender with pos mid insp Hoover's  in the supine position. No bruits or organomegaly appreciated, bowel sounds nl  MS:   Ext warm without deformities or   obvious joint restrictions , calf tenderness, cyanosis or clubbing  SKIN: warm and dry without lesions    NEURO:  alert, approp, nl sensorium with  no motor or cerebellar deficits apparent.              Assessment   COPD  GOLD 4 Active smoker - PFT's  08/20/19  FEV1 0.67 (29 % ) ratio 0.42  p 0 % improvement from saba p breztri prior to study with DLCO  9.10 (45%)   and FV curve classsically concave    - Allergy screen 01/18/2023 >  Eos / alpha one phenotype - 01/18/2023  After extensive coaching inhaler device,  effectiveness =    75% with SMI >  change breztri to stiolto plus approp saba  Pt is Group B in terms of symptom/risk and laba/lama therefore appropriate rx at this point >>>  try stiolto pending Eos and ? Response to pred x 6 days   Re SABA :  I spent extra time with pt today reviewing appropriate use of albuterol for prn use on exertion with the following points: 1) saba is for relief of sob that does not improve by walking a slower pace or resting but rather if the pt does not improve after trying this first. 2) If the pt is convinced, as many are, that saba helps recover from activity faster then it's easy to tell if this is the case by re-challenging : ie stop, take the inhaler, then p 5 minutes try the exact same activity (intensity of  workload) that just caused the symptoms and see if they are substantially diminished or not after saba 3) if there is an activity that reproducibly causes the symptoms, try the saba 15 min before the activity on alternate days   If in fact the saba really does help, then fine to continue to use it prn but advised may need to look closer at the maintenance regimen being used to achieve better control of  airways disease with exertion.      Cigarette smoker Active smoker - referred back to [facility] 01/18/2023 >>>   Low-dose CT lung cancer screening is recommended for patients who are 64-41 years of age with a 20+ pack-year history of smoking and who are currently smoking or quit <=15 years ago. No coughing up blood  No unintentional weight loss of > 15 pounds in the last 6 months - pt is eligible for scanning yearly until age 45   4-5 min discussion re active cigarette smoking in addition to office E&M  Ask about tobacco use:   ongoing Advise quitting   /lhs  Assess willingness:  Not committed at this point Assist in quit attempt:  Per PCP when ready Arrange follow up:   Follow up per Primary Care planned     Chronic respiratory failure with hypoxia and hypercapnia (HCC) HC03   02/17/22  = 39 01/18/2023   Walked on 4lpm poc   x  3  lap(s) =  approx 450  ft  @ mod pace, stopped due to end of study  with lowest 02 sats 93% and mild sob at end    Rec 4lpm but ok to titrate up if needed  Each maintenance medication was reviewed in detail including emphasizing most importantly the difference between maintenance and prns and under what circumstances the prns are to be triggered using an action plan format where appropriate.  Total time for H and P, chart review, counseling, reviewing smi/hfa/02/ pulse ox  device(s) , directly observing portions of ambulatory 02 saturation study/ and generating customized AVS unique to this office visit / same day charting = 40 min new pt eval                    Sandrea Hughs, MD 01/18/2023

## 2023-01-21 LAB — BASIC METABOLIC PANEL
BUN/Creatinine Ratio: 21 (ref 12–28)
BUN: 12 mg/dL (ref 8–27)
CO2: 33 mmol/L — ABNORMAL HIGH (ref 20–29)
Calcium: 9.4 mg/dL (ref 8.7–10.3)
Chloride: 97 mmol/L (ref 96–106)
Creatinine, Ser: 0.56 mg/dL — ABNORMAL LOW (ref 0.57–1.00)
Glucose: 98 mg/dL (ref 70–99)
Potassium: 4.3 mmol/L (ref 3.5–5.2)
Sodium: 145 mmol/L — ABNORMAL HIGH (ref 134–144)
eGFR: 95 mL/min/{1.73_m2} (ref >59)

## 2023-01-21 LAB — CBC WITH DIFFERENTIAL/PLATELET
Basophils Absolute: 0 10*3/uL (ref 0.0–0.2)
Basos: 1 %
EOS (ABSOLUTE): 0.1 10*3/uL (ref 0.0–0.4)
Eos: 3 %
Hematocrit: 36.9 % (ref 34.0–46.6)
Hemoglobin: 11.5 g/dL (ref 11.1–15.9)
Immature Grans (Abs): 0 10*3/uL (ref 0.0–0.1)
Immature Granulocytes: 0 %
Lymphocytes Absolute: 1.6 10*3/uL (ref 0.7–3.1)
Lymphs: 38 %
MCH: 30.1 pg (ref 26.6–33.0)
MCHC: 31.2 g/dL — ABNORMAL LOW (ref 31.5–35.7)
MCV: 97 fL (ref 79–97)
Monocytes Absolute: 0.5 10*3/uL (ref 0.1–0.9)
Monocytes: 11 %
Neutrophils Absolute: 2 10*3/uL (ref 1.4–7.0)
Neutrophils: 47 %
Platelets: 176 10*3/uL (ref 150–450)
RBC: 3.82 x10E6/uL (ref 3.77–5.28)
RDW: 13.2 % (ref 11.7–15.4)
WBC: 4.2 10*3/uL (ref 3.4–10.8)

## 2023-01-21 LAB — ALPHA-1-ANTITRYPSIN PHENOTYP: A-1 Antitrypsin: 190 mg/dL — ABNORMAL HIGH (ref 101–187)

## 2023-02-06 ENCOUNTER — Other Ambulatory Visit: Payer: Self-pay

## 2023-02-06 DIAGNOSIS — Z122 Encounter for screening for malignant neoplasm of respiratory organs: Secondary | ICD-10-CM

## 2023-02-06 DIAGNOSIS — Z87891 Personal history of nicotine dependence: Secondary | ICD-10-CM

## 2023-02-06 DIAGNOSIS — F17210 Nicotine dependence, cigarettes, uncomplicated: Secondary | ICD-10-CM

## 2023-03-17 ENCOUNTER — Ambulatory Visit (HOSPITAL_COMMUNITY)
Admission: RE | Admit: 2023-03-17 | Discharge: 2023-03-17 | Disposition: A | Discharge status: Home / Self Care | Payer: Medicare Other | Source: Ambulatory Visit | Attending: Family Medicine | Admitting: Family Medicine

## 2023-03-17 DIAGNOSIS — Z87891 Personal history of nicotine dependence: Secondary | ICD-10-CM

## 2023-03-17 DIAGNOSIS — F17210 Nicotine dependence, cigarettes, uncomplicated: Secondary | ICD-10-CM | POA: Diagnosis not present

## 2023-03-17 DIAGNOSIS — Z122 Encounter for screening for malignant neoplasm of respiratory organs: Secondary | ICD-10-CM | POA: Diagnosis not present

## 2023-03-22 ENCOUNTER — Telehealth: Payer: Self-pay | Admitting: *Deleted

## 2023-03-22 NOTE — Telephone Encounter (Signed)
Call report is in.

## 2023-03-22 NOTE — Telephone Encounter (Signed)
Call Report  IMPRESSION: 1. New 16.6 mm posterior segment right upper lobe nodule. Lung-RADS 4B, suspicious. Additional imaging evaluation or consultation with Pulmonology or Thoracic Surgery recommended. These results will be called to the ordering clinician or representative by the Radiologist Assistant, and communication documented in the PACS or [employer]. 2. 4.1 cm ascending aortic aneurysm, stable. Recommend annual imaging followup by CTA or MRA. This recommendation follows 2010 ACCF/AHA/AATS/ACR/ASA/SCA/SCAI/SIR/STS/SVM Guidelines for the Diagnosis and Management of Patients with Thoracic Aortic Disease. Circulation. 2010; 121: Q657-Q469. Aortic aneurysm NOS (ICD10-I71.9). 3. Aortic atherosclerosis (ICD10-I70.0). Coronary artery calcification. 4. Enlarged pulmonic trunk, indicative of pulmonary arterial hypertension. 5.  Emphysema (ICD10-J43.9).

## 2023-03-24 ENCOUNTER — Telehealth: Payer: Self-pay | Admitting: Acute Care

## 2023-03-24 DIAGNOSIS — R911 Solitary pulmonary nodule: Secondary | ICD-10-CM

## 2023-03-24 NOTE — Telephone Encounter (Signed)
I have called the patient with the results of her low dose Ct Chest. Her scan was read as a LR 4B. There is a new 16.6 mm posterior segment right upper lobe nodule with GG along its margin. She states she has not bee sick, no purulent secretions . We discussed that we need to evaluate this more closely. She is in agreement with a PET scan and then follow up with the [facility] office, or Dr. Sherene Sires in [place]. Dr. Sherene Sires, would you be ok seeing her for follow up PET  in the [facility] office so she does not have to drive to [facility]. She said she is a bad driver. You would just need to let us know if we need to get her scheduled for a bronch vs short term follow up ( which we would order)  so our follow up is seamless. Would you be able to do that for [place]?  Thanks so much.  Ladies, fax results to PCP and let them know results of scan and that PET follow up is planned with pulmonary visit after to review results and determine plan of care moving forward.  Thanks all.

## 2023-03-27 NOTE — Telephone Encounter (Signed)
Results/ plans faxed to PCP. Will place on reminder list to follow  PET and pulmonary f/u.

## 2023-03-27 NOTE — Telephone Encounter (Signed)
See telephone note from 03/24/23.

## 2023-04-04 ENCOUNTER — Ambulatory Visit (HOSPITAL_COMMUNITY)
Admission: RE | Admit: 2023-04-04 | Discharge: 2023-04-04 | Disposition: A | Discharge status: Home / Self Care | Payer: Medicare Other | Source: Ambulatory Visit | Attending: Acute Care | Admitting: Acute Care

## 2023-04-04 DIAGNOSIS — R911 Solitary pulmonary nodule: Secondary | ICD-10-CM | POA: Insufficient documentation

## 2023-04-04 DIAGNOSIS — Z853 Personal history of malignant neoplasm of breast: Secondary | ICD-10-CM | POA: Insufficient documentation

## 2023-04-04 DIAGNOSIS — F17210 Nicotine dependence, cigarettes, uncomplicated: Secondary | ICD-10-CM | POA: Diagnosis not present

## 2023-04-04 DIAGNOSIS — C3431 Malignant neoplasm of lower lobe, right bronchus or lung: Secondary | ICD-10-CM | POA: Diagnosis not present

## 2023-04-04 LAB — GLUCOSE, CAPILLARY: Glucose-Capillary: 80 mg/dL (ref 70–99)

## 2023-04-04 MED ORDER — FLUDEOXYGLUCOSE F - 18 (FDG) INJECTION: 7.4000 | Freq: Once | INTRAVENOUS | Status: DC | PRN

## 2023-04-22 NOTE — Progress Notes (Unsigned)
 Joanne Kramer, female    DOB: 1945/12/09    MRN: [id number]   Brief patient profile:  73  yowf  active smoker from [facility]- referred to pulmonary clinic in [place]  01/18/2023    for COPD  GOLD 4 / 02 dep      History of Present Illness  01/18/2023  Pulmonary/ 1st office eval/ Torrence Hammack / [facility] breztri  Chief Complaint  Patient presents with   Shortness of Breath   COPD  Dyspnea:  [employer] on 4lpm  Cough: mod rattling smoker's cough > light yellow not worse  Sleep: flat bed one pillow  SABA use: thinks combivent works the best  02: 4lpm  Lung cancer screen: referred today  Rec     04/24/2023  f/u ov/[place] office/Bayley Hurn re: *** maint on ***  No chief complaint on file.   Dyspnea:  *** Cough: *** Sleeping: ***   resp cc  SABA use: *** 02: ***  Lung cancer screening: ***   No obvious day to day or daytime variability or assoc excess/ purulent sputum or mucus plugs or hemoptysis or cp or chest tightness, subjective wheeze or overt sinus or hb symptoms.    Also denies any obvious fluctuation of symptoms with weather or environmental changes or other aggravating or alleviating factors except as outlined above   No unusual exposure hx or h/o childhood pna/ asthma or knowledge of premature birth.  Current Allergies, Complete Past Medical History, Past Surgical History, Family History, and Social History were reviewed in [employer] record.  ROS  The following are not active complaints unless bolded Hoarseness, sore throat, dysphagia, dental problems, itching, sneezing,  nasal congestion or discharge of excess mucus or purulent secretions, ear ache,   fever, chills, sweats, unintended wt loss or wt gain, classically pleuritic or exertional cp,  orthopnea pnd or arm/hand swelling  or leg swelling, presyncope, palpitations, abdominal pain, anorexia, nausea, vomiting, diarrhea  or change in bowel habits or change in  bladder habits, change in stools or change in urine, dysuria, hematuria,  rash, arthralgias, visual complaints, headache, numbness, weakness or ataxia or problems with walking or coordination,  change in mood or  memory.        No outpatient medications have been marked as taking for the 04/24/23 encounter (Appointment) with Nyoka Cowden, MD.          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

## 2023-04-24 ENCOUNTER — Encounter: Payer: Self-pay | Admitting: Internal Medicine

## 2023-04-24 ENCOUNTER — Ambulatory Visit: Payer: Medicare Other | Admitting: Internal Medicine

## 2023-04-24 VITALS — BP 123/78 | HR 100 | Wt 151.0 lb

## 2023-04-24 DIAGNOSIS — F17210 Nicotine dependence, cigarettes, uncomplicated: Secondary | ICD-10-CM | POA: Diagnosis not present

## 2023-04-24 DIAGNOSIS — J9612 Chronic respiratory failure with hypercapnia: Secondary | ICD-10-CM | POA: Diagnosis not present

## 2023-04-24 DIAGNOSIS — R911 Solitary pulmonary nodule: Secondary | ICD-10-CM | POA: Insufficient documentation

## 2023-04-24 DIAGNOSIS — J9611 Chronic respiratory failure with hypoxia: Secondary | ICD-10-CM

## 2023-04-24 DIAGNOSIS — J449 Chronic obstructive pulmonary disease, unspecified: Secondary | ICD-10-CM | POA: Diagnosis not present

## 2023-04-24 NOTE — Assessment & Plan Note (Signed)
 Active smoker/MM - PFT's  08/20/19  FEV1 0.67 (29 % ) ratio 0.42  p 0 % improvement from saba p breztri prior to study with DLCO  9.10 (45%)   and FV curve classsically concave    - Allergy screen 01/18/2023 >  Eos 0.1  / alpha one phenotype MM  level 190  - 01/18/2023  After extensive coaching inhaler device,  effectiveness =    75% with SMI >  change breztri to stiolto plus approp saba  Pt is Group B in terms of symptom/risk and laba/lama therefore appropriate rx at this point >>>  stiolto approp

## 2023-04-24 NOTE — Patient Instructions (Signed)
 My office will be contacting you by phone for referral to [facility] for Super D CT and scheduling a lung biopsy to follow by about  2 weeks  - if you don't hear back from my office within one week please call us back or notify us thru MyChart and we'll address it right away.   The key is to stop smoking completely before smoking completely stops you!    Please schedule a follow up visit in 3 months but call sooner if needed

## 2023-04-24 NOTE — Assessment & Plan Note (Addendum)
 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

## 2023-04-24 NOTE — Assessment & Plan Note (Signed)
 Counseled re importance of smoking cessation but did not meet time criteria for separate billing     F/u 3 m - sooner prn          Each maintenance medication was reviewed in detail including emphasizing most importantly the difference between maintenance and prns and under what circumstances the prns are to be triggered using an action plan format where appropriate.  Total time for H and P, chart review, counseling, reviewing SMI/ 02/pulse ox  device(s) and generating customized AVS unique to this office visit / same day charting = 32 min

## 2023-04-24 NOTE — Assessment & Plan Note (Addendum)
 HC03   02/17/22  = 39 01/18/2023   Walked on 4lpm poc   x  3  lap(s) =  approx 450  ft  @ mod pace, stopped due to end of study  with lowest 02 sats 93% and mild sob at end    Adequate control on present rx, reviewed in detail with pt > no change in rx needed      Rec Make sure you check your oxygen saturation  AT  your highest level of activity (not after you stop)   to be sure it stays over 90% and adjust  02 flow upward to maintain this level if needed but remember to turn it back to previous settings when you stop (to conserve your supply).

## 2023-05-07 ENCOUNTER — Ambulatory Visit (HOSPITAL_COMMUNITY)
Admission: RE | Admit: 2023-05-07 | Discharge: 2023-05-07 | Disposition: A | Discharge status: Home / Self Care | Payer: Medicare Other | Source: Ambulatory Visit | Attending: Internal Medicine | Admitting: Internal Medicine

## 2023-05-07 DIAGNOSIS — R911 Solitary pulmonary nodule: Secondary | ICD-10-CM | POA: Insufficient documentation

## 2023-05-07 DIAGNOSIS — J432 Centrilobular emphysema: Secondary | ICD-10-CM | POA: Diagnosis not present

## 2023-05-07 DIAGNOSIS — I7121 Aneurysm of the ascending aorta, without rupture: Secondary | ICD-10-CM | POA: Diagnosis not present

## 2023-05-07 DIAGNOSIS — R918 Other nonspecific abnormal finding of lung field: Secondary | ICD-10-CM | POA: Diagnosis not present

## 2023-05-30 ENCOUNTER — Ambulatory Visit (INDEPENDENT_AMBULATORY_CARE_PROVIDER_SITE_OTHER): Admitting: Family Medicine

## 2023-05-30 ENCOUNTER — Encounter: Payer: Self-pay | Admitting: Family Medicine

## 2023-05-30 VITALS — BP 125/72 | HR 89 | Temp 97.1°F | Ht 64.0 in | Wt 143.0 lb

## 2023-05-30 DIAGNOSIS — R911 Solitary pulmonary nodule: Secondary | ICD-10-CM | POA: Diagnosis not present

## 2023-05-30 DIAGNOSIS — J449 Chronic obstructive pulmonary disease, unspecified: Secondary | ICD-10-CM | POA: Diagnosis not present

## 2023-05-30 DIAGNOSIS — R0789 Other chest pain: Secondary | ICD-10-CM

## 2023-05-30 MED ORDER — PREDNISONE 20 MG PO TABS: ORAL_TABLET | ORAL | 0 refills | Status: DC

## 2023-05-30 NOTE — Progress Notes (Signed)
 Subjective:  Patient ID: Joanne Kramer, female    DOB: Jul 26, 1945  Age: 78 y.o. MRN: [id number]  CC: Pain (Left side pain for months now. Always there but sometimes worse than others. Under breast, under ribs and around left side of back. No triggers. No appetite. ), Peripheral Neuropathy (Neuropathy getting worse and having bad ankle swelling.), and Breathing Problem (Breathing getting worse. No wheezing. )   HPI Joanne Kramer presents for approximately 6 months of pain radiating from the thoracic back on the left at approximately the angle of the scapula radiating up along the ninth rib all the way around to the breastbone.  Patient states the pain is moderate to severe.  It is intermittent but there are no known triggers.  She has no known injury.  Patient relates also that she has been having breathing problems getting worse although she is not wheezing she is not short of breath.  She has COPD and wears portable oxygen with nasal cannula at time of evaluation.     09/28/2021    9:38 AM 01/06/2020   12:24 PM 07/04/2019    9:14 AM  Depression screen PHQ 2/9  Decreased Interest 1 0 0  Down, Depressed, Hopeless 0 0 0  PHQ - 2 Score 1 0 0  Altered sleeping 3    Tired, decreased energy 1    Change in appetite 0    Feeling bad or failure about yourself  0    Trouble concentrating 0    Moving slowly or fidgety/restless 0    Suicidal thoughts 0    PHQ-9 Score 5    Difficult doing work/chores Somewhat difficult      History Joanne Kramer has a past medical history of Breast cancer (HCC), Contact dermatitis, COPD (chronic obstructive pulmonary disease) (HCC), Emphysema of lung (HCC), Headache, History of total right knee replacement (07/12/2017), OA (osteoarthritis) of knee, Pneumonia, and Requires supplemental oxygen.   She has a past surgical history that includes Cesarean section; Knee surgery (Right); Tonsillectomy; Breast surgery; Colonoscopy; Total knee arthroplasty (Right, 06/27/2017);  Breast lumpectomy with radioactive seed and sentinel lymph node biopsy (Left, 03/05/2018); Eye surgery (Bilateral); Esophagogastroduodenoscopy (egd) with propofol (N/A, 03/03/2020); Balloon dilation (N/A, 03/03/2020); biopsy (03/03/2020); and Esophageal brushing (03/03/2020).   Her family history is not on file. She was adopted.She reports that she has been smoking cigarettes. She has a 27.5 pack-year smoking history. She has never used smokeless tobacco. She reports that she does not drink alcohol and does not use drugs.    ROS Review of Systems  Constitutional: Negative.   HENT:  Negative for congestion.   Eyes:  Negative for visual disturbance.  Respiratory:  Positive for shortness of breath.   Cardiovascular:  Positive for chest pain (This is lateral and inferior and not precordial).  Gastrointestinal:  Negative for abdominal pain, constipation, diarrhea, nausea and vomiting.  Genitourinary:  Negative for difficulty urinating.  Musculoskeletal:  Positive for arthralgias and myalgias.  Neurological:  Negative for headaches.  Psychiatric/Behavioral:  Negative for sleep disturbance.     Objective:  BP 125/72   Pulse 89   Temp (!) 97.1 F (36.2 C)   Ht 5\' 4"  (1.626 m)   Wt 143 lb (64.9 kg)   SpO2 93%   BMI 24.55 kg/m   BP Readings from Last 3 Encounters:  05/30/23 125/72  04/24/23 123/78  01/18/23 122/81    Wt Readings from Last 3 Encounters:  05/30/23 143 lb (64.9 kg)  04/24/23 151 lb (68.5 kg)  01/18/23 149 lb (67.6 kg)     Physical Exam Constitutional:      General: She is not in acute distress.    Appearance: She is well-developed.  Cardiovascular:     Rate and Rhythm: Normal rate and regular rhythm.  Pulmonary:     Breath sounds: Wheezing present. No rhonchi or rales.     Comments: Breath sounds are diminished throughout. Chest:     Chest wall: Tenderness present.  Musculoskeletal:        General: Normal range of motion.  Skin:    General: Skin is warm and dry.   Neurological:     Mental Status: She is alert and oriented to person, place, and time.    Of note is the patient has a solitary pulmonary nodule noted on CT and not definite on PET scan so a super D CT scan of the lung was ordered.  This was performed 3 weeks ago.  Patient has been waiting for the results and asks that they be reviewed with her today.  Unfortunately on review of the chart it is evident that the CT scan has not been read as yet, 3 weeks later.  When reviewing the injury under imaging for CT super D chest without contrast the status states exam and did.  Patient says this makes her very nervous since she has had to wait an extended period of time and the outcome could be cancer.  Assessment & Plan:  Chest wall pain  COPD  GOLD 4  Solitary pulmonary nodule on lung CT  Other orders -     predniSONE; One twice daily with food for 2 weeks. Then one daily for 2 weeks  Dispense: 42 tablet; Refill: 0     Follow-up: Return in about 1 month (around 06/29/2023).  Mechele Claude, M.D.

## 2023-06-01 ENCOUNTER — Encounter: Payer: Self-pay | Admitting: Internal Medicine

## 2023-06-01 ENCOUNTER — Telehealth: Payer: Self-pay | Admitting: Internal Medicine

## 2023-06-01 NOTE — Telephone Encounter (Signed)
 Copied from CRM [id number]. Topic: Clinical - Lab/Test Results >> Jun 01, 2023  4:45 PM Renie Ora wrote: Reason for CRM: Patient calling in regarding her CT Chest x-ray results, patient stated she is awaiting results and was expecting a phone call on today. Patient stated she is not happy waiting to know if she has cancer or not and she needs to be notified.

## 2023-06-02 NOTE — Telephone Encounter (Signed)
 Error

## 2023-06-06 NOTE — Telephone Encounter (Signed)
 Attempted to reach patient and schedule f/u appt. LVM to call office and schedule appt.

## 2023-06-07 NOTE — Telephone Encounter (Signed)
 Thank you :)

## 2023-06-07 NOTE — Telephone Encounter (Signed)
 Called and spoke to pt. Patient is scheduled to see Dr. Delton Coombes on 06/08/23 at 4pm. Pt verbalized understanding and denied any further questions or concerns at this time.

## 2023-06-08 ENCOUNTER — Encounter: Payer: Self-pay | Admitting: Emergency Medicine

## 2023-06-08 ENCOUNTER — Ambulatory Visit: Admitting: Emergency Medicine

## 2023-06-08 VITALS — BP 135/81 | HR 109 | Ht 65.0 in | Wt 146.8 lb

## 2023-06-08 DIAGNOSIS — R911 Solitary pulmonary nodule: Secondary | ICD-10-CM

## 2023-06-08 NOTE — Progress Notes (Signed)
 Subjective:    Patient ID: Joanne Kramer, female    DOB: [birth year], 78 y.o.   MRN: [id number]  HPI 78 year old active smoker with history of severe COPD and chronic hypoxemic respiratory failure followed by Dr. Waymond Hailey in our office.  Also history of breast cancer, treated 2022.  She participates in the lung cancer screening program and was found to have a new right upper lobe spiculated nodule on her scan from 03/2023.  This prompted PET scan as below. She is on on oxygen at 4 L/min.  Managed on Stiolto, Combivent as needed, albuterol nebs as needed.   Lung cancer screening CT scan of the chest 03/17/2023 reviewed by me shows 16.5 mm posterior segment right upper lobe pulmonary nodule new compared with 04/2021.  PET scan 2//25 reviewed by me shows the 50 mm right upper lobe nodule is hypermetabolic.  There is no evidence of any hypermetabolic adenopathy or distant disease.  Super D CT scan of the chest 05/07/2023 reviewed by me, shows centrilobular paraseptal emphysema, 1.6 x 1.5 cm posterior right upper lobe pulmonary nodule at the major fissure, 2.2 cm and 7 mm groundglass opacities in the right lower lobe that are unchanged going back to 08/2019.  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   OA (osteoarthritis) of knee    right   Pneumonia    Requires supplemental oxygen      Family History  Adopted: Yes     Social History   Socioeconomic History   Marital status: Media planner    Spouse name: Not on file   Number of children: 2   Years of education: 16   Highest education level: Bachelor's degree (e.g., BA, AB, BS)  Occupational History   Occupation:  Retired    Comment: Energy manager  Tobacco Use   Smoking status: Every Day    Current packs/day: 0.50    Average packs/day: 0.5 packs/day for 55.0 years (27.5 ttl pk-yrs)    Types: Cigarettes   Smokeless tobacco: Never   Tobacco comments:    smokes 10 cigarettes per day 06/08/23  Vaping Use   Vaping status: Never Used  Substance and Sexual Activity   Alcohol use: No   Drug use: No   Sexual activity: Not Currently  Other Topics Concern   Not on file  Social History Narrative   Not on file   Social Drivers of Health   Financial Resource Strain: Low Risk  (04/12/2018)   Received from [facility], [facility]   Overall Financial Resource Strain (CARDIA)    Difficulty of Paying Living Expenses: Not hard at all  Food Insecurity: Unknown (04/12/2018)   Received from [facility], [facility]   Hunger Vital Sign    Worried About Running Out of Food in the Last Year: Patient declined    Ran Out of Food in the Last Year: Patient declined  Transportation Needs: No Transportation Needs (04/12/2018)   Received from [facility], [facility]   PRAPARE - Transportation    Lack of Transportation (Medical): No    Lack of Transportation (Non-Medical):  No  Physical Activity: Inactive (12/27/2018)   Exercise Vital Sign    Days of Exercise per Week: 0 days    Minutes of Exercise per Session: 0 min  Stress: No Stress Concern Present (04/12/2018)   Received from [facility], [facility] - Occupational Stress Questionnaire    Feeling of Stress : Only a little  Social Connections: Unknown (04/12/2018)   Received from [facility], [facility]   Social Connection and Isolation Panel [NHANES]    Frequency of Communication with Friends and Family: Patient declined    Frequency of Social Gatherings with Friends and Family: Patient declined    Attends Religious Services: Patient declined    Database administrator or  Organizations: Patient declined    Attends Banker Meetings: Patient declined    Marital Status: Patient declined  Intimate Partner Violence: Unknown (04/12/2018)   Received from [facility], [facility]   Humiliation, Afraid, Rape, and Kick questionnaire    Fear of Current or Ex-Partner: Patient declined    Emotionally Abused: Patient declined    Physically Abused: Patient declined    Sexually Abused: Patient declined     Allergies  Allergen Reactions   Prolia [Denosumab] Other (See Comments)    Joint pain   Anastrozole     Joint pain   Letrozole     Joint pain   Tamoxifen     Joint pain     Outpatient Medications Prior to Visit  Medication Sig Dispense Refill   albuterol (PROVENTIL) (2.5 MG/3ML) 0.083% nebulizer solution Take 3 mLs (2.5 mg total) by nebulization every 6 (six) hours as needed for wheezing or shortness of breath. 360 mL 5   calcium carbonate (TUMS EX) 750 MG chewable tablet Chew 750-1,500 mg by mouth 3 (three) times daily as needed (for heartburn/indigestion.).     Ipratropium-Albuterol (COMBIVENT RESPIMAT) 20-100 MCG/ACT AERS respimat Inhale 1 puff into the lungs every 6 (six) hours as needed for wheezing. 4 g 6   Melatonin 5 MG TABS Take 5 mg by mouth at bedtime as needed (for sleep.).      Multiple Minerals-Vitamins (BONE ESSENTIALS) CAPS Take 1 tablet by mouth daily.     Multiple Vitamin (MULTIVITAMIN WITH MINERALS) TABS tablet Take 1 tablet by mouth daily.     naproxen sodium (ALEVE) 220 MG tablet Take 440 mg by mouth daily as needed (pain).     OXYGEN Inhale 2 L into the lungs at bedtime. Bedtime and as needed     predniSONE (DELTASONE) 20 MG tablet One twice daily with food for 2 weeks. Then one daily for 2 weeks 42 tablet 0   Tiotropium Bromide-Olodaterol (STIOLTO RESPIMAT) 2.5-2.5 MCG/ACT AERS Inhale 2 puffs into the lungs daily. 1 each 11   triamcinolone cream (KENALOG) 0.1 % Apply 1 application topically 2 (two) times daily as needed  (for skin irritation/contact dermatitis). 453.6 g 1   vitamin B-12 (CYANOCOBALAMIN) 1000 MCG tablet Take 1,000 mcg by mouth daily.     vitamin C (ASCORBIC ACID) 250 MG tablet Take 250 mg by mouth daily.     No facility-administered medications prior to visit.        Objective:   Physical Exam Vitals:   06/08/23 1556  BP: 135/81  Pulse: (!) 109  SpO2: 92%  Weight: 146 lb 12.8 oz (66.6 kg)  Height: 5\' 5"  (1.651 m)   Gen: Pleasant, chronically ill-appearing woman, in no  distress,  normal affect  ENT: No lesions,  mouth clear,  oropharynx clear, no postnasal drip  Neck: No JVD, no stridor  Lungs: No use of accessory muscles, very distant, decreased to both bases, wheeze on forced expiration  Cardiovascular: RRR, heart sounds normal, no murmur or gallops, trace pretibial peripheral edema  Musculoskeletal: No deformities, no cyanosis or clubbing  Neuro: alert, awake, non focal  Skin: Warm, no lesions or rash       Assessment & Plan:  Solitary pulmonary nodule on lung CT Most concerning nodule is new in the right upper lobe (not present 04/2021).  Concerning for primary lung cancer.  She has 2 groundglass right lower lobe nodules, little to no change since 08/2019.  Need to be followed for 5 years total but less concerning.  I did talk to her today about the pros and cons of bronchoscopy, risks and benefits.  I am concerned that she is high risk for general anesthesia.  I think we should consider referral to radiation oncology for possible SBRT given the fact that the right upper lobe nodule was PET positive.  She agrees with this plan   We reviewed your CT scan of the chest and your PET scan today: - There is a new right upper lobe pulmonary nodule (not present 04/2021) that is active on PET scan and is suspicious for lung cancer.  This area merits investigation/treatment - There are 2 hazy pulmonary nodules in the right lower lobe that have been present and are unchanged going  back to 08/2019.  Their significance is unclear but they have not changed and are much less likely to represent lung cancer.  They will need to be followed  We talked about the pros and cons of bronchoscopy for biopsy.  I think it would make more sense to refer you to radiation oncology to consider empiric stereotactic radiation treatment of your right upper lobe pulmonary nodule given our suspicion for lung cancer.  This would save you the risk of general anesthesia, bronchoscopy.  Dr. Baldwin Levee will refer you to radiation oncology in [place].  Time spent 48 minutes  Racheal Buddle, MD, PhD 06/12/2023, 9:47 AM [facility] [phone] or if no answer before 7:00PM call [phone] For any issues after 7:00PM please call eLink [phone]

## 2023-06-08 NOTE — Patient Instructions (Signed)
 We reviewed your CT scan of the chest and your PET scan today: - There is a new right upper lobe pulmonary nodule (not present 04/2021) that is active on PET scan and is suspicious for lung cancer.  This area merits investigation/treatment - There are 2 hazy pulmonary nodules in the right lower lobe that have been present and are unchanged going back to 08/2019.  Their significance is unclear but they have not changed and are much less likely to represent lung cancer.  They will need to be followed  We talked about the pros and cons of bronchoscopy for biopsy.  I think it would make more sense to refer you to radiation oncology to consider empiric stereotactic radiation treatment of your right upper lobe pulmonary nodule given our suspicion for lung cancer.  This would save you the risk of general anesthesia, bronchoscopy.  Dr. Delton Coombes will refer you to radiation oncology in [place].

## 2023-06-09 ENCOUNTER — Telehealth: Payer: Self-pay | Admitting: Radiation Oncology

## 2023-06-09 NOTE — Telephone Encounter (Signed)
Called patient to schedule a consultation w. Dr. Squire. No answer, LVM for a return call.  

## 2023-06-12 NOTE — Assessment & Plan Note (Signed)
 Most concerning nodule is new in the right upper lobe (not present 04/2021).  Concerning for primary lung cancer.  She has 2 groundglass right lower lobe nodules, little to no change since 08/2019.  Need to be followed for 5 years total but less concerning.  I did talk to her today about the pros and cons of bronchoscopy, risks and benefits.  I am concerned that she is high risk for general anesthesia.  I think we should consider referral to radiation oncology for possible SBRT given the fact that the right upper lobe nodule was PET positive.  She agrees with this plan   We reviewed your CT scan of the chest and your PET scan today: - There is a new right upper lobe pulmonary nodule (not present 04/2021) that is active on PET scan and is suspicious for lung cancer.  This area merits investigation/treatment - There are 2 hazy pulmonary nodules in the right lower lobe that have been present and are unchanged going back to 08/2019.  Their significance is unclear but they have not changed and are much less likely to represent lung cancer.  They will need to be followed  We talked about the pros and cons of bronchoscopy for biopsy.  I think it would make more sense to refer you to radiation oncology to consider empiric stereotactic radiation treatment of your right upper lobe pulmonary nodule given our suspicion for lung cancer.  This would save you the risk of general anesthesia, bronchoscopy.  Dr. Baldwin Levee will refer you to radiation oncology in [place].

## 2023-06-13 ENCOUNTER — Telehealth: Payer: Self-pay | Admitting: Radiation Oncology

## 2023-06-13 NOTE — Telephone Encounter (Signed)
 Patient called to cancel consultation w. Dr. Lurena Sally, prefers to be seen in [place]. All appointments cancelled, closing referral at this time.

## 2023-06-14 ENCOUNTER — Ambulatory Visit

## 2023-06-14 ENCOUNTER — Ambulatory Visit: Admitting: Radiation Oncology

## 2023-06-14 DIAGNOSIS — I2510 Atherosclerotic heart disease of native coronary artery without angina pectoris: Secondary | ICD-10-CM | POA: Diagnosis not present

## 2023-06-14 DIAGNOSIS — Z9981 Dependence on supplemental oxygen: Secondary | ICD-10-CM | POA: Diagnosis not present

## 2023-06-14 DIAGNOSIS — C50412 Malignant neoplasm of upper-outer quadrant of left female breast: Secondary | ICD-10-CM | POA: Diagnosis not present

## 2023-06-14 DIAGNOSIS — C3411 Malignant neoplasm of upper lobe, right bronchus or lung: Secondary | ICD-10-CM | POA: Diagnosis not present

## 2023-06-14 DIAGNOSIS — J449 Chronic obstructive pulmonary disease, unspecified: Secondary | ICD-10-CM | POA: Diagnosis not present

## 2023-06-14 DIAGNOSIS — Z853 Personal history of malignant neoplasm of breast: Secondary | ICD-10-CM | POA: Diagnosis not present

## 2023-06-14 DIAGNOSIS — R918 Other nonspecific abnormal finding of lung field: Secondary | ICD-10-CM | POA: Diagnosis not present

## 2023-06-14 DIAGNOSIS — Z170 Estrogen receptor positive status [ER+]: Secondary | ICD-10-CM | POA: Diagnosis not present

## 2023-06-14 DIAGNOSIS — Z9010 Acquired absence of unspecified breast and nipple: Secondary | ICD-10-CM | POA: Diagnosis not present

## 2023-06-14 DIAGNOSIS — F17210 Nicotine dependence, cigarettes, uncomplicated: Secondary | ICD-10-CM | POA: Diagnosis not present

## 2023-06-26 ENCOUNTER — Other Ambulatory Visit: Payer: Self-pay | Admitting: Family Medicine

## 2023-06-28 DIAGNOSIS — J449 Chronic obstructive pulmonary disease, unspecified: Secondary | ICD-10-CM | POA: Diagnosis not present

## 2023-06-28 DIAGNOSIS — C50412 Malignant neoplasm of upper-outer quadrant of left female breast: Secondary | ICD-10-CM | POA: Diagnosis not present

## 2023-06-28 DIAGNOSIS — Z9981 Dependence on supplemental oxygen: Secondary | ICD-10-CM | POA: Diagnosis not present

## 2023-06-28 DIAGNOSIS — I2510 Atherosclerotic heart disease of native coronary artery without angina pectoris: Secondary | ICD-10-CM | POA: Diagnosis not present

## 2023-06-28 DIAGNOSIS — Z9010 Acquired absence of unspecified breast and nipple: Secondary | ICD-10-CM | POA: Diagnosis not present

## 2023-06-28 DIAGNOSIS — C3411 Malignant neoplasm of upper lobe, right bronchus or lung: Secondary | ICD-10-CM | POA: Diagnosis not present

## 2023-06-28 DIAGNOSIS — Z170 Estrogen receptor positive status [ER+]: Secondary | ICD-10-CM | POA: Diagnosis not present

## 2023-06-28 DIAGNOSIS — F17210 Nicotine dependence, cigarettes, uncomplicated: Secondary | ICD-10-CM | POA: Diagnosis not present

## 2023-06-29 DIAGNOSIS — C3411 Malignant neoplasm of upper lobe, right bronchus or lung: Secondary | ICD-10-CM | POA: Insufficient documentation

## 2023-07-03 ENCOUNTER — Ambulatory Visit (INDEPENDENT_AMBULATORY_CARE_PROVIDER_SITE_OTHER): Admitting: Family Medicine

## 2023-07-03 ENCOUNTER — Encounter: Payer: Self-pay | Admitting: Family Medicine

## 2023-07-03 VITALS — BP 124/79 | HR 88 | Temp 97.9°F | Ht 65.0 in | Wt 146.0 lb

## 2023-07-03 DIAGNOSIS — G629 Polyneuropathy, unspecified: Secondary | ICD-10-CM

## 2023-07-03 DIAGNOSIS — J449 Chronic obstructive pulmonary disease, unspecified: Secondary | ICD-10-CM | POA: Diagnosis not present

## 2023-07-03 DIAGNOSIS — M1712 Unilateral primary osteoarthritis, left knee: Secondary | ICD-10-CM

## 2023-07-03 MED ORDER — TRIAMTERENE-HCTZ 37.5-25 MG PO TABS: 1.0000 | ORAL_TABLET | Freq: Every day | ORAL | 3 refills | Status: DC

## 2023-07-03 MED ORDER — MAGNESIUM 200 MG PO CHEW: 200.0000 mg | CHEWABLE_TABLET | Freq: Two times a day (BID) | ORAL | Status: DC

## 2023-07-03 MED ORDER — PREGABALIN 50 MG PO CAPS: ORAL_CAPSULE | ORAL | 1 refills | Status: DC

## 2023-07-03 NOTE — Progress Notes (Signed)
 Subjective:  Patient ID: Joanne Kramer, female    DOB: 06-Oct-1945  Age: 78 y.o. MRN: [id number]  CC: Medical Management of Chronic Issues (Pt states doing better./Still having ankle swelling and neuropathy.)   HPI Joanne Kramer presents for check up Left lung nodule. Noted on scan. COPD stable on inhalers. .wdsbp     07/03/2023    2:04 PM 09/28/2021    9:38 AM 01/06/2020   12:24 PM  Depression screen PHQ 2/9  Decreased Interest 1 1 0  Down, Depressed, Hopeless 1 0 0  PHQ - 2 Score 2 1 0  Altered sleeping 2 3   Tired, decreased energy 1 1   Change in appetite 1 0   Feeling bad or failure about yourself  0 0   Trouble concentrating 0 0   Moving slowly or fidgety/restless 0 0   Suicidal thoughts 0 0   PHQ-9 Score 6 5   Difficult doing work/chores Somewhat difficult Somewhat difficult     History Joanne Kramer has a past medical history of Breast cancer (HCC), Contact dermatitis, COPD (chronic obstructive pulmonary disease) (HCC), Emphysema of lung (HCC), Headache, History of total right knee replacement (07/12/2017), OA (osteoarthritis) of knee, Pneumonia, and Requires supplemental oxygen .   She has a past surgical history that includes Cesarean section; Knee surgery (Right); Tonsillectomy; Breast surgery; Colonoscopy; Total knee arthroplasty (Right, 06/27/2017); Breast lumpectomy with radioactive seed and sentinel lymph node biopsy (Left, 03/05/2018); Eye surgery (Bilateral); Esophagogastroduodenoscopy (egd) with propofol  (N/A, 03/03/2020); Balloon dilation (N/A, 03/03/2020); biopsy (03/03/2020); and Esophageal brushing (03/03/2020).   Her family history is not on file. She was adopted.She reports that she has been smoking cigarettes. She has a 27.5 pack-year smoking history. She has never used smokeless tobacco. She reports that she does not drink alcohol and does not use drugs.    ROS Review of Systems  Constitutional: Negative.   HENT: Negative.    Eyes:  Negative for visual disturbance.   Respiratory:  Negative for shortness of breath.   Cardiovascular:  Negative for chest pain.  Gastrointestinal:  Negative for abdominal pain.  Musculoskeletal:  Positive for gait problem. Negative for arthralgias.    Objective:  BP 124/79   Pulse 88   Temp 97.9 F (36.6 C)   Ht 5\' 5"  (1.651 m)   Wt 146 lb (66.2 kg)   SpO2 95%   BMI 24.30 kg/m   BP Readings from Last 3 Encounters:  07/03/23 124/79  06/08/23 135/81  05/30/23 125/72    Wt Readings from Last 3 Encounters:  07/03/23 146 lb (66.2 kg)  06/08/23 146 lb 12.8 oz (66.6 kg)  05/30/23 143 lb (64.9 kg)     Physical Exam Constitutional:      General: She is not in acute distress.    Appearance: She is well-developed.  Cardiovascular:     Rate and Rhythm: Normal rate and regular rhythm.  Pulmonary:     Breath sounds: Normal breath sounds.  Musculoskeletal:        General: Normal range of motion.  Skin:    General: Skin is warm and dry.  Neurological:     Mental Status: She is alert and oriented to person, place, and time.      Assessment & Plan:  Primary osteoarthritis of left knee -     Magnesium ; Chew 200 mg by mouth 2 (two) times daily.  COPD  GOLD 4  Neuropathy -     Walker rolling -     Pregabalin ; 1 qhs  X7 days , then 2 qhs X 7d, then 3 qhs X 7d, then 4 qhs  Dispense: 120 capsule; Refill: 1  Other orders -     Triamterene -HCTZ; Take 1 tablet by mouth daily. For blood pressure and fluid  Dispense: 90 tablet; Refill: 3  The 2.2 cm ground glass opacity and lung nodule seen on CT were reviewed and pt. One noted to be hypermetabolic on PET. Will continue to follow with Dr. Waymond Hailey of pulmonology. One year imaging follow up recommended.    Follow-up: Return in about 1 month (around 08/03/2023).  Roise Cleaver, M.D.

## 2023-07-09 ENCOUNTER — Encounter: Payer: Self-pay | Admitting: Family Medicine

## 2023-07-10 ENCOUNTER — Encounter: Payer: Self-pay | Admitting: Family Medicine

## 2023-07-10 DIAGNOSIS — Z9981 Dependence on supplemental oxygen: Secondary | ICD-10-CM | POA: Diagnosis not present

## 2023-07-10 DIAGNOSIS — Z170 Estrogen receptor positive status [ER+]: Secondary | ICD-10-CM | POA: Diagnosis not present

## 2023-07-10 DIAGNOSIS — I2510 Atherosclerotic heart disease of native coronary artery without angina pectoris: Secondary | ICD-10-CM | POA: Diagnosis not present

## 2023-07-10 DIAGNOSIS — C3411 Malignant neoplasm of upper lobe, right bronchus or lung: Secondary | ICD-10-CM | POA: Diagnosis not present

## 2023-07-10 DIAGNOSIS — J449 Chronic obstructive pulmonary disease, unspecified: Secondary | ICD-10-CM | POA: Diagnosis not present

## 2023-07-10 DIAGNOSIS — C50412 Malignant neoplasm of upper-outer quadrant of left female breast: Secondary | ICD-10-CM | POA: Diagnosis not present

## 2023-07-10 DIAGNOSIS — Z9010 Acquired absence of unspecified breast and nipple: Secondary | ICD-10-CM | POA: Diagnosis not present

## 2023-07-10 DIAGNOSIS — F17210 Nicotine dependence, cigarettes, uncomplicated: Secondary | ICD-10-CM | POA: Diagnosis not present

## 2023-07-11 DIAGNOSIS — F17210 Nicotine dependence, cigarettes, uncomplicated: Secondary | ICD-10-CM | POA: Diagnosis not present

## 2023-07-11 DIAGNOSIS — I2510 Atherosclerotic heart disease of native coronary artery without angina pectoris: Secondary | ICD-10-CM | POA: Diagnosis not present

## 2023-07-11 DIAGNOSIS — C3411 Malignant neoplasm of upper lobe, right bronchus or lung: Secondary | ICD-10-CM | POA: Diagnosis not present

## 2023-07-11 DIAGNOSIS — Z9981 Dependence on supplemental oxygen: Secondary | ICD-10-CM | POA: Diagnosis not present

## 2023-07-11 DIAGNOSIS — J449 Chronic obstructive pulmonary disease, unspecified: Secondary | ICD-10-CM | POA: Diagnosis not present

## 2023-07-11 DIAGNOSIS — C50412 Malignant neoplasm of upper-outer quadrant of left female breast: Secondary | ICD-10-CM | POA: Diagnosis not present

## 2023-07-11 DIAGNOSIS — Z170 Estrogen receptor positive status [ER+]: Secondary | ICD-10-CM | POA: Diagnosis not present

## 2023-07-11 DIAGNOSIS — Z9010 Acquired absence of unspecified breast and nipple: Secondary | ICD-10-CM | POA: Diagnosis not present

## 2023-07-12 DIAGNOSIS — I2510 Atherosclerotic heart disease of native coronary artery without angina pectoris: Secondary | ICD-10-CM | POA: Diagnosis not present

## 2023-07-12 DIAGNOSIS — J449 Chronic obstructive pulmonary disease, unspecified: Secondary | ICD-10-CM | POA: Diagnosis not present

## 2023-07-12 DIAGNOSIS — Z9981 Dependence on supplemental oxygen: Secondary | ICD-10-CM | POA: Diagnosis not present

## 2023-07-12 DIAGNOSIS — F17210 Nicotine dependence, cigarettes, uncomplicated: Secondary | ICD-10-CM | POA: Diagnosis not present

## 2023-07-12 DIAGNOSIS — C3411 Malignant neoplasm of upper lobe, right bronchus or lung: Secondary | ICD-10-CM | POA: Diagnosis not present

## 2023-07-12 DIAGNOSIS — C50412 Malignant neoplasm of upper-outer quadrant of left female breast: Secondary | ICD-10-CM | POA: Diagnosis not present

## 2023-07-12 DIAGNOSIS — Z170 Estrogen receptor positive status [ER+]: Secondary | ICD-10-CM | POA: Diagnosis not present

## 2023-07-12 DIAGNOSIS — Z9010 Acquired absence of unspecified breast and nipple: Secondary | ICD-10-CM | POA: Diagnosis not present

## 2023-07-14 DIAGNOSIS — C3411 Malignant neoplasm of upper lobe, right bronchus or lung: Secondary | ICD-10-CM | POA: Diagnosis not present

## 2023-07-14 DIAGNOSIS — Z9010 Acquired absence of unspecified breast and nipple: Secondary | ICD-10-CM | POA: Diagnosis not present

## 2023-07-14 DIAGNOSIS — C50412 Malignant neoplasm of upper-outer quadrant of left female breast: Secondary | ICD-10-CM | POA: Diagnosis not present

## 2023-07-14 DIAGNOSIS — Z170 Estrogen receptor positive status [ER+]: Secondary | ICD-10-CM | POA: Diagnosis not present

## 2023-07-14 DIAGNOSIS — F17210 Nicotine dependence, cigarettes, uncomplicated: Secondary | ICD-10-CM | POA: Diagnosis not present

## 2023-07-14 DIAGNOSIS — I2510 Atherosclerotic heart disease of native coronary artery without angina pectoris: Secondary | ICD-10-CM | POA: Diagnosis not present

## 2023-07-14 DIAGNOSIS — J449 Chronic obstructive pulmonary disease, unspecified: Secondary | ICD-10-CM | POA: Diagnosis not present

## 2023-07-14 DIAGNOSIS — Z9981 Dependence on supplemental oxygen: Secondary | ICD-10-CM | POA: Diagnosis not present

## 2023-07-17 DIAGNOSIS — C3411 Malignant neoplasm of upper lobe, right bronchus or lung: Secondary | ICD-10-CM | POA: Diagnosis not present

## 2023-07-17 DIAGNOSIS — Z170 Estrogen receptor positive status [ER+]: Secondary | ICD-10-CM | POA: Diagnosis not present

## 2023-07-17 DIAGNOSIS — I2510 Atherosclerotic heart disease of native coronary artery without angina pectoris: Secondary | ICD-10-CM | POA: Diagnosis not present

## 2023-07-17 DIAGNOSIS — J449 Chronic obstructive pulmonary disease, unspecified: Secondary | ICD-10-CM | POA: Diagnosis not present

## 2023-07-17 DIAGNOSIS — Z9010 Acquired absence of unspecified breast and nipple: Secondary | ICD-10-CM | POA: Diagnosis not present

## 2023-07-17 DIAGNOSIS — F17210 Nicotine dependence, cigarettes, uncomplicated: Secondary | ICD-10-CM | POA: Diagnosis not present

## 2023-07-17 DIAGNOSIS — Z9981 Dependence on supplemental oxygen: Secondary | ICD-10-CM | POA: Diagnosis not present

## 2023-07-17 DIAGNOSIS — C50412 Malignant neoplasm of upper-outer quadrant of left female breast: Secondary | ICD-10-CM | POA: Diagnosis not present

## 2023-07-19 DIAGNOSIS — C3411 Malignant neoplasm of upper lobe, right bronchus or lung: Secondary | ICD-10-CM | POA: Diagnosis not present

## 2023-07-19 DIAGNOSIS — Z9010 Acquired absence of unspecified breast and nipple: Secondary | ICD-10-CM | POA: Diagnosis not present

## 2023-07-19 DIAGNOSIS — I2510 Atherosclerotic heart disease of native coronary artery without angina pectoris: Secondary | ICD-10-CM | POA: Diagnosis not present

## 2023-07-19 DIAGNOSIS — Z9981 Dependence on supplemental oxygen: Secondary | ICD-10-CM | POA: Diagnosis not present

## 2023-07-19 DIAGNOSIS — F17210 Nicotine dependence, cigarettes, uncomplicated: Secondary | ICD-10-CM | POA: Diagnosis not present

## 2023-07-19 DIAGNOSIS — Z170 Estrogen receptor positive status [ER+]: Secondary | ICD-10-CM | POA: Diagnosis not present

## 2023-07-19 DIAGNOSIS — J449 Chronic obstructive pulmonary disease, unspecified: Secondary | ICD-10-CM | POA: Diagnosis not present

## 2023-07-19 DIAGNOSIS — C50412 Malignant neoplasm of upper-outer quadrant of left female breast: Secondary | ICD-10-CM | POA: Diagnosis not present

## 2023-07-21 DIAGNOSIS — F17210 Nicotine dependence, cigarettes, uncomplicated: Secondary | ICD-10-CM | POA: Diagnosis not present

## 2023-07-21 DIAGNOSIS — I2510 Atherosclerotic heart disease of native coronary artery without angina pectoris: Secondary | ICD-10-CM | POA: Diagnosis not present

## 2023-07-21 DIAGNOSIS — Z170 Estrogen receptor positive status [ER+]: Secondary | ICD-10-CM | POA: Diagnosis not present

## 2023-07-21 DIAGNOSIS — J449 Chronic obstructive pulmonary disease, unspecified: Secondary | ICD-10-CM | POA: Diagnosis not present

## 2023-07-21 DIAGNOSIS — Z9010 Acquired absence of unspecified breast and nipple: Secondary | ICD-10-CM | POA: Diagnosis not present

## 2023-07-21 DIAGNOSIS — Z9981 Dependence on supplemental oxygen: Secondary | ICD-10-CM | POA: Diagnosis not present

## 2023-07-21 DIAGNOSIS — C3411 Malignant neoplasm of upper lobe, right bronchus or lung: Secondary | ICD-10-CM | POA: Diagnosis not present

## 2023-07-21 DIAGNOSIS — C50412 Malignant neoplasm of upper-outer quadrant of left female breast: Secondary | ICD-10-CM | POA: Diagnosis not present

## 2023-07-23 NOTE — Progress Notes (Unsigned)
 Joanne Kramer, female    DOB: 1945/07/08    MRN: [id number]   Brief patient profile:  55  yowf  active smoker from [facility]  self- referred to pulmonary clinic in [place]  01/18/2023  for COPD  GOLD 4 / 02 dep   History of Present Illness  01/18/2023  Pulmonary/ 1st office eval/ Barbette Mcglaun / [place] Office breztri   Chief Complaint  Patient presents with   Shortness of Breath   COPD  Dyspnea:  [employer] on 4lpm  Cough: mod rattling smoker's cough > light yellow not worse  Sleep: flat bed one pillow  SABA use: thinks combivent  works the best  02: 4lpm  Lung cancer screen: referred today  Rec Make sure you check your oxygen  saturation  AT  your highest level of activity (not after you stop)   to be sure it stays over 90% and adjust  02 flow upward to maintain this level if needed but remember to turn it back to previous settings when you stop (to conserve your supply).  Plan A = Automatic = Always=    stiolto 2 puffs each am  Plan B = Backup (to supplement plan A, not to replace it) Only use your combivent  inhaler as a rescue medication  Plan C = Crisis (instead of Plan B but only if Plan B stops working) - only use your albuterol  nebulizer if you first try Plan B  Also  Ok to try albuterol  15 min before an activity (on alternating days)  that you know would usually make you short of breath  Prednisone  10 mg take  4 each am x 2 days,   2 each am x 2 days,  1 each am x 2 days and stop    Allergy screen 01/18/2023 >  Eos 0.1  / alpha one phenotype MM  level 190    Please schedule a follow up visit in 3 months but call sooner if needed  with all medications /inhalers/ solutions in hand    04/24/2023  f/u ov/[place] office/Shivani Barrantes re: SPN  maint on stiolto  with Pos SPN/ Pos PET   Chief Complaint  Patient presents with   Follow-up  C shaped pain on L around T8  Dyspnea:  [employer] lion Cough: smoker's rattle beige  Sleeping: bed is flat s    resp cc  SABA use:  combivent  prn  02:3- 4lpm  Rec My office will be contacting you by phone for referral to [facility] for Super D CT   The key is to stop smoking completely before smoking completely stops you!  [place] rec empirical SBRT 06/08/23 >>> last RT Jul 21 2023   07/25/2023  3 m f/u ov/[place] office/Teriann Livingood re: GOLD 4  maint on stiolto   Chief Complaint  Patient presents with   Shortness of Breath   COPD  Chest pain is gone   Dyspnea:  about the same  Cough: rattle / junky sounding with  minimal yellow mucus = baseline  Sleeping: flat bed / one pillow  SABA use: decreased need since on stiolto still needs around 4pm and bedtime  Not needing neb  02: 4lpm    No obvious day to day or daytime variability or assoc mucus plugs or hemoptysis or cp or chest tightness, subjective wheeze or overt sinus or hb symptoms.    Also denies any obvious fluctuation of symptoms with weather or environmental changes or other aggravating or alleviating factors except as outlined above  No unusual exposure hx or h/o childhood pna/ asthma or knowledge of premature birth.  Current Allergies, Complete Past Medical History, Past Surgical History, Family History, and Social History were reviewed in [employer] record.  ROS  The following are not active complaints unless bolded Hoarseness, sore throat, dysphagia, dental problems, itching, sneezing,  nasal congestion or discharge of excess mucus or purulent secretions, ear ache,   fever, chills, sweats, unintended wt loss or wt gain, classically pleuritic or exertional cp,  orthopnea pnd or arm/hand swelling  or leg swelling, presyncope, palpitations, abdominal pain, anorexia, nausea, vomiting, diarrhea  or change in bowel habits or change in bladder habits, change in stools or change in urine, dysuria, hematuria,  rash, arthralgias, visual complaints, headache, numbness, weakness or ataxia or problems with walking or coordination,  change in mood or   memory.        Current Meds  Medication Sig   albuterol  (PROVENTIL ) (2.5 MG/3ML) 0.083% nebulizer solution Take 3 mLs (2.5 mg total) by nebulization every 6 (six) hours as needed for wheezing or shortness of breath.   calcium  carbonate (TUMS EX) 750 MG chewable tablet Chew 750-1,500 mg by mouth 3 (three) times daily as needed (for heartburn/indigestion.).   Ipratropium-Albuterol  (COMBIVENT  RESPIMAT) 20-100 MCG/ACT AERS respimat Inhale 1 puff into the lungs every 6 (six) hours as needed for wheezing.   Magnesium  200 MG CHEW Chew 200 mg by mouth 2 (two) times daily.   Melatonin 5 MG TABS Take 5 mg by mouth at bedtime as needed (for sleep.).    Multiple Minerals-Vitamins (BONE ESSENTIALS) CAPS Take 1 tablet by mouth daily.   Multiple Vitamin (MULTIVITAMIN WITH MINERALS) TABS tablet Take 1 tablet by mouth daily.   naproxen sodium (ALEVE) 220 MG tablet Take 440 mg by mouth daily as needed (pain).   OXYGEN  Inhale 2 L into the lungs at bedtime. Bedtime and as needed   pregabalin  (LYRICA ) 50 MG capsule 1 qhs X7 days , then 2 qhs X 7d, then 3 qhs X 7d, then 4 qhs   Tiotropium Bromide-Olodaterol (STIOLTO RESPIMAT ) 2.5-2.5 MCG/ACT AERS Inhale 2 puffs into the lungs daily.   triamcinolone  cream (KENALOG ) 0.1 % Apply 1 application topically 2 (two) times daily as needed (for skin irritation/contact dermatitis).   triamterene -hydrochlorothiazide (MAXZIDE-25) 37.5-25 MG tablet Take 1 tablet by mouth daily. For blood pressure and fluid   vitamin B-12 (CYANOCOBALAMIN ) 1000 MCG tablet Take 1,000 mcg by mouth daily.   vitamin C (ASCORBIC ACID) 250 MG tablet Take 250 mg by mouth daily.               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   OA  (osteoarthritis) of knee    right   Pneumonia    Requires supplemental oxygen        Objective:    Wts  07/25/2023       140   04/24/2023       151   01/18/23 149 lb (67.6 kg)  06/28/22 153 lb (69.4 kg)  02/17/22 164 lb (74.4 kg)    Vital signs reviewed  07/25/2023  - Note at rest 02 sats  90% on 4lpm POC   General  appearance:    amb wf and    HEENT :  Oropharynx  clear   Nasal turbinates nl    NECK :  without JVD/Nodes/TM/ nl carotid upstrokes bilaterally   LUNGS: no acc muscle use,  Mod barrel  contour chest wall with bilateral  Distant bs s audible wheeze and  without cough on insp or exp maneuvers and mod  Hyperresonant  to  percussion bilaterally     CV:  RRR  no s3 or murmur or increase in P2, and no edema   ABD:  soft and nontender    MS:   Ext warm without deformities or   obvious joint restrictions , calf tenderness, cyanosis or clubbing  SKIN: warm and dry without lesions    NEURO:  alert, approp, nl sensorium with  no motor or cerebellar deficits apparent.                  Assessment

## 2023-07-25 ENCOUNTER — Encounter: Payer: Self-pay | Admitting: Internal Medicine

## 2023-07-25 ENCOUNTER — Ambulatory Visit: Payer: Medicare Other | Admitting: Internal Medicine

## 2023-07-25 VITALS — BP 110/75 | HR 110 | Ht 65.0 in | Wt 140.0 lb

## 2023-07-25 DIAGNOSIS — J9612 Chronic respiratory failure with hypercapnia: Secondary | ICD-10-CM

## 2023-07-25 DIAGNOSIS — J9611 Chronic respiratory failure with hypoxia: Secondary | ICD-10-CM | POA: Diagnosis not present

## 2023-07-25 DIAGNOSIS — Z720 Tobacco use: Secondary | ICD-10-CM

## 2023-07-25 DIAGNOSIS — J449 Chronic obstructive pulmonary disease, unspecified: Secondary | ICD-10-CM

## 2023-07-25 DIAGNOSIS — R911 Solitary pulmonary nodule: Secondary | ICD-10-CM

## 2023-07-25 DIAGNOSIS — F17210 Nicotine dependence, cigarettes, uncomplicated: Secondary | ICD-10-CM

## 2023-07-25 NOTE — Patient Instructions (Signed)
 Congratulations on not smoking   Work on inhaler technique:  relax and gently blow all the way out then take a nice smooth full deep breath back in, triggering the inhaler at same time you start breathing in.  Hold breath in for at least  5 seconds if you can.    Please schedule a follow up office visit in 6 weeks, call sooner if needed

## 2023-07-26 NOTE — Assessment & Plan Note (Addendum)
 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 1.6 x 1.5 cm posterior right upper lobe pulmonary nodule is similar to prior and contiguous with the major fissure. This lesion was hypermetabolic on recent PET-CT. 2. Stable 2.2 cm ground-glass opacity in the right lower lobe although this is progressive since 08/29/2019. Continued close follow-up warranted. 3. Stable 7 mm right lower lobe pulmonary nodule comparing back to 08/29/2019, consistent with benign etiology. - 06/08/23 referred to RT empirically by Dr Baldwin Levee (s tissue dx)> completed Jul 21 2023 and tol well   F/u here q 3 m, sooner prn          Each maintenance medication was reviewed in detail including emphasizing most importantly the difference between maintenance and prns and under what circumstances the prns are to be triggered using an action plan format where appropriate.  Total time for H and P, chart review, counseling, reviewing 02 /pulse ox/smi/hfa/neb  device(s) and generating customized AVS unique to this office visit / same day charting = 26 min

## 2023-07-26 NOTE — Assessment & Plan Note (Signed)
 HC03   02/17/22  = 39 01/18/2023   Walked on 4lpm poc   x  3  lap(s) =  approx 450  ft  @ mod pace, stopped due to end of study  with lowest 02 sats 93% and mild sob at end   - HC03  33  01/18/23   HC03 down a bit good sign,  no change in recs

## 2023-07-26 NOTE — Assessment & Plan Note (Addendum)
 Congratulated on not smoking - reinforced maintaining abstinence.

## 2023-07-26 NOTE — Assessment & Plan Note (Addendum)
 Active smoker/MM - PFT's  08/20/19  FEV1 0.67 (29 % ) ratio 0.42  p 0 % improvement from saba p breztri  prior to study with DLCO  9.10 (45%)   and FV curve classsically concave    - Allergy screen 01/18/2023 >  Eos 0.1  / alpha one phenotype MM  level 190  - 01/18/2023   change breztri  to stiolto plus  saba - 07/25/2023  After extensive coaching inhaler device,  effectiveness =  80% with SMI so continue stiolto and approp saba  Re SABA :  I spent extra time with pt today reviewing appropriate use of albuterol  for prn use on exertion with the following points: 1) saba is for relief of sob that does not improve by walking a slower pace or resting but rather if the pt does not improve after trying this first. 2) If the pt is convinced, as many are, that saba helps recover from activity faster then it's easy to tell if this is the case by re-challenging : ie stop, take the inhaler, then p 5 minutes try the exact same activity (intensity of workload) that just caused the symptoms and see if they are substantially diminished or not after saba 3) if there is an activity that reproducibly causes the symptoms, try the saba 15 min before the activity on alternate days   If in fact the saba really does help, then fine to continue to use it prn but advised may need to look closer at the maintenance regimen being used to achieve better control of airways disease with exertion.

## 2023-07-27 ENCOUNTER — Other Ambulatory Visit: Payer: Self-pay | Admitting: Internal Medicine

## 2023-08-03 ENCOUNTER — Encounter: Payer: Self-pay | Admitting: Family Medicine

## 2023-08-03 ENCOUNTER — Ambulatory Visit: Admitting: Family Medicine

## 2023-08-09 ENCOUNTER — Encounter: Payer: Self-pay | Admitting: Family Medicine

## 2023-08-09 ENCOUNTER — Ambulatory Visit (INDEPENDENT_AMBULATORY_CARE_PROVIDER_SITE_OTHER): Admitting: Family Medicine

## 2023-08-09 VITALS — BP 99/65 | HR 112 | Temp 98.3°F | Ht 65.0 in | Wt 135.0 lb

## 2023-08-09 DIAGNOSIS — J449 Chronic obstructive pulmonary disease, unspecified: Secondary | ICD-10-CM | POA: Diagnosis not present

## 2023-08-09 DIAGNOSIS — C3411 Malignant neoplasm of upper lobe, right bronchus or lung: Secondary | ICD-10-CM

## 2023-08-09 DIAGNOSIS — E559 Vitamin D deficiency, unspecified: Secondary | ICD-10-CM | POA: Diagnosis not present

## 2023-08-09 DIAGNOSIS — R5383 Other fatigue: Secondary | ICD-10-CM | POA: Diagnosis not present

## 2023-08-09 MED ORDER — TRAZODONE HCL 50 MG/5ML PO SOLN: 100.0000 mg | Freq: Every day | ORAL | 5 refills | Status: DC

## 2023-08-09 MED ORDER — MEGESTROL ACETATE 400 MG/10ML PO SUSP
400.0000 mg | Freq: Two times a day (BID) | ORAL | 2 refills | Status: DC
Start: 2023-08-09 — End: 2023-08-22

## 2023-08-09 NOTE — Patient Instructions (Signed)
 Use protein shakes two to four a day between meals. I prefer Pulmocare, Ensure and Boost for you.

## 2023-08-09 NOTE — Progress Notes (Signed)
 Subjective:  Patient ID: Joanne Kramer, female    DOB: Jul 09, 1945  Age: 78 y.o. MRN: [id number]  CC: Medical Management of Chronic Issues (Ongoing sleep issue. Able to fall asleep but wont stay asleep. Sleeps a lot during the day according to husband.), medication issue (Stopped gabapentin  and maxzide (new meds) was causing nauasea and lack of appetite so pt stopped both meds. ), and Dizziness (Dizzy often. Having disorientation on and off. Fatigue and weight less. All started 6 months ago. )   HPI Joanne Kramer presents for folow up on new meds. Had to DC both of the new ones I had prescribed.  She, felt terrible with pregab.  And Triam/HCTZ made her very dizzy.  Phlegm collects in her throat in the night and she awakens dyspneic in about 3 hours. Then sleeps of and on until Noon. Weak all the time. Using O2 4L [place] [employer].  Her significant other states that she sleeps up to 16 hours a day.  However this is interrupted multiple times through the night and day.  The dizziness is lightheadedness it is not related to position or movement.  There is no room spinning sensation noted.  She reports that she just finished radiation for her lung cancer.  They could not do surgery due to its position.     08/09/2023    1:40 PM 07/03/2023    2:04 PM 09/28/2021    9:38 AM  Depression screen PHQ 2/9  Decreased Interest 1 1 1   Down, Depressed, Hopeless 0 1 0  PHQ - 2 Score 1 2 1   Altered sleeping 3 2 3   Tired, decreased energy 3 1 1   Change in appetite 2 1 0  Feeling bad or failure about yourself  1 0 0  Trouble concentrating 0 0 0  Moving slowly or fidgety/restless 0 0 0  Suicidal thoughts 0 0 0  PHQ-9 Score 10 6 5   Difficult doing work/chores Very difficult Somewhat difficult Somewhat difficult    History Joanne Kramer has a past medical history of Breast cancer (HCC), Contact dermatitis, COPD (chronic obstructive pulmonary disease) (HCC), Emphysema of lung (HCC), Headache, 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   She has a past surgical history that includes Cesarean section; Knee surgery (Right); Tonsillectomy; Breast surgery; Colonoscopy; Total knee arthroplasty (Right, 06/27/2017); Breast lumpectomy with radioactive seed and sentinel lymph node biopsy (Left, 03/05/2018); Eye surgery (Bilateral); Esophagogastroduodenoscopy (egd) with propofol  (N/A, 03/03/2020); Balloon dilation (N/A, 03/03/2020); biopsy (03/03/2020); and Esophageal brushing (03/03/2020).   Her family history is not on file. She was adopted.She reports that she has been smoking cigarettes. She has a 27.5 pack-year smoking history. She has never used smokeless tobacco. She reports that she does not drink alcohol and does not use drugs.    ROS Review of Systems  Constitutional: Negative.   HENT:  Negative for congestion.   Eyes:  Negative for visual disturbance.  Respiratory:  Negative for shortness of breath.   Cardiovascular:  Negative for chest pain.  Gastrointestinal:  Negative for abdominal pain, constipation, diarrhea, nausea and vomiting.  Genitourinary:  Negative for difficulty urinating.  Musculoskeletal:  Negative for arthralgias and myalgias.  Neurological:  Negative for headaches.  Psychiatric/Behavioral:  Negative for sleep disturbance.     Objective:  BP 99/65   Pulse (!) 112   Temp 98.3 F (36.8 C)   Ht 5' 5 (1.651 m)   Wt 135 lb (61.2 kg)  SpO2 92%   BMI 22.47 kg/m   BP Readings from Last 3 Encounters:  08/09/23 99/65  07/25/23 110/75  07/03/23 124/79    Wt Readings from Last 3 Encounters:  08/09/23 135 lb (61.2 kg)  07/25/23 140 lb (63.5 kg)  07/03/23 146 lb (66.2 kg)     Physical Exam Constitutional:      General: She is not in acute distress.    Appearance: She is well-developed.  HENT:     Head: Normocephalic and atraumatic.   Eyes:     Conjunctiva/sclera: Conjunctivae normal.     Pupils: Pupils  are equal, round, and reactive to light.   Neck:     Thyroid : No thyromegaly.   Cardiovascular:     Rate and Rhythm: Normal rate and regular rhythm.     Heart sounds: Normal heart sounds. No murmur heard. Pulmonary:     Effort: Pulmonary effort is normal. No respiratory distress.     Breath sounds: Normal breath sounds. No wheezing or rales.  Abdominal:     General: Bowel sounds are normal. There is no distension.     Palpations: Abdomen is soft.     Tenderness: There is no abdominal tenderness.   Musculoskeletal:        General: Normal range of motion.     Cervical back: Normal range of motion and neck supple.  Lymphadenopathy:     Cervical: No cervical adenopathy.   Skin:    General: Skin is warm and dry.   Neurological:     Mental Status: She is alert and oriented to person, place, and time.   Psychiatric:        Behavior: Behavior normal.        Thought Content: Thought content normal.        Judgment: Judgment normal.      Assessment & Plan:  COPD  GOLD 4 -     CBC with Differential/Platelet -     CMP14+EGFR  Malignant neoplasm of upper lobe of right lung (HCC) -     CBC with Differential/Platelet -     CMP14+EGFR  Fatigue, unspecified type -     Vitamin B12 -     VITAMIN D  25 Hydroxy (Vit-D Deficiency, Fractures) -     CBC with Differential/Platelet -     CMP14+EGFR -     TSH  Vitamin D  deficiency -     VITAMIN D  25 Hydroxy (Vit-D Deficiency, Fractures)  Other orders -     traZODone  HCl; Take 100 mg by mouth at bedtime. For sleep  Dispense: 300 mL; Refill: 5 -     Megestrol  Acetate; Take 10 mLs (400 mg total) by mouth 2 (two) times daily. For appetite stimulation  Dispense: 600 mL; Refill: 2   I explained to her that I am concerned that the fatigue and dizziness are related to her recent bout of therapy for lung cancer and or due to the cancer itself.  It is not clear whether she is actually in remission or not at this point.  Follow-up: Return in  about 1 month (around 09/08/2023) for COPD.  Roise Cleaver, M.D.

## 2023-08-10 LAB — CBC WITH DIFFERENTIAL/PLATELET
Basophils Absolute: 0.1 10*3/uL (ref 0.0–0.2)
Basos: 1 %
EOS (ABSOLUTE): 0.1 10*3/uL (ref 0.0–0.4)
Eos: 3 %
Hematocrit: 36.1 % (ref 34.0–46.6)
Hemoglobin: 11.3 g/dL (ref 11.1–15.9)
Immature Grans (Abs): 0 10*3/uL (ref 0.0–0.1)
Immature Granulocytes: 0 %
Lymphocytes Absolute: 0.9 10*3/uL (ref 0.7–3.1)
Lymphs: 22 %
MCH: 31 pg (ref 26.6–33.0)
MCHC: 31.3 g/dL — ABNORMAL LOW (ref 31.5–35.7)
MCV: 99 fL — ABNORMAL HIGH (ref 79–97)
Monocytes Absolute: 0.6 10*3/uL (ref 0.1–0.9)
Monocytes: 14 %
Neutrophils Absolute: 2.6 10*3/uL (ref 1.4–7.0)
Neutrophils: 60 %
Platelets: 189 10*3/uL (ref 150–450)
RBC: 3.64 x10E6/uL — ABNORMAL LOW (ref 3.77–5.28)
RDW: 13.7 % (ref 11.7–15.4)
WBC: 4.2 10*3/uL (ref 3.4–10.8)

## 2023-08-10 LAB — CMP14+EGFR
ALT: 14 IU/L (ref 0–32)
AST: 21 IU/L (ref 0–40)
Albumin: 4.2 g/dL (ref 3.8–4.8)
Alkaline Phosphatase: 81 IU/L (ref 44–121)
BUN/Creatinine Ratio: 18 (ref 12–28)
BUN: 12 mg/dL (ref 8–27)
Bilirubin Total: 0.4 mg/dL (ref 0.0–1.2)
CO2: 34 mmol/L — ABNORMAL HIGH (ref 20–29)
Calcium: 9.6 mg/dL (ref 8.7–10.3)
Chloride: 89 mmol/L — ABNORMAL LOW (ref 96–106)
Creatinine, Ser: 0.65 mg/dL (ref 0.57–1.00)
Globulin, Total: 2.5 g/dL (ref 1.5–4.5)
Glucose: 97 mg/dL (ref 70–99)
Potassium: 4 mmol/L (ref 3.5–5.2)
Sodium: 141 mmol/L (ref 134–144)
Total Protein: 6.7 g/dL (ref 6.0–8.5)
eGFR: 91 mL/min/{1.73_m2} (ref >59)

## 2023-08-10 LAB — TSH: TSH: 0.559 u[IU]/mL (ref 0.450–4.500)

## 2023-08-10 LAB — VITAMIN B12: Vitamin B-12: 835 pg/mL (ref 232–1245)

## 2023-08-10 LAB — VITAMIN D 25 HYDROXY (VIT D DEFICIENCY, FRACTURES): Vit D, 25-Hydroxy: 48.1 ng/mL (ref 30.0–100.0)

## 2023-08-15 ENCOUNTER — Ambulatory Visit: Payer: Self-pay | Admitting: Family Medicine

## 2023-08-15 NOTE — Progress Notes (Signed)
Hello Dailey,  Your lab result is normal and/or stable.Some minor variations that are not significant are commonly marked abnormal, but do not represent any medical problem for you.  Best regards, Elham Fini, M.D.

## 2023-08-16 ENCOUNTER — Other Ambulatory Visit: Payer: Self-pay | Admitting: Family Medicine

## 2023-08-16 NOTE — Telephone Encounter (Signed)
 traZODone  (DESYREL ) 100 MG tablet        Changed from: traZODone  HCl 50 MG/5ML SOLN   Pharmacy comment: Product Backordered/Unavailable:TRAZODONE  SOLUTION IS ONLY SHOWING AVAILABLE AS BRAND RALDESY  AND CURRENTLY SHOWING UNAVAILABLE; PATIENT IS OK WITH TABLETS NOW SINCE THIS IS UNAVAILABLE; PLEASE RESEND FOR TABLE

## 2023-08-21 ENCOUNTER — Telehealth: Payer: Self-pay | Admitting: *Deleted

## 2023-08-21 ENCOUNTER — Telehealth: Payer: Self-pay

## 2023-08-21 NOTE — Telephone Encounter (Signed)
 Copied from CRM [id number]. Topic: General - Deceased Patient >> 2023-09-11  4:39 PM Delon DASEN wrote: Name of caller: Vickye Jarred  Date of death: 2023-09-10 12:35pm   Name of funeral home: [facility]  Phone number of funeral home: [phone]  Provider that needs to sign form: Dr. Zollie  Timeline for signing: ASAP- [place]

## 2023-08-21 NOTE — Telephone Encounter (Signed)
 Any information they can give us  about cause of death? I will do death certificate.

## 2023-08-21 NOTE — Telephone Encounter (Signed)
 Caller is Larraine with EMS, the patient is deceased and they would like to speak with the on call doctor to verify the death certificate. Spoke with Rosaline Bruns and she states she is not on call and to page [place] instead.

## 2023-08-21 NOTE — Telephone Encounter (Signed)
 Was called to house after boyfriend found her laying next to her bed on her left side, she still had her O2 @ 3L on, no obvious signs of trauma, she had not had any complaints that morning. Boyfriend had been outside since 9 am doing yard work. Law enforcement also on scene did not suspect foul play. Time of death yesterday was 1235 pm. Hx of COPD & lung Ca

## 2023-08-29 DEATH — deceased

## 2023-09-12 ENCOUNTER — Ambulatory Visit: Admitting: Family Medicine

## 2023-09-19 ENCOUNTER — Ambulatory Visit: Admitting: Internal Medicine

## 2023-09-26 NOTE — Telephone Encounter (Signed)
 SABRA
